# Patient Record
Sex: Female | Born: 1945 | Race: Black or African American | Hispanic: No | Smoking: Never smoker
Health system: Southern US, Community
[De-identification: ages and names within clinical notes are randomized; demographics above are authoritative.]

## PROBLEM LIST (undated history)

## (undated) DIAGNOSIS — M1990 Unspecified osteoarthritis, unspecified site: Secondary | ICD-10-CM

## (undated) DIAGNOSIS — I639 Cerebral infarction, unspecified: Secondary | ICD-10-CM

## (undated) DIAGNOSIS — J069 Acute upper respiratory infection, unspecified: Secondary | ICD-10-CM

## (undated) DIAGNOSIS — T783XXA Angioneurotic edema, initial encounter: Secondary | ICD-10-CM

## (undated) DIAGNOSIS — Z9109 Other allergy status, other than to drugs and biological substances: Secondary | ICD-10-CM

## (undated) DIAGNOSIS — I10 Essential (primary) hypertension: Secondary | ICD-10-CM

## (undated) DIAGNOSIS — I2510 Atherosclerotic heart disease of native coronary artery without angina pectoris: Secondary | ICD-10-CM

## (undated) DIAGNOSIS — J45909 Unspecified asthma, uncomplicated: Secondary | ICD-10-CM

## (undated) DIAGNOSIS — E785 Hyperlipidemia, unspecified: Secondary | ICD-10-CM

## (undated) DIAGNOSIS — Z8679 Personal history of other diseases of the circulatory system: Secondary | ICD-10-CM

## (undated) HISTORY — PX: TUBAL LIGATION: SHX77

## (undated) HISTORY — DX: Essential (primary) hypertension: I10

## (undated) HISTORY — DX: Personal history of other diseases of the circulatory system: Z86.79

## (undated) HISTORY — DX: Unspecified osteoarthritis, unspecified site: M19.90

## (undated) HISTORY — DX: Acute upper respiratory infection, unspecified: J06.9

## (undated) HISTORY — PX: CARDIAC CATHETERIZATION: SHX172

## (undated) HISTORY — DX: Angioneurotic edema, initial encounter: T78.3XXA

## (undated) HISTORY — DX: Atherosclerotic heart disease of native coronary artery without angina pectoris: I25.10

## (undated) HISTORY — DX: Unspecified asthma, uncomplicated: J45.909

---

## 2003-06-27 ENCOUNTER — Ambulatory Visit (HOSPITAL_COMMUNITY): Admission: RE | Admit: 2003-06-27 | Discharge: 2003-06-27 | Payer: Self-pay | Admitting: Obstetrics

## 2003-10-13 ENCOUNTER — Ambulatory Visit (HOSPITAL_COMMUNITY): Admission: RE | Admit: 2003-10-13 | Discharge: 2003-10-13 | Payer: Self-pay | Admitting: Obstetrics

## 2003-10-13 ENCOUNTER — Encounter (INDEPENDENT_AMBULATORY_CARE_PROVIDER_SITE_OTHER): Payer: Self-pay | Admitting: *Deleted

## 2003-10-22 ENCOUNTER — Inpatient Hospital Stay (HOSPITAL_COMMUNITY): Admission: EM | Admit: 2003-10-22 | Discharge: 2003-10-25 | Payer: Self-pay | Admitting: Emergency Medicine

## 2003-10-23 ENCOUNTER — Encounter (INDEPENDENT_AMBULATORY_CARE_PROVIDER_SITE_OTHER): Payer: Self-pay | Admitting: Interventional Cardiology

## 2003-10-30 ENCOUNTER — Inpatient Hospital Stay (HOSPITAL_COMMUNITY): Admission: AD | Admit: 2003-10-30 | Discharge: 2003-10-30 | Payer: Self-pay

## 2003-12-12 ENCOUNTER — Ambulatory Visit: Admission: RE | Admit: 2003-12-12 | Discharge: 2003-12-12 | Payer: Self-pay | Admitting: Internal Medicine

## 2003-12-15 ENCOUNTER — Inpatient Hospital Stay (HOSPITAL_COMMUNITY): Admission: RE | Admit: 2003-12-15 | Discharge: 2003-12-21 | Payer: Self-pay | Admitting: Obstetrics

## 2003-12-15 ENCOUNTER — Encounter (INDEPENDENT_AMBULATORY_CARE_PROVIDER_SITE_OTHER): Payer: Self-pay | Admitting: Specialist

## 2003-12-18 ENCOUNTER — Encounter: Payer: Self-pay | Admitting: Cardiology

## 2003-12-20 ENCOUNTER — Encounter: Payer: Self-pay | Admitting: Cardiovascular Disease

## 2003-12-20 ENCOUNTER — Encounter: Payer: Self-pay | Admitting: Neurology

## 2003-12-24 ENCOUNTER — Inpatient Hospital Stay (HOSPITAL_COMMUNITY): Admission: AD | Admit: 2003-12-24 | Discharge: 2003-12-24 | Payer: Self-pay | Admitting: Obstetrics & Gynecology

## 2004-02-05 ENCOUNTER — Encounter: Admission: RE | Admit: 2004-02-05 | Discharge: 2004-05-05 | Payer: Self-pay | Admitting: Neurology

## 2004-02-29 ENCOUNTER — Encounter: Admission: RE | Admit: 2004-02-29 | Discharge: 2004-02-29 | Payer: Self-pay | Admitting: Obstetrics

## 2004-12-25 ENCOUNTER — Emergency Department (HOSPITAL_COMMUNITY): Admission: EM | Admit: 2004-12-25 | Discharge: 2004-12-25 | Payer: Self-pay | Admitting: Emergency Medicine

## 2005-03-12 ENCOUNTER — Emergency Department (HOSPITAL_COMMUNITY): Admission: EM | Admit: 2005-03-12 | Discharge: 2005-03-12 | Payer: Self-pay | Admitting: Emergency Medicine

## 2005-07-29 ENCOUNTER — Ambulatory Visit: Payer: Self-pay | Admitting: Internal Medicine

## 2005-09-09 ENCOUNTER — Ambulatory Visit (HOSPITAL_COMMUNITY): Admission: RE | Admit: 2005-09-09 | Discharge: 2005-09-09 | Payer: Self-pay | Admitting: Obstetrics

## 2006-09-07 ENCOUNTER — Ambulatory Visit: Payer: Self-pay | Admitting: Internal Medicine

## 2006-09-07 LAB — CONVERTED CEMR LAB
ALT: 25 units/L (ref 0–40)
AST: 24 units/L (ref 0–37)
Albumin: 3.6 g/dL (ref 3.5–5.2)
Alkaline Phosphatase: 93 units/L (ref 39–117)
BUN: 8 mg/dL (ref 6–23)
CO2: 35 meq/L — ABNORMAL HIGH (ref 19–32)
Calcium: 9.2 mg/dL (ref 8.4–10.5)
Chloride: 106 meq/L (ref 96–112)
Creatinine, Ser: 0.9 mg/dL (ref 0.4–1.2)
GFR calc non Af Amer: 68 mL/min
Glomerular Filtration Rate, Af Am: 83 mL/min/{1.73_m2}
Glucose, Bld: 97 mg/dL (ref 70–99)
HCT: 37.5 % (ref 36.0–46.0)
Hemoglobin: 12.4 g/dL (ref 12.0–15.0)
MCHC: 33.1 g/dL (ref 30.0–36.0)
MCV: 82.1 fL (ref 78.0–100.0)
Platelets: 171 10*3/uL (ref 150–400)
Potassium: 4.1 meq/L (ref 3.5–5.1)
RBC: 4.57 M/uL (ref 3.87–5.11)
RDW: 14.1 % (ref 11.5–14.6)
Sodium: 145 meq/L (ref 135–145)
Total Bilirubin: 0.7 mg/dL (ref 0.3–1.2)
Total Protein: 6.1 g/dL (ref 6.0–8.3)
WBC: 5.2 10*3/uL (ref 4.5–10.5)

## 2007-04-15 ENCOUNTER — Encounter (INDEPENDENT_AMBULATORY_CARE_PROVIDER_SITE_OTHER): Payer: Self-pay

## 2007-04-16 ENCOUNTER — Ambulatory Visit: Payer: Self-pay | Admitting: Internal Medicine

## 2007-04-16 DIAGNOSIS — I10 Essential (primary) hypertension: Secondary | ICD-10-CM

## 2007-04-16 DIAGNOSIS — Z8679 Personal history of other diseases of the circulatory system: Secondary | ICD-10-CM | POA: Insufficient documentation

## 2007-04-16 DIAGNOSIS — J45909 Unspecified asthma, uncomplicated: Secondary | ICD-10-CM

## 2007-04-16 DIAGNOSIS — T783XXA Angioneurotic edema, initial encounter: Secondary | ICD-10-CM | POA: Insufficient documentation

## 2007-04-16 HISTORY — DX: Angioneurotic edema, initial encounter: T78.3XXA

## 2007-04-16 HISTORY — DX: Unspecified asthma, uncomplicated: J45.909

## 2007-04-16 HISTORY — DX: Essential (primary) hypertension: I10

## 2007-04-16 HISTORY — DX: Personal history of other diseases of the circulatory system: Z86.79

## 2007-04-20 ENCOUNTER — Telehealth (INDEPENDENT_AMBULATORY_CARE_PROVIDER_SITE_OTHER): Payer: Self-pay | Admitting: *Deleted

## 2007-05-04 ENCOUNTER — Ambulatory Visit: Payer: Self-pay | Admitting: Internal Medicine

## 2007-05-10 ENCOUNTER — Ambulatory Visit: Payer: Self-pay | Admitting: Internal Medicine

## 2007-05-10 DIAGNOSIS — IMO0002 Reserved for concepts with insufficient information to code with codable children: Secondary | ICD-10-CM

## 2007-05-11 ENCOUNTER — Telehealth (INDEPENDENT_AMBULATORY_CARE_PROVIDER_SITE_OTHER): Payer: Self-pay | Admitting: *Deleted

## 2007-05-14 ENCOUNTER — Ambulatory Visit: Payer: Self-pay | Admitting: Internal Medicine

## 2007-05-14 DIAGNOSIS — L508 Other urticaria: Secondary | ICD-10-CM | POA: Insufficient documentation

## 2007-06-14 ENCOUNTER — Ambulatory Visit: Payer: Self-pay | Admitting: Internal Medicine

## 2007-07-02 ENCOUNTER — Encounter: Payer: Self-pay | Admitting: Internal Medicine

## 2007-12-16 ENCOUNTER — Ambulatory Visit: Payer: Self-pay | Admitting: Internal Medicine

## 2007-12-16 DIAGNOSIS — R51 Headache: Secondary | ICD-10-CM

## 2007-12-16 DIAGNOSIS — R519 Headache, unspecified: Secondary | ICD-10-CM | POA: Insufficient documentation

## 2007-12-24 ENCOUNTER — Encounter: Payer: Self-pay | Admitting: Internal Medicine

## 2008-08-02 ENCOUNTER — Telehealth: Payer: Self-pay | Admitting: Internal Medicine

## 2008-08-09 ENCOUNTER — Ambulatory Visit: Payer: Self-pay | Admitting: Cardiology

## 2008-08-09 ENCOUNTER — Telehealth: Payer: Self-pay | Admitting: Internal Medicine

## 2008-08-09 ENCOUNTER — Ambulatory Visit: Payer: Self-pay | Admitting: Internal Medicine

## 2008-08-09 ENCOUNTER — Inpatient Hospital Stay (HOSPITAL_COMMUNITY): Admission: EM | Admit: 2008-08-09 | Discharge: 2008-08-15 | Payer: Self-pay | Admitting: Emergency Medicine

## 2008-08-10 ENCOUNTER — Encounter: Payer: Self-pay | Admitting: Internal Medicine

## 2008-08-12 ENCOUNTER — Ambulatory Visit: Payer: Self-pay | Admitting: Vascular Surgery

## 2008-08-12 ENCOUNTER — Encounter: Payer: Self-pay | Admitting: Internal Medicine

## 2008-08-29 ENCOUNTER — Ambulatory Visit: Payer: Self-pay | Admitting: Internal Medicine

## 2008-08-29 DIAGNOSIS — I2510 Atherosclerotic heart disease of native coronary artery without angina pectoris: Secondary | ICD-10-CM

## 2008-08-29 HISTORY — DX: Atherosclerotic heart disease of native coronary artery without angina pectoris: I25.10

## 2008-10-09 ENCOUNTER — Ambulatory Visit: Payer: Self-pay | Admitting: Internal Medicine

## 2009-01-10 ENCOUNTER — Ambulatory Visit: Payer: Self-pay | Admitting: Internal Medicine

## 2009-04-11 ENCOUNTER — Ambulatory Visit: Payer: Self-pay | Admitting: Internal Medicine

## 2009-09-12 ENCOUNTER — Other Ambulatory Visit: Admission: RE | Admit: 2009-09-12 | Discharge: 2009-09-12 | Payer: Self-pay | Admitting: Internal Medicine

## 2009-09-12 ENCOUNTER — Ambulatory Visit: Payer: Self-pay | Admitting: Internal Medicine

## 2009-09-12 LAB — CONVERTED CEMR LAB
ALT: 22 units/L (ref 0–35)
AST: 24 units/L (ref 0–37)
Albumin: 3.8 g/dL (ref 3.5–5.2)
Alkaline Phosphatase: 90 units/L (ref 39–117)
BUN: 16 mg/dL (ref 6–23)
Basophils Absolute: 0 10*3/uL (ref 0.0–0.1)
Basophils Relative: 0.5 % (ref 0.0–3.0)
Bilirubin, Direct: 0 mg/dL (ref 0.0–0.3)
CO2: 31 meq/L (ref 19–32)
Calcium: 9.2 mg/dL (ref 8.4–10.5)
Chloride: 106 meq/L (ref 96–112)
Cholesterol: 195 mg/dL (ref 0–200)
Creatinine, Ser: 0.8 mg/dL (ref 0.4–1.2)
Eosinophils Absolute: 0.3 10*3/uL (ref 0.0–0.7)
Eosinophils Relative: 5.7 % — ABNORMAL HIGH (ref 0.0–5.0)
GFR calc non Af Amer: 93.64 mL/min (ref >60)
Glucose, Bld: 92 mg/dL (ref 70–99)
HCT: 38.8 % (ref 36.0–46.0)
HDL: 45.3 mg/dL (ref >39.00)
Hemoglobin: 12.4 g/dL (ref 12.0–15.0)
LDL Cholesterol: 133 mg/dL — ABNORMAL HIGH (ref 0–99)
Lymphocytes Relative: 38.9 % (ref 12.0–46.0)
Lymphs Abs: 1.8 10*3/uL (ref 0.7–4.0)
MCHC: 32.1 g/dL (ref 30.0–36.0)
MCV: 86.3 fL (ref 78.0–100.0)
Monocytes Absolute: 0.3 10*3/uL (ref 0.1–1.0)
Monocytes Relative: 7.5 % (ref 3.0–12.0)
Neutro Abs: 2.2 10*3/uL (ref 1.4–7.7)
Neutrophils Relative %: 47.4 % (ref 43.0–77.0)
Platelets: 140 10*3/uL — ABNORMAL LOW (ref 150.0–400.0)
Potassium: 3.9 meq/L (ref 3.5–5.1)
RBC: 4.5 M/uL (ref 3.87–5.11)
RDW: 14.7 % — ABNORMAL HIGH (ref 11.5–14.6)
Sodium: 142 meq/L (ref 135–145)
TSH: 0.7 microintl units/mL (ref 0.35–5.50)
Total Bilirubin: 0.9 mg/dL (ref 0.3–1.2)
Total CHOL/HDL Ratio: 4
Total Protein: 6.7 g/dL (ref 6.0–8.3)
Triglycerides: 83 mg/dL (ref 0.0–149.0)
VLDL: 16.6 mg/dL (ref 0.0–40.0)
WBC: 4.6 10*3/uL (ref 4.5–10.5)

## 2009-09-19 ENCOUNTER — Encounter (INDEPENDENT_AMBULATORY_CARE_PROVIDER_SITE_OTHER): Payer: Self-pay | Admitting: *Deleted

## 2009-10-02 ENCOUNTER — Encounter (INDEPENDENT_AMBULATORY_CARE_PROVIDER_SITE_OTHER): Payer: Self-pay | Admitting: *Deleted

## 2009-10-03 ENCOUNTER — Ambulatory Visit: Payer: Self-pay | Admitting: Gastroenterology

## 2009-10-12 ENCOUNTER — Telehealth: Payer: Self-pay | Admitting: Gastroenterology

## 2009-10-15 ENCOUNTER — Ambulatory Visit: Payer: Self-pay | Admitting: Gastroenterology

## 2009-10-17 ENCOUNTER — Encounter: Payer: Self-pay | Admitting: Gastroenterology

## 2009-10-25 ENCOUNTER — Ambulatory Visit: Payer: Self-pay | Admitting: Internal Medicine

## 2009-10-25 DIAGNOSIS — J069 Acute upper respiratory infection, unspecified: Secondary | ICD-10-CM | POA: Insufficient documentation

## 2009-10-25 HISTORY — DX: Acute upper respiratory infection, unspecified: J06.9

## 2010-09-14 ENCOUNTER — Encounter: Payer: Self-pay | Admitting: Obstetrics

## 2010-09-15 ENCOUNTER — Encounter: Payer: Self-pay | Admitting: Internal Medicine

## 2010-09-24 NOTE — Assessment & Plan Note (Signed)
Summary: pt will come in fasting/njr/pt rsc/cjr   Vital Signs:  Patient profile:   65 year old female Weight:      240 pounds BP sitting:   130 / 80  (left arm) Cuff size:   large  Vitals Entered By: Raechel Ache, RN (September 12, 2009 9:24 AM) CC: OV, fasting.   CC:  OV and fasting.Marland Kitchen  History of Present Illness: 65 year old patient who is seen today for a wellness exam.  She has a history of coronary artery disease and status post non-ST segment elevation MI approximately 1 year ago.  Heart catheterization revealed minimal coronary artery disease.  She apparently is not on simvastatin.  She has treated hypertension and also a history of asthma.  She has a long history of chronic urticaria and occasional episodes of angioedema.  Her asthma has been stable.  She has a history of diminished visual acuity at workset.  [employer] in [place].  Preventive Screening-Counseling & Management  Caffeine-Diet-Exercise     Does Patient Exercise: no  Allergies: 1)  ! * Antibiotics  Past History:  Past Medical History: Asthma Hypertension Cerebrovascular accident, hx of Headache non-ST segment elevation MI 12 2009 minimal coronary artery disease statin therapy diminished visual acuity  Past Surgical History: G3 P3, A0,  heart catheterization 12 2009 revealing minimal coronary artery disease  colonoscopy-no prior  Family History: Reviewed history from 05/04/2007 and no changes required. father died age 64 drowning death.  Mother died age 48 outpatient to diabetes and breast cancer two brothers 5 sisters and positive forl hypertension and breast cancer, CAD  Social History: Reviewed history and no changes required. Married Regular exercise-no 3 children, 7 g-children works at [employer] in [place] Patient Exercise:  no  Review of Systems  The patient denies anorexia, fever, weight loss, weight gain, vision loss, decreased  hearing, hoarseness, chest pain, syncope, dyspnea on exertion, peripheral edema, prolonged cough, headaches, hemoptysis, abdominal pain, melena, hematochezia, severe indigestion/heartburn, hematuria, incontinence, genital sores, muscle weakness, suspicious skin lesions, transient blindness, difficulty walking, depression, unusual weight change, abnormal bleeding, enlarged lymph nodes, angioedema, and breast masses.         no recent mammogram  Physical Exam  General:  overweight-appearing. blood pressure is 140/80 Head:  diffuse thinning of her hair, most marked on the vertex Eyes:  diminished visual acuity  strabismus noted; there is external deviation of the right eye Ears:  External ear exam shows no significant lesions or deformities.  Otoscopic examination reveals clear canals, tympanic membranes are intact bilaterally without bulging, retraction, inflammation or discharge. Hearing is grossly normal bilaterally. Nose:  External nasal examination shows no deformity or inflammation. Nasal mucosa are pink and moist without lesions or exudates. Mouth:  unremarkable except for multiple missing teeth Neck:  No deformities, masses, or tenderness noted. Chest Wall:  No deformities, masses, or tenderness noted. Breasts:  No mass, nodules, thickening, tenderness, bulging, retraction, inflamation, nipple discharge or skin changes noted.   Lungs:  Normal respiratory effort, chest expands symmetrically. Lungs are clear to auscultation, no crackles or wheezes. Heart:  Normal rate and regular rhythm. S1 and S2 normal without gallop, murmur, click, rub or other extra sounds. Abdomen:  obese soft and nontender.  Lower midline scar.  No organomegaly or masses.  Bowel sounds normal Rectal:  No external abnormalities noted. Normal sphincter tone. No rectal masses or tenderness. Genitalia:  Normal introitus for age, no external lesions, no vaginal discharge, mucosa  pink and moist, no vaginal or cervical lesions,  no vaginal atrophy, no friaility or hemorrhage, normal uterus size and position, no adnexal masses or tenderness Msk:  No deformity or scoliosis noted of thoracic or lumbar spine.   Pulses:  faint Extremities:  No clubbing, cyanosis, edema, or deformity noted with normal full range of motion of all joints.   Neurologic:  alert & oriented X3, cranial nerves II-XII intact, strength normal in all extremities, sensation intact to pinprick, and gait normal.  alert & oriented X3, cranial nerves II-XII intact, strength normal in all extremities, sensation intact to pinprick, and gait normal.   Skin:  Intact without suspicious lesions or rashes Cervical Nodes:  No lymphadenopathy noted Axillary Nodes:  No palpable lymphadenopathy Inguinal Nodes:  No significant adenopathy Psych:  Cognition and judgment appear intact. Alert and cooperative with normal attention span and concentration. No apparent delusions, illusions, hallucinations   Impression & Recommendations:  Problem # 1:  Preventive Health Care (ICD-V70.0)  Orders: Venipuncture (52841) Prescription Created Electronically [id number]) Gastroenterology Referral (GI) TLB-Lipid Panel (80061-LIPID) TLB-BMP (Basic Metabolic Panel-BMET) (80048-METABOL) TLB-CBC Platelet - w/Differential (85025-CBCD) TLB-Hepatic/Liver Function Pnl (80076-HEPATIC) TLB-TSH (Thyroid Stimulating Hormone) (84443-TSH)  Complete Medication List: 1)  Advair Diskus 500-50 Mcg/dose Misc (Fluticasone-salmeterol) .... Use two times a day 2)  Clonidine Hcl 0.2 Mg Tabs (clonidine Hcl)  .... One  tablet at bedtime 3)  Lasix 20 Mg Tabs (Furosemide) .Marland Kitchen.. 1 once daily 4)  Baby Aspirin 81 Mg Chew (Aspirin) .Marland Kitchen.. 1 once daily 5)  Azor 5-20 Mg Tabs (Amlodipine-olmesartan) .... One daily in the morning 6)  Proair Hfa 108 (90 Base) Mcg/act Aers (Albuterol sulfate) .... Two puffs every 6 hours if needed for shortness of breath 7)  Simvastatin 20 Mg Tabs (Simvastatin) .... One daily  Other  Orders: EKG w/ Interpretation (93000)  Patient Instructions: 1)  Please schedule a follow-up appointment in 6 months. 2)  Limit your Sodium (Salt). 3)  It is important that you exercise regularly at least 20 minutes 5 times a week. If you develop chest pain, have severe difficulty breathing, or feel very tired , stop exercising immediately and seek medical attention. 4)  You need to lose weight. Consider a lower calorie diet and regular exercise.  5)  Schedule your mammogram. 6)  Schedule a colonoscopy/sigmoidoscopy to help detect colon cancer. 7)  Take calcium +Vitamin D daily. Prescriptions: SIMVASTATIN 20 MG TABS (SIMVASTATIN) one daily  #90 x 6   Entered and Authorized by:   Gordy Savers  MD   Signed by:   Gordy Savers  MD on 09/12/2009   Method used:   Print then Give to Patient   RxID:   [id number] PROAIR HFA 108 (90 BASE) MCG/ACT AERS (ALBUTEROL SULFATE) two puffs every 6 hours if needed for shortness of breath  #3 x 6   Entered and Authorized by:   Gordy Savers  MD   Signed by:   Gordy Savers  MD on 09/12/2009   Method used:   Print then Give to Patient   RxID:   [id number] AZOR 5-20 MG TABS (AMLODIPINE-OLMESARTAN) one daily in the morning  #90 x 6   Entered and Authorized by:   Gordy Savers  MD   Signed by:   Gordy Savers  MD on 09/12/2009   Method used:   Print then Give to Patient   RxID:   [id number] LASIX 20 MG  TABS (FUROSEMIDE) 1 once daily  #180 x 6  Entered and Authorized by:   Gordy Savers  MD   Signed by:   Gordy Savers  MD on 09/12/2009   Method used:   Print then Give to Patient   RxID:   [id number] ADVAIR DISKUS 500-50 MCG/DOSE MISC (FLUTICASONE-SALMETEROL) use two times a day  #3 x 3   Entered and Authorized by:   Gordy Savers  MD   Signed by:   Gordy Savers  MD on 09/12/2009   Method used:   Print then Give to Patient   RxID:   [id number] SIMVASTATIN 20 MG  TABS (SIMVASTATIN) one daily  #90 x 6   Entered and Authorized by:   Gordy Savers  MD   Signed by:   Gordy Savers  MD on 09/12/2009   Method used:   [facility]        [facility] [phone]* (retail)       [address], [place]  [address]       Ph: [phone] or [phone]       Fax: [phone]   RxID:   [id number] PROAIR HFA 108 (90 BASE) MCG/ACT AERS (ALBUTEROL SULFATE) two puffs every 6 hours if needed for shortness of breath  #3 x 6   Entered and Authorized by:   Gordy Savers  MD   Signed by:   Gordy Savers  MD on 09/12/2009   Method used:   [facility]ronically to        [facility] [phone]* (retail)       [address]       [place], [place]  [address]       Ph: [phone] or [phone]       Fax: [phone]   RxID:   [id number] AZOR 5-20 MG TABS (AMLODIPINE-OLMESARTAN) one daily in the morning  #90 x 6   Entered and Authorized by:   Gordy Savers  MD   Signed by:   Gordy Savers  MD on 09/12/2009   Method used:   [facility]        [facility] [phone]* (retail)       [address]       [place], [place]  [address]       Ph: [phone] or [phone]       Fax: [phone]   RxID:   [id number] LASIX 20 MG  TABS (FUROSEMIDE) 1 once daily  #180 x 6   Entered and Authorized by:   Gordy Savers  MD   Signed by:   Gordy Savers  MD on 09/12/2009   Method used:   [facility]ronically to        [facility] [phone]* (retail)       [address], [place]  [address]       Ph: [phone] or [phone]       Fax: [phone]   RxID:   [id number] CLONIDINE HCL 0.2 MG  TABS (CLONIDINE HCL) one  tablet at bedtime  #90 x 6   Entered and Authorized by:   Gordy Savers  MD   Signed by:   Gordy Savers  MD on 09/12/2009   Method used:   Print then Give to Patient   RxID:   [id number] ADVAIR DISKUS 500-50 MCG/DOSE MISC (FLUTICASONE-SALMETEROL) use  two times  a day  #3 x 3   Entered and Authorized by:   Gordy Savers  MD   Signed by:   Gordy Savers  MD on 09/12/2009   Method used:   [facility] to        [facility] [phone]* (retail)       [address]       [place], [place]  [address]       Ph: [phone] or [phone]       Fax: [phone]   RxID:   [id number]

## 2010-09-24 NOTE — Procedures (Signed)
Summary: Colonoscopy  Patient: [name] Note: All result statuses are Final unless otherwise noted.  Tests: (1) Colonoscopy (COL)   COL Colonoscopy           DONE     [facility]     [address]     [place], [place]  [address]           COLONOSCOPY PROCEDURE REPORT           PATIENT:  [name], [name]  MR#:  [id number]     BIRTHDATE:  02/23/1946, 63 yrs. old  GENDER:  female           ENDOSCOPIST:  Vania Rea. Jarold Motto, MD, [facility]     Referred by:  Eleonore Chiquito, M.D.           PROCEDURE DATE:  10/15/2009     PROCEDURE:  Colonoscopy with snare polypectomy     ASA CLASS:  Class II     INDICATIONS:  Routine Risk Screening           MEDICATIONS:   Fentanyl 75 mcg IV, Versed 8 mg IV           DESCRIPTION OF PROCEDURE:   After the risks benefits and     alternatives of the procedure were thoroughly explained, informed     consent was obtained.  Digital rectal exam was performed and     revealed no abnormalities.   The LB [id number] endoscope     was introduced through the anus and advanced to the cecum, which     was identified by both the appendix and ileocecal valve, without     limitations.  The quality of the prep was excellent, using     MoviPrep.  The instrument was then slowly withdrawn as the colon     was fully examined.     <<PROCEDUREIMAGES>>           FINDINGS:  A sessile polyp was found. FLAT POLYP POLYP COLD     SNARE EXCISED.  This was otherwise a normal examination of the     colon.   Retroflexed views in the rectum revealed no     abnormalities.    The scope was then withdrawn from the patient     and the procedure completed.           COMPLICATIONS:  None           ENDOSCOPIC IMPRESSION:     1) Sessile polyp     2) Otherwise normal examination     R/O ADENOMA     RECOMMENDATIONS:     1) If the polyp(s) removed today are proven to be adenomatous     (pre-cancerous) polyps, you will need a repeat colonoscopy in 5  years. Otherwise you should continue to follow colorectal cancer     screening guidelines for "routine risk" patients with colonoscopy     in 10 years.           REPEAT EXAM:  No           ______________________________     Vania Rea. Jarold Motto, MD, [facility]           CC:           n.     eSIGNED:   Vania Rea. Patterson at 10/15/2009 11:52 AM           Effie Berkshire, [id number]  Note: An exclamation mark (!) indicates a result that  was not dispersed into the flowsheet. Document Creation Date: 10/15/2009 11:52 AM _______________________________________________________________________  (1) Order result status: Final Collection or observation date-time: 10/15/2009 11:46 Requested date-time:  Receipt date-time:  Reported date-time:  Referring Physician:   Ordering Physician: Sheryn Bison [phone]) Specimen Source:  Source: Launa Grill Order Number: [id number] Lab site:   Appended Document: Colonoscopy     Procedures Next Due Date:    Colonoscopy: 10/2014

## 2010-09-24 NOTE — Letter (Signed)
Summary: [facility] Instructions  [facility]  [address] [place], [place] [address]   Phone: [phone]  Fax: [phone]       [name]    20-Jun-1946    MRN: [id number]        Procedure Day /Date: 10/15/09  Monday     Arrival Time: 10:00am     Procedure Time: 11:00am     Location of Procedure:                    _x _  [facility] (4th Floor)                        PREPARATION FOR COLONOSCOPY WITH MOVIPREP   Starting 5 days prior to your procedure _ 2/16/11_ do not eat nuts, seeds, popcorn, corn, beans, peas,  salads, or any raw vegetables.  Do not take any fiber supplements (e.g. Metamucil, Citrucel, and Benefiber).  THE DAY BEFORE YOUR PROCEDURE         DATE:  10/14/09   DAY: Sunday  1.  Drink clear liquids the entire day-NO SOLID FOOD  2.  Do not drink anything colored red or purple.  Avoid juices with pulp.  No orange juice.  3.  Drink at least 64 oz. (8 glasses) of fluid/clear liquids during the day to prevent dehydration and help the prep work efficiently.  CLEAR LIQUIDS INCLUDE: Water Jello Ice Popsicles Tea (sugar ok, no milk/cream) Powdered fruit flavored drinks Coffee (sugar ok, no milk/cream) Gatorade Juice: apple, white grape, white cranberry  Lemonade Clear bullion, consomm, broth Carbonated beverages (any kind) Strained chicken noodle soup Hard Candy                             4.  In the morning, mix first dose of MoviPrep solution:    Empty 1 Pouch A and 1 Pouch B into the disposable container    Add lukewarm drinking water to the top line of the container. Mix to dissolve    Refrigerate (mixed solution should be used within 24 hrs)  5.  Begin drinking the prep at 5:00 p.m. The MoviPrep container is divided by 4 marks.   Every 15 minutes drink the solution down to the next mark (approximately 8 oz) until the full liter is complete.   6.  Follow completed prep with 16 oz of clear liquid of your choice  (Nothing red or purple).  Continue to drink clear liquids until bedtime.  7.  Before going to bed, mix second dose of MoviPrep solution:    Empty 1 Pouch A and 1 Pouch B into the disposable container    Add lukewarm drinking water to the top line of the container. Mix to dissolve    Refrigerate  THE DAY OF YOUR PROCEDURE      DATE:  10/15/09  DAY:  Monday  Beginning at  6:00a.m. (5 hours before procedure):         1. Every 15 minutes, drink the solution down to the next mark (approx 8 oz) until the full liter is complete.  2. Follow completed prep with 16 oz. of clear liquid of your choice.    3. You may drink clear liquids until  9:00am (2 HOURS BEFORE PROCEDURE).   MEDICATION INSTRUCTIONS  Unless otherwise instructed, you should take regular prescription medications with a small sip of water   as early  as possible the morning of your procedure.         OTHER INSTRUCTIONS  You will need a responsible adult at least 63 years of age to accompany you and drive you home.   This person must remain in the waiting room during your procedure.  Wear loose fitting clothing that is easily removed.  Leave jewelry and other valuables at home.  However, you may wish to bring a book to read or  an iPod/MP3 player to listen to music as you wait for your procedure to start.  Remove all body piercing jewelry and leave at home.  Total time from sign-in until discharge is approximately 2-3 hours.  You should go home directly after your procedure and rest.  You can resume normal activities the  day after your procedure.  The day of your procedure you should not:   Drive   Make legal decisions   Operate machinery   Drink alcohol   Return to work  You will receive specific instructions about eating, activities and medications before you leave.    The above instructions have been reviewed and explained to me by   Wyona Almas RN  October 03, 2009 11:41 AM     I fully  understand and can verbalize these instructions _____________________________ Date _________

## 2010-09-24 NOTE — Assessment & Plan Note (Signed)
Summary: BILATERAL LEG EDEMA // RS   Vital Signs:  Patient profile:   65 year old female Weight:      240 pounds O2 Sat:      94 % on Room air Temp:     99.0 degrees F oral BP sitting:   130 / 70  (right arm) Cuff size:   large  Vitals Entered By: Duard Brady LPN (October 25, 6765 9:13 AM)  O2 Flow:  Room air CC: c/o congestion, sob, sore throat - pt requesting nebulizer machine Is Patient Diabetic? No   CC:  c/o congestion, sob, and sore throat - pt requesting nebulizer machine.  History of Present Illness: 65 year old patient who has a history of asthma, hypertension, coronary artery disease, and cerebrovascular disease.  She is on simvastatin for dyslipidemia.  For the past few days.  She has had URI symptoms with sore throat and sinus congestion.  She denies any wheezing or productive cough.  Denies any fever or chills.  She has treated hypertension, which has been stable.  Denies any wheezing  Preventive Screening-Counseling & Management  Alcohol-Tobacco     Smoking Status: never  Allergies: 1)  ! * Antibiotics  Past History:  Past Medical History: Reviewed history from 09/12/2009 and no changes required. Asthma Hypertension Cerebrovascular accident, hx of Headache non-ST segment elevation MI 12 2009 minimal coronary artery disease statin therapy diminished visual acuity  Past Surgical History: Reviewed history from 09/12/2009 and no changes required. G3 P3, A0,  heart catheterization 12 2009 revealing minimal coronary artery disease  colonoscopy-no prior  Social History: Smoking Status:  never  Review of Systems       The patient complains of anorexia and hoarseness.  The patient denies fever, weight loss, weight gain, vision loss, decreased hearing, chest pain, syncope, dyspnea on exertion, peripheral edema, prolonged cough, headaches, hemoptysis, abdominal pain, melena, hematochezia, severe indigestion/heartburn, hematuria, incontinence, genital  sores, muscle weakness, suspicious skin lesions, transient blindness, difficulty walking, depression, unusual weight change, abnormal bleeding, enlarged lymph nodes, angioedema, and breast masses.    Physical Exam  General:  overweight-appearing.  130/80 Head:  Normocephalic and atraumatic without obvious abnormalities. No apparent alopecia or balding. Eyes:  No corneal or conjunctival inflammation noted. EOMI. Perrla. Funduscopic exam benign, without hemorrhages, exudates or papilledema. Vision grossly normal. Ears:  External ear exam shows no significant lesions or deformities.  Otoscopic examination reveals clear canals, tympanic membranes are intact bilaterally without bulging, retraction, inflammation or discharge. Hearing is grossly normal bilaterally. Mouth:  pharyngeal erythema.   Neck:  No deformities, masses, or tenderness noted. Lungs:  Normal respiratory effort, chest expands symmetrically. Lungs are clear to auscultation, no crackles or wheezes. O2 saturation 96% Heart:  Normal rate and regular rhythm. S1 and S2 normal without gallop, murmur, click, rub or other extra sounds.rate in the mid 60s Abdomen:  Bowel sounds positive,abdomen soft and non-tender without masses, organomegaly or hernias noted. Msk:  No deformity or scoliosis noted of thoracic or lumbar spine.   Pulses:  R and L carotid,radial,femoral,dorsalis pedis and posterior tibial pulses are full and equal bilaterally Extremities:  No clubbing, cyanosis, edema, or deformity noted with normal full range of motion of all joints.     Impression & Recommendations:  Problem # 1:  HYPERTENSION (ICD-401.9)  Her updated medication list for this problem includes:    Lasix 20 Mg Tabs (Furosemide) .Marland Kitchen... 1 once daily    Azor 5-20 Mg Tabs (Amlodipine-olmesartan) ..... One daily in the  morning  Problem # 2:  ASTHMA (ICD-493.90)  Her updated medication list for this problem includes:    Advair Diskus 500-50 Mcg/dose Misc  (Fluticasone-salmeterol) ..... Use two times a day    Proair Hfa 108 (90 Base) Mcg/act Aers (Albuterol sulfate) .Marland Kitchen..Marland Kitchen Two puffs every 6 hours if needed for shortness of breath  Problem # 3:  CORONARY ARTERY DISEASE (ICD-414.00)  Her updated medication list for this problem includes:    Lasix 20 Mg Tabs (Furosemide) .Marland Kitchen... 1 once daily    Baby Aspirin 81 Mg Chew (Aspirin) .Marland Kitchen... 1 once daily    Azor 5-20 Mg Tabs (Amlodipine-olmesartan) ..... One daily in the morning  Problem # 4:  URI (ICD-465.9)  Her updated medication list for this problem includes:    Baby Aspirin 81 Mg Chew (Aspirin) .Marland Kitchen... 1 once daily  Complete Medication List: 1)  Advair Diskus 500-50 Mcg/dose Misc (Fluticasone-salmeterol) .... Use two times a day 2)  Clonidine Hcl 0.2 Mg Tabs (clonidine Hcl)  .... One  tablet at bedtime 3)  Lasix 20 Mg Tabs (Furosemide) .Marland Kitchen.. 1 once daily 4)  Baby Aspirin 81 Mg Chew (Aspirin) .Marland Kitchen.. 1 once daily 5)  Azor 5-20 Mg Tabs (Amlodipine-olmesartan) .... One daily in the morning 6)  Proair Hfa 108 (90 Base) Mcg/act Aers (Albuterol sulfate) .... Two puffs every 6 hours if needed for shortness of breath 7)  Simvastatin 20 Mg Tabs (Simvastatin) .... One daily  Patient Instructions: 1)  Get plenty of rest, drink lots of clear liquids, and use Tylenol or Ibuprofen for fever and comfort. Return in 7-10 days if you're not better:sooner if you're feeling worse. 2)  Please schedule a follow-up appointment in 3 months. 3)  Limit your Sodium (Salt). 4)  It is important that you exercise regularly at least 20 minutes 5 times a week. If you develop chest pain, have severe difficulty breathing, or feel very tired , stop exercising immediately and seek medical attention. 5)  You need to lose weight. Consider a lower calorie diet and regular exercise.

## 2010-09-24 NOTE — Miscellaneous (Signed)
Summary: [facility] Previsit/prep  Clinical Lists Changes  Medications: Added new medication of MOVIPREP 100 GM  SOLR (PEG-KCL-NACL-NASULF-NA ASC-C) As per prep instructions. - Signed Rx of MOVIPREP 100 GM  SOLR (PEG-KCL-NACL-NASULF-NA ASC-C) As per prep instructions.;  #1 x 0;  Signed;  Entered by: Wyona Almas RN;  Authorized by: Mardella Layman MD [facility];  Method used: [facility] [phone]*, [address] [place], [place]  [address], Ph: [phone] or [phone], Fax: [phone] Observations: Added new observation of ALLERGY REV: Done (10/03/2009 10:29)    Prescriptions: MOVIPREP 100 GM  SOLR (PEG-KCL-NACL-NASULF-NA ASC-C) As per prep instructions.  #1 x 0   Entered by:   Wyona Almas RN   Authorized by:   Mardella Layman MD [facility]   Signed by:   Wyona Almas RN on 10/03/2009   Method used:   [facility] to        [facility] [phone]* (retail)       [address]       [place], [place]  [address]       Ph: [phone] or [phone]       Fax: [phone]   RxID:   [id number]

## 2010-09-24 NOTE — Progress Notes (Signed)
Summary: prep ?'s  Phone Note Call from Patient Call back at Home Phone [phone]   Caller: Patient Call For: Dr. Jarold Motto Reason for Call: Talk to Nurse Summary of Call: pt has prep questions Initial call taken by: Vallarie Mare,  October 12, 2009 9:32 AM  Follow-up for Phone Call        Attempted to call patient  Got voivemail, LM attempted call and for pt to call us back. Follow-up by: Joylene John RN,  October 12, 2009 10:28 AM  Additional Follow-up for Phone Call Additional follow up Details #1::        Pt called and she questioned what did she mix her prep with for her procedure on Monday am. Instructed to mix with water and to follow all directions as per her previsit. Pt asked what can she eat on sunday and told her only clear liquids, no solid foods on sunday. Pt states she will have someone help her with her instructions and prep. Instructed to have that person go over her instructions with her on saturday. pt verbalized understanding of all above. Additional Follow-up by: Joylene John RN,  October 12, 2009 2:35 PM

## 2010-09-24 NOTE — Letter (Signed)
Summary: Patient Notice- Polyp Results  [facility]  [address] [place], [place] [address]   Phone: [phone]  Fax: [phone]        October 17, 2009 MRN: [id number]    [name] [address] [place], [place]  [address]    Dear [name],  I am pleased to inform you that the colon polyp(s) removed during your recent colonoscopy was (were) found to be benign (no cancer detected) upon pathologic examination.  I recommend you have a repeat colonoscopy examination in 5_ years to look for recurrent polyps, as having colon polyps increases your risk for having recurrent polyps or even colon cancer in the future.  Should you develop new or worsening symptoms of abdominal pain, bowel habit changes or bleeding from the rectum or bowels, please schedule an evaluation with either your primary care physician or with me.  Additional information/recommendations:  _x_ No further action with gastroenterology is needed at this time. Please      follow-up with your primary care physician for your other healthcare      needs.  __ Please call [phone] to schedule a return visit to review your      situation.  __ Please keep your follow-up visit as already scheduled.  __ Continue treatment plan as outlined the day of your exam.  Please call us if you are having persistent problems or have questions about your condition that have not been fully answered at this time.  Sincerely,  Mardella Layman MD [facility]  This letter has been electronically signed by your physician.  Appended Document: Patient Notice- Polyp Results  Letter mailed 2.24.11

## 2010-09-24 NOTE — Letter (Signed)
Summary: New Patient letter  [facility]  [address] [place], [place] [address]   Phone: [phone]  Fax: [phone]       09/19/2009 MRN: [id number]  [name] [address] [place], [place]  [address]  Dear [name],  Welcome to the Gastroenterology Division at [facility].    You are scheduled to see Dr. Jarold Motto on 10-03-09 at 11:00a.m. on the 3rd floor at [facility], [address]  We ask that you try to arrive at our office 15 minutes prior to your appointment time to allow for check-in.  We would like you to complete the enclosed self-administered evaluation form prior to your visit and bring it with you on the day of your appointment.  We will review it with you.  Also, please bring a complete list of all your medications or, if you prefer, bring the medication bottles and we will list them.  Please bring your insurance card so that we may make a copy of it.  If your insurance requires a referral to see a specialist, please bring your referral form from your primary care physician.  Co-payments are due at the time of your visit and may be paid by cash, check or credit card.     Your office visit will consist of a consult with your physician (includes a physical exam), any laboratory testing he/she may order, scheduling of any necessary diagnostic testing (e.g. x-ray, ultrasound, CT-scan), and scheduling of a procedure (e.g. Endoscopy, Colonoscopy) if required.  Please allow enough time on your schedule to allow for any/all of these possibilities.    If you cannot keep your appointment, please call [phone] to cancel or reschedule prior to your appointment date.  This allows [place] the opportunity to schedule an appointment for another patient in need of care.  If you do not cancel or reschedule by 5 p.m. the business day prior to your appointment date, you will be charged a $50.00 late cancellation/no-show fee.    Thank you  for choosing [facility] for your medical needs.  We appreciate the opportunity to care for you.  Please visit [place] at our website  to learn more about our practice.                     Sincerely,                                                             The Gastroenterology Division

## 2011-01-02 ENCOUNTER — Other Ambulatory Visit: Payer: Self-pay

## 2011-01-02 NOTE — Telephone Encounter (Signed)
Last seen - 10/2009  Must be seen for refills

## 2011-01-07 NOTE — Consult Note (Signed)
[name], TERRERO NO.:  [id number]   MEDICAL RECORD NO.:  [id number]          PATIENT TYPE:  INP   LOCATION:  2917                         FACILITY:  [facility]   PHYSICIAN:  Jesse Sans. Daleen Squibb, MD, FACCDATE OF BIRTH:  21-Nov-1945   DATE OF CONSULTATION:  DATE OF DISCHARGE:                                 CONSULTATION   PRIMARY CARE DOCTOR:  Gordy Savers, MD   PRIMARY CARDIOLOGIST:  The patient does not have a primary cardiologist  at this time.   We were consulted on this patient for elevated troponins.   HISTORY OF PRESENT ILLNESS:  The patient had increasing shortness of  breath and dyspnea on exertion to the point where she remained very  inactive over the last previous days prior to August 09, 2008.  She  had ran out of her medications 1-2 weeks prior to this and her shortness  of breath became significantly worse once she ran out of Advair, which  was 2 days actually before her admission.  She has currently no chest  pain nor has she ever had any chest pain or other associated symptoms.  Her shortness of breath now is much improved, but she still has some  shortness of breath and dyspnea on exertion.   PAST MEDICAL HISTORY:  1. The patient has a history of asthma.  2. Hypertension.  3. History of CVA.  4. Hypercholesterolemia.  5. History of uterine bleeding.  6. History of albinism.   ALLERGIES:  The patient has no known drug allergies, but is LACTOSE  intolerant.  Allergic to EGGS and LATEX.   MEDICATIONS:  1. She normal takes Advair  every day __________ mg.  2. Clonidine, unknown dose.  3. Hyzaar, unknown dose and unknown frequency.  4. Hydroxyzine, unknown dose and unknown frequency.  5. Lasix 20 mg p.o. daily.  6. She is unsure if she has been taking lisinopril, but has taken in      the past.  7. Takes Zyrtec daily.   SOCIAL HISTORY:  The patient lives in [place] with her daughter.  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  However, does not  do regular exercise.  She is a nonsmoker, social drinker, but  infrequent.  No illegal drugs.  No herbal medications, and follows just  a regular diet.   FAMILY HISTORY:  Mother is deceased at age 51 from complications from  diabetes mellitus.  Her father died of probable MI at the age of 35.  She has living siblings with no known history of coronary artery  disease.   REVIEW OF SYSTEMS:  The patient has had dyspnea on exertion and  currently still does, and orthopnea, minimally productive cough, and  significant wheezing.  She has had some weakness worse over the last few  weeks.  Diarrhea with incontinence for the last several months with no  change from that time.  In the last few weeks, she has had less appetite  and early satiety.  All other systems reviewed were negative.   PHYSICAL EXAMINATION:  VITAL SIGNS:  On  admission, her temperature was  98.4 degrees Fahrenheit, blood pressure was 207/104 and her pulse was  126.  Her most recent vital signs are temp of 97.9 degrees Fahrenheit,  pulse 93, respiration rate 20, blood pressure 151/50, and her oxygen  saturation 97% on 3 L by nasal cannula.  Her weight is 110 kg and her  height is 65 inches.  GENERAL:  She was alert and oriented x3, in no apparent distress.  She  is a pleasant African American female who is obese and has a history of  albinism.  HEENT:  She was normocephalic and atraumatic.  Pupils were equal, round,  and reactive to light.  Extraocular muscles intact.  Her nares were  patent and without discharge.  She had normal dentition with moist, pink  oral mucosa.  NECK:  Supple without lymphadenopathy.  No JVD.  No bruits.  No  thyromegaly.  Trachea was midline.  HEART:  Heart rate was regular rate and rhythm.  Audible S1 and S2  without murmurs, rubs, or gallops.  LUNGS:  Loud inspiratory and expiratory wheezing bilaterally.  Fairly  good air flow.  SKIN:  No rashes,  lesions, or petechiae.  ABDOMEN:  Soft, nontender.  Normal abdominal bowel sounds without  rebound or guarding.  No hepatosplenomegaly.  EXTREMITIES:  Pulses were 2+ and equal without bruits in upper and lower  extremities.  No clubbing, cyanosis, or edema.  MUSCULOSKELETAL:  No joint deformities.  No effusions.  No spinal  abnormalities noted.  No CVA tenderness.  NEUROLOGIC:  Cranial nerves II through XII were grossly intact and  strength was 5/5 in both, upper and lower extremities.  Normal sensation  throughout and normal cerebellar function.   The patient had a chest x-ray on August 09, 2008, that showed  cardiomegaly with pulmonary vascular congestion.  She had an echo  completed on August 10, 2008, results pending.   EKG showed sinus rhythm with a rate of 85, normal axis, no significant T-  waves, no ST-T ischemic changes, no significant changes from previous  EKG dated Dec 25, 2004, except slight increase in QRS and QTc.  The  patient's PR interval was 178, QRS 98, QTc 485, and no EKG evidence of  hypertrophy.   LABORATORY DATA:  WBC 9.8, HGB 12.9, HCT 40.8, and PLT count 180.  Sodium 141, potassium 3.5, chloride 104, CO2 of 29, BUN 11, creatinine  0.83, glucose 200, and calcium was 9.5.  Total protein was 6.5 and  albumin was 3.8.  BNP was 82.  The patient had 3 sets of cardiac  markers.  The first set was CK 649, CK-MB 6.6, and troponin I 0.03.  The  second set, CK was 756, the CK-MB was 6.8, and the troponin I was 0.04.  The third set was positive with the CK of 603, CK-MB of 21.7, and a  troponin I of 0.91.  The CK index was 3.6, which was elevated.  Total  cholesterol was 226, triglycerides was 30, HDL was 52, and LDL was 168.  PTT was 25, PT was 13.0, and INR was 1.0.  The patient had an urinalysis  that showed a pH of 7.5, specific gravity of 1.017, WBC 0-2, RBC 0-2,  leukocytes were negative, nitrites were negative, bacteria were few,  ketones negative, glucose was  100, and protein was 100.   ASSESSMENT:  This is a 65 year old female with history of asthma,  hypertension, hyperlipidemia, currently presenting with dyspnea on  admission, elevated glucose,  and a positive set of third cardiac enzymes  specifically elevated troponin I and an elevated CK-MB and index.  1. Elevated cardiac enzymes are likely secondary to hypertensive      crisis/sinus tachycardia, and asthmatic flare, which was secondary      to noncompliance with her medications.  2. The patient has multiple cardiac risk factors.   PLAN:  To continue her current medications with some changes in managing  her hypertension, we are going to followup with the 2-D echo that was  completed today, and plan for an outpatient Myoview pending the results  of the echo and any changes in her condition.  We are going to ask for a  case manager consult to help with the patient's compliance.  In regards  to the changes to her hypertension management, we are going to  discontinue lisinopril due to its possible contribution to shortness of  breath, and start verapamil 80 mg now with frequency of t.i.d.  thereafter.  We will continue clonidine 0.1 mg t.i.d. and Lasix 40 mg  for now.  We will also followup with labs drawn already to rule out for  a type 2 diabetes.      Jarrett Ables, PAC      Thomas C. Daleen Squibb, MD, [facility]  Electronically Signed    MS/MEDQ  D:  08/10/2008  T:  08/11/2008  Job:  [id number]

## 2011-01-07 NOTE — Discharge Summary (Signed)
NAMECARMELLIA, [name]           ACCOUNT NO.:  [id number]   MEDICAL RECORD NO.:  [id number]          PATIENT TYPE:  INP   LOCATION:  3715                         FACILITY:  [facility]   PHYSICIAN:  Valerie A. Felicity Coyer, MDDATE OF BIRTH:  [birth year]   DATE OF ADMISSION:  08/09/2008  DATE OF DISCHARGE:  08/15/2008                               DISCHARGE SUMMARY   PRIMARY CARE PHYSICIAN:  Gordy Savers, MD   DISCHARGE DIAGNOSES:  1. Acute asthma, chronic obstructive pulmonary disease exacerbation.  2. Non-ST-segment elevation myocardial infarction with demand ischemia      secondary to acute asthma, chronic obstructive pulmonary disease      exacerbation, mild coronary artery disease per cardiac      catheterization.  3. Hyperglycemia.  4. Medications noncompliance secondary to financial issues.   HISTORY OF PRESENT ILLNESS:  Ms. [name] is a 65 year old African  American female with past medical history of asthma, hypertension, and  CVA who presented to [facility] Emergency Room on the day of admission  with complaints of shortness of breath.  Of note, the patient reports  being out of her medications for approximately 1 week as she has not  been able to afford to refill them.  She states her insurance has  lapsed; however, she is due to be picked back up on August 25, 2008.  Again, the patient complained of cough with shortness of breath.  Denied  any fever or chills.  No nausea, vomiting, or abdominal pain.  Upon  evaluation in the emergency room, chest x-ray revealed cardiomegaly with  pulmonary congestion, otherwise workup was unremarkable.  Blood pressure  upon admission evaluation in emergency room was 207/104 due to the  patient's multiple medical problems.  The patient was admitted for  further evaluation and treatment.   PAST MEDICAL HISTORY:  1. Left thalamic CVA.  2. Hypertension.  3. Dysfunctional uterine bleeding secondary to fibroids, status post      D&C.  4.  Microcytic iron-deficiency anemia.  5. Dyslipidemia.  6. Asthma.  7. Legal blindness.  8. Albinism.  9. Diet-controlled diabetes.   CONSULTATION DURING THIS ADMISSION:  [facility].   PROCEDURE DURING THIS HOSPITALIZATION:  Cardiac catheterization on  August 11, 2008.   COURSE OF HOSPITALIZATION:  1. Acute asthma exacerbation.  Again, the patient was admitted from      the emergency room and placed on IV Solu-Medrol.  Due to the      patient's multiple medical problems, cardiac enzymes were obtained      revealing elevated CK and MBs.  Cardiology was asked to see the      patient in consultation at that time.  The patient's abnormal      cardiac enzymes felt likely secondary to hypertensive crisis and      asthmatic flare due to medication noncompliance.  The patient did      undergo cardiac catheterization on August 11, 2008, revealing      mild coronary artery disease.  Med management to be continued at      the time of discharge.  In regards to the patient's acute asthma  exacerbation, the patient's symptoms resolved with steroid taper.      She will continue p.o. prednisone at the time of discharge.  2. Hypertensive urgency.  Again, the patient noncompliant with      medications at the time of admission secondary to inability to      afford.  The patient's ACE inhibitor was discontinued during this      hospitalization secondary to acute airway disease.  The patient was      started on verapamil to be continued at the time of discharge.  The      patient to continue on clonidine and Lasix at the time of      discharge.  The patient's blood pressure stable at time of      dictation.  3. Hyperglycemia with questionable history of type 2 diabetes, diet      controlled.  A1c obtained during this admission 6.5.  Blood sugars      mildly elevated secondary to steroid use.  We will hold any new      medications at the time of discharge and defer the primary care       physician on followup.  4. Med noncompliance secondary to financial issues.  The patient was      seen in consultation by case manager during this admission.  The      patient will receive 3 days of free medications at the time of      discharge.  In addition, the patient's prescriptions exist on the      [employer] 4 dollar list.  She states she is able to afford these      medications until insurance is reinstated on August 25, 2008.   MEDICATIONS AT THE TIME OF DISCHARGE:  1. Advair 500/50 one inhalation twice daily.  2. Lasix 20 mg p.o. daily.  3. Verapamil 80 mg p.o. t.i.d.  4. Clonidine 0.2 mg p.o. b.i.d.  5. Prednisone dose pack 48 tablets, take as directed.  6. The patient is instructed to discontinue lisinopril.   PERTINENT LABORATORIES AT THE TIME OF DISCHARGE:  White cell count of  11.6, platelet count 183, hemoglobin 13.0, and hematocrit 41.3.  Sodium  137, potassium 4.1, BUN 26, and creatinine 0.91.  Hemoglobin A1c is  6.5%.  Total cholesterol 226, triglycerides 30, HDL 52, and LDL 168.   DISPOSITION:  Again, the patient felt medically stable for discharge  home at this time.  Importance of med compliance reiterated to the  patient who verified understanding.  Again, the patient will be able to  afford medications on [employer] 4 dollar list in addition to Pharmacy's  help while inpatient.  The patient is instructed to follow up with her  primary care physician Dr. Eleonore Chiquito on August 29, 2008, at 1  p.m.      Cordelia Pen, NP      Raenette Rover. Felicity Coyer, MD  Electronically Signed    LE/MEDQ  D:  08/15/2008  T:  08/15/2008  Job:  [id number]   cc:   Gordy Savers, MD

## 2011-01-07 NOTE — H&P (Signed)
NAMEJANISA, [name] NO.:  [id number]   MEDICAL RECORD NO.:  [id number]          PATIENT TYPE:  EMS   LOCATION:  MAJO                         FACILITY:  [facility]   PHYSICIAN:  [name], MDDATE OF BIRTH:  16-Sep-1945   DATE OF ADMISSION:  08/09/2008  DATE OF DISCHARGE:                              HISTORY & PHYSICAL   PRIMARY CARE:  Dr. Amador [name].   CHIEF COMPLAINT:  Shortness of breath.   Patient is a 65 year old female with history of asthma, severe  hypertension, and CVA in the past who presented with severe shortness of  breath.  She has not been taking any of her medications for the past  week because she ran out and could not refill them.  In the past, she  had episodes of severe asthma, supposedly seen by pulmonology although  there are no notes in the chart.  She said that she could not take all  the medicines that they wrote for her because they were making her  sleepy.  She has been having some cough for the past few days.  No  fevers.  No chills.  No nausea.  No vomiting.  No abdominal pain.  No  chest pain.  Otherwise, review of systems unremarkable.   PAST MEDICAL HISTORY:  Significant for:  1. Asthma.  No known history of COPD.  2. Hypertension.  3. Severe diabetes, possibly diet-controlled.  4. History of CVA.  5. Hypercholesteremia.  6. History of uterine bleeding in the past.  7. Patient has history of albinism.   SOCIAL HISTORY:  Patient smokes, occasional drinks alcohol, but  otherwise no drug abuse.   FAMILY HISTORY:  Noncontributory.   ALLERGIES:  PATIENT DOES NOT REMEMBER THE NAME OF MEDICINES TO WHICH SHE  IS ALLERGIC.   MEDICATIONS:  Patient does not quite remember the name of her medicines  but per review of her records, as well as per ED notes she is supposed  to be on:  1. Advair 400/50 mg daily.  2. Clonidine dose unknown.  3. Hyzaar dose unknown.  4. Hydroxyzine as needed.  5. Lasix 20 once a day.  6. She at  some point was also on lisinopril as well.   VITAL SIGNS:  Temperature 98.4.  Blood pressure 207/104.  Pulse 126, now  down to 70s.  Respirations initially 32 now down to 24.  Patient feeling  better.  O2 sat 86% room air, now 96% on 3 L.  Patient appears to be in  no acute distress.  HEAD:  Nontraumatic.  Moist mucous membranes.  LUNGS:  Severe wheezes bilaterally.  No crackles noted.  HEART:  Regular rate and rhythm.  No murmurs appreciated.  ABDOMEN:  Obese but nontender, nondistended.  LOWER EXTREMITIES:  Without clubbing, cyanosis, or edema.  Strength 5/5  in all 4 extremities otherwise.  NEUROLOGICAL:  Intact.   LABS:  White blood cell count 8.4, hemoglobin 13.3, sodium 143,  potassium 4.0, creatinine 0.7.  VBG 7.376, pCO2 of 54, pO2 of 37.  CK  756, MB 6.8, troponin 0.04.  Chest x-ray showing cardiomegaly and  pulmonary congestion, otherwise  unremarkable.   ASSESSMENT AND PLAN:  This is a 65 year old female patient of Dr.  Amador [name] with a history of asthma presents with severe wheezing and  slightly wet chest x-ray.  Has not been compliant of any of her  medicines and also severe hypertensive.  1. Asthma exacerbation.  We will continue Solu-Medrol, she already was      given.  We will continue Advair, albuterol nebs.  Would recommend      pulmonary consult.  Her VBG is showing slightly elevated pCO2 which      is somewhat worrisome.  Would need to obtain a real ABG to try to      determine if there is any progression.  She is clinically doing      somewhat better though we will continue with nebulizer treatment      and if there is any worsening or increasing respiratory or      increasing work of breathing we will obtain ABG.  Currently, she is      seems to be fairly comfortable, able to speak in full sentences.  2. Hypertension, severe, partly because patient stopped all her      medicines.  We will restart clonidine and lisinopril and continue      to cycle cardiac  enzymes.  Give IV Lasix, especially since she has      mild congestion on her chest x-ray.  Order 2D echo in a.m. to      determine if there is any diastolic dysfunction or hypertrophy      secondary to longstanding hypertension given that she has      cardiomegaly on her chest x-ray.  3. Prophylaxis, Protonix and Lovenox.  4. Mild diabetes.  We will obtain hemoglobin A1c and do sliding scale.  5. History of hyperlipidemia.  Check fasting lipid panel.  Check TSH.      We will watch in the step down for now.      Anita Cowboy, MD  Electronically Signed     AVD/MEDQ  D:  08/09/2008  T:  08/10/2008  Job:  [id number]   cc:   Anita Savers, MD

## 2011-01-07 NOTE — Assessment & Plan Note (Signed)
[facility]                                 ON-CALL NOTE   [name], [name]                  MRN:          [id number]  DATE:05/09/2007                            DOB:          05/26/1946    PHONE NUMBER:  [phone]   She states that she has a problem with swelling and pain in her arm that  started off as a quarter size yesterday.  She has no associated trauma  or bite and now it has spread to swelling her whole upper arm with  redness and tenderness.  She says it itches a little bit.  Cannot  describe a boil or an abscess, but unsure of this.  She has no fever.  I  have recommended that she be evaluated at Urgent Care or the emergency  room for potential diagnosis of rapidly progressing cellulitis.  The  patient understands this.  Otherwise follow up with Dr. Amador Cunas when  the office is open.     Neta Mends. Panosh, MD  Electronically Signed    WKP/MedQ  DD: 05/09/2007  DT: 05/09/2007  Job #: [id number]

## 2011-01-07 NOTE — Cardiovascular Report (Signed)
NAMECAMALA, TALWAR NO.:  [id number]   MEDICAL RECORD NO.:  [id number]          PATIENT TYPE:  INP   LOCATION:  2917                         FACILITY:  [facility]   PHYSICIAN:  Bevelyn Buckles. Bensimhon, MDDATE OF BIRTH:  [birth year]   DATE OF PROCEDURE:  08/11/2008  DATE OF DISCHARGE:                            CARDIAC CATHETERIZATION   IDENTIFICATION:  [name] is a 65 year old woman with COPD,  hypertension, and diabetes.  She was admitted with a COPD flare and  subsequently socially developed positive enzymes with a troponin of 4.7.  She was evaluated and referred for diagnostic catheterization.   PROCEDURES PERFORMED:  1. Right heart catheterization.  2. Left heart catheterization.  3. Left ventriculogram.  4. Selective coronary angiography.  5. StarClose common femoral artery closure.   DESCRIPTION OF PROCEDURE:  The risk and indication were explained,  consent was signed, and placed on the chart.  A 6-French arterial sheath  was placed in the right femoral artery using modified Seldinger  technique.  Standard catheters including JL4, JR4, and angled pigtail  used for procedure.  All catheter exchanges made over wire.  A 7-French  venous sheath was placed in the right femoral vein.  A standard right  heart cath was performed with a Swan-Ganz catheter.   HEMODYNAMICS:  Right atrial pressure mean of 10, RV pressure 56/2, an  EDP of 12.  PA pressure 52/14 with a mean of 34.  There was significant  respiratory variation.  Pulmonary capillary wedge pressure mean of 18.  Central aortic pressure 172/81 with a mean of 114.  LV pressure 198/14  with an EDP of 23.  Fick cardiac output 7.8 liters per minute.  Cardiac  index 3.7 liters per minute per meter squared.  There was no aortic  stenosis.  Pulmonary vascular resistance was 2.0 Woods units.   Left main was normal.   LAD was a long vessel wrapping the apex and feeding much of the PDA  territory.  It gave off a  moderate-sized diagonal in the midsection.  Throughout the proximal mid section, there was mild minor luminal  plaquing about 30%.  In very distal apical LAD, there was an 80% focal  stenosis in ostial portion of the small first septal.  There was a 70%  lesion in the ostium of its diagonal.  There appeared to be a 50-60%  lesion on some of the shots.   Left circumflex gave off a large branching ramus and a moderate-sized  OM1.  In initial shot, there were some streaming, which are created the  sense of some stenosis in ostial portion of the circumflex and ramus.  However, on followup shots, these appeared to be clear.  There was 30%  plaquing through the mid AV groove circumflex.   Right coronary was a moderate-sized codominant vessel gave off an RV  branch.  PDA and posterolateral were angiographically normal.   Left ventriculogram done in the RAO position showed an EF of 65-70%.   ASSESSMENT:  1. Mild coronary artery disease with high-grade lesion in the distal      left anterior descending.  2. Mild  pulmonary arterial hypertension with normal pulmonary vascular      resistance.  3. Normal left ventricular function.   PLAN/DISCUSSION:  I suspect her enzymes were due to demand ischemia  potentially from the distal LAD lesion.  This is not a good target for  intervention.  We will plan with medical therapy and aggressive risk  factor modification.      Bevelyn Buckles. Bensimhon, MD  Electronically Signed     DRB/MEDQ  D:  08/11/2008  T:  08/11/2008  Job:  [id number]

## 2011-01-10 NOTE — Consult Note (Signed)
NAME:  [name], [name]                     ACCOUNT NO.:  [id number]   MEDICAL RECORD NO.:  [id number]                   PATIENT TYPE:  INP   LOCATION:  9372                                 FACILITY:  [facility]   PHYSICIAN:  Jonelle Sidle, M.D. [facility]        DATE OF BIRTH:  1946/03/26   DATE OF CONSULTATION:  12/17/2003  DATE OF DISCHARGE:                                   CONSULTATION   REQUESTING PHYSICIAN:  Charles A. Clearance Coots, M.D.   PRIMARY CARE PHYSICIAN:  Robyn N. Allyne Gee, M.D.   REASON FOR CONSULTATION:  Electrocardiographic changes perioperatively.   HISTORY OF PRESENT ILLNESS:  Ms. 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  She has had difficulty with dysfunctional uterine bleeding secondary  to fibroids, and underwent a hysterectomy under spinal anesthesia this past  Friday.  It was noted that the patient's systolic blood pressure dropped  from the 190 range down to the 110 range during surgery, and apparently at  that time the patient was noted to have some ST segment changes in lead 2 on  telemetry which reportedly improved as systolic blood pressure increased  into the 130 to 160 range.  The patient apparently had a decrease in mental  status at that time as well.  Since then, her blood pressures have been  largely increased with systolic's ranging in the 160 to 180 range.  Her  electrocardiogram from December 12, 2003, showed sinus rhythm with voltage  criteria for left ventricular hypertrophy.  Repeat tracing on December 15, 2003, showed some mild ST segment depression in leads 2, 3, and AVF, with T-  wave flattening and lateral T-wave flattening with subsequent tracing on  December 16, 2003, showing improvement in these changes.  [name] has had  no chest discomfort, shortness of breath, or palpitations during her  hospital stay.  In speaking with her further, she has no typical exertional  symptoms, has had no prior risk stratifications for coronary artery disease.  She did undergo an echocardiogram during her evaluation for stroke back in  February, which though being technically difficult showed moderate to severe  left ventricular hypertrophy with an ejection fraction of 65 to 75% range.  She is currently being managed with Lisinopril, hydrochlorothiazide, and  p.r.n. clonidine, but has not yet had optimal blood pressure control.  Recent blood work also shows a LDL cholesterol of 108.  She is currently now  on statin therapy, and I am not certain as to whether her borderline  diabetes has been treated in the past.  We have been asked to evaluate the  patient further in terms of additional risk stratification and  recommendations for long-term followup.   ALLERGIES:  No known drug allergies.   CURRENT MEDICATIONS:  1. Hydrochlorothiazide 25 mg p.o. daily.  2. Aspirin 325 mg p.o.  daily.  3. Lisinopril 10 mg p.o. daily.  4. Protonix.  5. Advair.  6. Dilaudid.  7. Cefoxitin.   PAST MEDICAL HISTORY:  1. Long-standing hypertension.  There apparently have been some issues with     compliance long-term, and it sounds as if this has not been optimally     controlled.  2. Legal blindness.  3. Albinism.  4. History of asthma.  The patient wheezes occasionally, and has to use a     rescue meter dosed inhaler.  She is also on Advair.  It does not sound as     if she has had any hospitalizations for asthma exacerbations.  5. Dyslipidemia with LDL cholesterol of 108.  Triglycerides were within     normal range.  6. Apparent iron-deficiency anemia, presumably due to dysfunctional uterine     bleeding.  7. No known history of coronary artery disease or myocardial infarction.  8. Recently diagnosed left thalamic stroke in February 2005.  The patient     apparently had some type of event last week, and  had a repeat CT scan     of the head performed on December 12, 2003, showing no acute events.  I am     not certain whether she was seen by neurology at that time, or what her     long-term plan was.  She reports having some residual bilateral upper     extremity weakness and some tingling in her hands.   SOCIAL HISTORY:  There is no tobacco use history.  She drinks alcohol  occasionally.   FAMILY HISTORY:  Apparently noncontributory for premature cardiovascular  disease.  The patient is not entirely certain about all of her family's  medical past, but cannot recall anyone dying with a myocardial infarction at  an early age.   REVIEW OF SYSTEMS:  As per history of present illness.  She has had no  problems with orthopnea or PND.  She has been fatigued since her stroke, but  does ambulate without any sort of assistance.  She reports a long-standing  history of intermittent palpitations, but no presyncope or syncope.  Otherwise, systems are negative.   PHYSICAL EXAMINATION:  VITAL SIGNS:  Blood pressure systolic ranging from  125 to 086 over a diastolic of 50 to 80.  Temperature 99.7 degrees, heart  rate 50 to 80 in sinus rhythm, oxygen saturation is 98% on room air.  GENERAL:  This is a morbidly obese woman with albinism, laying nearly supine  in no acute respiratory distress.  HEENT:  Conjunctivae and lids normal.  Oropharynx is clear.  NECK:  Supple without elevated jugular venous pressure or carotid bruits, no  thyromegaly is noted.  LUNGS:  Exhibit faint end expiratory wheezes, particularly on the right at  the base.  There is no rhonchi.  Breath sounds are somewhat diminished  throughout.  Respirations are non labored at rest.  CARDIOVASCULAR:  Regular rate and rhythm without an S3 gallop.  There is a  soft systolic murmur heard throughout the precordium.  There is no  pericardial rub.  ABDOMEN:  Exhibits normoactive bowel sounds and is mildly tender postoperatively.   EXTREMITIES:  No gross pitting edema.  There are sequential compression  devices in place on the lower extremities.  NEUROLOGIC:  The patient reports bilateral upper extremity weakness and  perhaps has decreased 4/5 strength.   LABORATORY DATA:  WBC's 5.2, hemoglobin 8.5, hematocrit 27.5, platelets 268.  Sodium 138, potassium 3.7, glucose 102,  BUN 13, creatinine 0.8.  INR 1.  No  cardiac markers obtained.   IMPRESSION:  1. Electrocardiographic changes perioperatively as described above.     Technically, the patient did not become hypotensive during surgery,     although typically has a fairly high blood pressure that has not been     optimally controlled and did receive spinal anesthesia with a fairly     rapid drop in her blood pressure.  This may have been the precipitant,     but certainly underlying ischemic heart disease would be a concern with     her risk factors.  Ms. Coil has had no anginal complaints, and has a     normal ejection fraction by recent echocardiogram.  2. Uncontrolled hypertension.  3. Reported borderline diabetes.  4. Mild dyslipidemia with a LDL cholesterol of 108.  5. Legal blindness.  6. Albinism.  7. History of asthma.  There is some mild end expiratory wheezing on     examination now, but no labored breathing.  8. Recent left thalamic stroke as described.  9. Status post total abdominal hysterectomy with bilateral salpingo-     oophorectomy under spinal anesthesia, postoperative day #2.   RECOMMENDATIONS:  1. At this point, I agree with aspirin, Lisinopril, and hydrochlorothiazide.     It might be best to avoid using clonidine p.r.n. and actually titrate the     other standing medications to attain better blood pressure control     chronically.  Actually, long-term clonidine would not be a good choice in     this woman if there are compliance issues, given concerns about rebound     hypertension.  2. In terms of additional risk stratification,  would suggest either an     Adenosine or Dobutamine Cardiolite depending on the patient's respiratory     status.  If this were a low risk study, would recommend risk factor     modification.  If the study were a high risk abnormal, we would need to     consider coronary angiography, although ideally I would like to avoid     this at this time given the patient's recent stroke and abdominal     surgery.  3. In terms of additional followup, the patient clearly needs long-term     management of her blood pressure, and additional assessment with a     hemoglobin A1C to evaluate her borderline diabetes.  Ultimately, statin     therapy would probably be indicated as well.  This can be arranged     through the patient's primary care physician.  4. In terms of additional cardiac followup, we can make further     recommendations based on Cardiolite results.  It might at some point be    worth considering an outpatient event recorder if the patient's history     of palpitations becomes more problematic.  She has had no specific     arrhythmias on telemetry during hospitalization.                                               Jonelle Sidle, M.D. [facility]    SGM/MEDQ  D:  12/17/2003  T:  12/17/2003  Job:  [id number]

## 2011-01-10 NOTE — Discharge Summary (Signed)
NAMEANNETT, BOXWELL                     ACCOUNT NO.:  [id number]   MEDICAL RECORD NO.:  [id number]                   PATIENT TYPE:  INP   LOCATION:  3019                                 FACILITY:  [facility]   PHYSICIAN:  Elliot Cousin, M.D.                 DATE OF BIRTH:  1946/01/28   DATE OF ADMISSION:  10/22/2003  DATE OF DISCHARGE:  10/25/2003                                 DISCHARGE SUMMARY   DISCHARGE DIAGNOSES:  1. Acute left thalamus stroke with right-sided facial numbness and resolved     right leg weakness.  2. Hypertension.  3. Dysfunctional uterine bleeding secondary to fibroids, status post recent     dilatation and curettage.  4. Microcytic iron deficiency anemia.  5. Mild dyslipidemia.  6. Asthma.  7. Albinism.  8. Legal blindness.   DISCHARGE MEDICATIONS:  1. Hydrochlorothiazide 25 mg, 1/2 tablet daily.  2. Lisinopril 5 mg daily.  3. Ferrous sulfate (or previously described iron supplement) 1 tablet 3     times daily.  4. Multivitamin with iron once daily.  5. Advair inhaler 1 inhalation b.i.d.  6. Albuterol MDI 2 puffs as needed.  7. Hold aspirin for now, follow up with surgeon.   DISCHARGE DISPOSITION:  Ms. Tolleson was discharged to home in improved and  stable condition on October 25, 2003.  She has a hospital follow-up appointment  with her new physician, Dr. Dorothyann Peng, on November 07, 2003, at 10:30 a.m.   CONSULTATIONS:  Michael L. Thad Ranger, M.D.   PROCEDURES PERFORMED:  1. CT scan of the head without contrast on October 22, 2003.  The results     revealed no evidence of intracranial hemorrhage or hydrocephalus.  2. MRI of the brain with and without contrast on October 23, 2003.  The     results were significant for a small acute infarct in the left thalamus.     MRA of the brain was negative.  3. Bilateral carotid Doppler study on October 23, 2003.  No ICA stenosis     bilaterally.  Vertebral artery flow antegrade.  4. A 2-D echocardiogram on  October 23, 2003.  The results revealed a     technically difficult study.  Overall left ventricular systolic function     was vigorous, ejection fraction estimated at 65% to 75%.  No left     ventricular regional wall motion abnormalities.  Left ventricular wall     thickness was moderately to markedly increased.   HISTORY OF PRESENT ILLNESS:  The patient is a 65 year old African-American  woman with a past medical history significant for hypertension, albinism,  legal blindness, and asthma, who presented to the emergency department on  the afternoon of October 22, 2003, with a 24-hour history of right leg  weakness, right facial numbness, and right leg numbness.  The patient  reported that the paresthesias were mild approximately 24 hours prior to  hospitalization.  However, the  symptoms progressed to the point that she  felt as if she was dragging her right leg.  The patient had not seen a  primary care physician in over 1 year.  She has been noncompliant with her  antihypertensive medications because of the cost.  The patient does have  medical insurance; however, it pays very little for medications.  The  patient had also been taking sudoephedrine decongestant over the past 1-2  days.  She has not taken any of her antihypertensive medications in over 1  year.   HOSPITAL COURSE:  #1 - ACUTE LEFT THALAMUS STROKE WITH RESIDUAL RIGHT FACIAL  NUMBNESS AND RESOLVED RIGHT LEG PARESIS:  When the patient was evaluated in  the emergency department her initial systolic blood pressure was well over  200.  At the time of the initial evaluation by Dr. Lanier Ensign, her  blood pressure was 181/73.  Per exam the patient's right lower extremity was  asymmetrically weaker than her left lower extremity.  With regards to the  sensory exam, the patient did have less sensation on the right side of her  face compared with the left.  Finger-to-nose and heel-to-shin, cerebellar  exams were within  normal limits.  The patient's visual fields were full.  The patient had no facial or tongue asymmetry.  The initial evaluation  started in the emergency department when the patient was assessed with a CT  scan of the head.  The CT scan was essentially negative.  For follow-up  evaluation, an MRI of the brain, MRA of the brain, carotid Dopplers, and a 2-  D echocardiogram were ordered.  The MRI of the brain revealed a small acute  infarct in the left thalamus.  The MRA was essentially negative.  The  carotid Doppler study revealed no ICA stenosis.  The 2-D echocardiogram  revealed LVH with a vigorous ejection fraction at 65-75%.  There were no  significant valvular abnormalities.  Following the results, the patient was  started on an aspirin at 325 mg daily.  Neurologist, Dr. Kelli Hope,  was consulted.  Dr. Thad Ranger agreed with the assessment and the current  medical management.  However, he suggested discontinuing the aspirin given  that the patient was having quite a bit of dysfunctional uterine bleeding.  She is recently status post D&C and is planning to undergo a hysterectomy  soon.  The aspirin was reduced to 81 mg daily and then eventually  discontinued.  She will probably need to have an antiplatelet medication  following the hysterectomy.  The patient received physical therapy  evaluation during the hospital course.  Per the physical therapist at the  time of her evaluation the patient's right-sided weakness had resolved.  She  had resolution of the right leg weakness prior to hospital discharge.   #2 - HYPERTENSION:  The patient has chronic hypertension, but she has not  had treatment in over 1 year.  She did not recall the name of the previous  antihypertensive medication she was on.  Her blood pressures on admission  ranged between 180 and 210 systolically.  The patient was initially treated  with hydrochlorothiazide 25 mg daily and clonidine 0.1 mg every 2 hours  as needed for a systolic blood pressure greater than 180-200 per Dr. Lanier Ensign.  The regimen was eventually changed to clonidine 0.1 mg q.4h. for  systolic blood pressure greater than 200.  The hydrochlorothiazide was  decreased to 12.5 mg daily, and the patient was placed  on lisinopril 10 mg  daily.  Over the 3-day hospital course the patient's systolic blood  pressures fell into the 140s.  The hydrochlorothiazide was maintained at  12.5 mg daily, and the lisinopril was reduced to 5 mg daily.  It was felt  that the systolic blood pressure drop was too rapid.  The patient will need  to have follow-up with her new physician, Dr. Dorothyann Peng, for further  adjustments in her antihypertensive medications.  The patient was advised to  follow a low-salt diet and to be more compliant with taking her medications.  The hydrochlorothiazide and the lisinopril are relatively inexpensive  compared with the other antihypertensive medications.   #3 - MILD DYSLIPIDEMIA:  In the evaluation of the patient's stroke, a  fasting lipid profile was ordered.  The results revealed a total cholesterol  of 162, triglycerides of 73, HDL cholesterol mildly low at 39, and an LDL  cholesterol mildly elevated at 108.  The patient was advised to follow a low-  fat diet.  She probably needs a statin medication; however, because of the  cost of this medication it was not prescribed.  Again, the patient was  advised to follow a low-fat diet.  Further management per her primary care  physician.   #4 - MICROCYTIC IRON DEFICIENCY ANEMIA:  The patient's hemoglobin during the  hospital course ranged between 8.9 and 10.3.  She recently underwent a D&C  and was actively having uterine bleeding during the hospital course.  The  patient has fibroids and is planning to have a hysterectomy soon.  Her  stools were not checked for occult blood because of the possible  contamination from the uterine bleeding.  Her MCV ranged  between 72 and 73.  Her hematocrit ranged between 28 and 32.5.  She was previously treated with  iron supplementation by her gynecologist.  She was maintained on iron  supplementation during the hospital course.  Her PT was 12.7, INR 0.9, and  PTT 24.  Iron studies revealed a ferritin of 11, vitamin B12 of 464, and a  folate of 19.9.  The patient was advised to maintain supplemental iron 3  times daily.  She was also advised to take a multivitamin with iron daily.  She was strongly advised to follow up with her gynecologist following  hospital discharge secondary to the ongoing dysfunctional uterine bleeding.                                                Elliot Cousin, M.D.    DF/MEDQ  D:  10/27/2003  T:  10/28/2003  Job:  [id number]   cc:   Candyce Churn. Allyne Gee, M.D.  [address]  [address]  [place]  [place] [address]  Fax: [phone]

## 2011-01-10 NOTE — Consult Note (Signed)
NAME:  [name], [name]                     ACCOUNT NO.:  [id number]   MEDICAL RECORD NO.:  [id number]                   PATIENT TYPE:  OUT   LOCATION:  [facility]                                  FACILITY:  [facility]   PHYSICIAN:  Marlan Palau, M.D.               DATE OF BIRTH:  05/10/1946   DATE OF CONSULTATION:  12/18/2003  DATE OF DISCHARGE:                                   CONSULTATION   CONSULTING PHYSICIAN:  Marlan Palau, M.D.   REFERRING PHYSICIAN:  Jonelle Sidle, M.D. 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  The patient has a history  of hypertension and has a history of medical non-compliance.  The patient  was recently seen by Dr. Roselee Nova from our group and underwent a stroke  workup in February of 2005 that included an MRI, MRI angiogram and a 2D  echocardiogram.  The patient was found to have a small vessel ischemic event  involving the left thalamus basal ganglia area.  She had trouble with  uterine bleeding and was not adequately treated at that point.  The patient,  however, at this time has been placed on aspirin.  The patient was felt to  have small vessel ischemic changes secondary to her hypertension.  The  patient comes in at this point and had a hysterectomy on December 15, 2003.  Six days prior to this consultation, the patient began noting some trouble  with right facial weakness and some slurred speech.  This seemed to improve,  but may have worsened again the day after surgery.  The patient feels weak  all over.  No focal weakness is noted.  The patient claims to have some  tingling sensations of both hands.  The patient denies any changes in  vision, but is legally blind.  The patient has been ambulatory during this  admission. Neurology is asked to see this patient for further  evaluation. CT  scan of the head done on April 19 showed no acute change.   PAST MEDICAL HISTORY:  1. Left thalamic infarct in February of 2005.  2. Hypertension.  3. Iron deficiency anemia.  4. Uterine bleeding with fibroids.  5. Hysterectomy in the past six days.  6. History of albinism.  7. Asthma.  8. Obesity.  9. Legal blindness.  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  11.      Questionable hyperlipidemia.   ALLERGIES:  The patient has no known allergies.   HABITS:  She does not smoke.  She drinks alcohol on occasion.   CURRENT MEDICATIONS:  1. Aspirin 325 mg a day.  2. Hydrochlorothiazide 25 mg a day.  3. Advair one puff b.i.d.  4. Lisinopril 10 mg b.i.d.  5. Protonix  40 mg daily.  6. Simethicone a.c. and h.s.  7. Albuterol inhaler every four hours as needed.  8. Catapres 0.1 mg q.6h p.r.n.  9. Benadryl p.r.n.  10.      Reglan p.r.n.  11.      Tylox for pain.  12.      Ambien for sleep.   SOCIAL HISTORY:  The patient is widowed and lives alone in the [place]  area.  She has three children who are alive and well.   FAMILY HISTORY:  Mother is alive and has heart disease, hypertension and  diabetes.  Father died with an MI and drowned.  The patient has two brothers  and five sisters.  Brothers and sisters have hypertension.  There is no  diabetes in brothers or sisters.  One sister has cancer of the breast.  Mother also has cancer of the breast.   REVIEW OF SYMPTOMS:  GENERAL:  Notable for no fevers or chills. HEENT:  The  patient denies headaches.  CARDIOPULMONARY:  She has asthma and shortness of  breath with this.  She denies chest pain.  GI AND GU:  She denies abdominal  pain.  She has urgency of the bowels and the bladder.  NEUROLOGIC:  She  feels weak all over.  She has tingling in both hands.  No visual field  changes or headache.   PHYSICAL EXAMINATION:  VITAL SIGNS:  Blood pressure is 157/64, heart rate  73, temperature 99.9.  GENERAL:  This patient is a  moderately obese black female who is alert and  cooperative at the time of the examination.  HEENT:  The head is atraumatic.  Eyes:  Pupils equal, round and reactive to  light.  Disks are flat bilaterally.  The neck is supple.  No carotid bruits  are noted.  RESPIRATORY:  Clear.  CARDIOVASCULAR:  Regular rate and rhythm with no obvious murmurs or rubs  noted.  EXTREMITIES:  Trace edema of the ankles bilaterally.  NEUROLOGIC EXAM:  Cranial nerves are as above.  Facial symmetry is not  present, but there is a depression of the right nasal labial fold.  The  patient has good sensation of face to pinprick and soft pressure  bilaterally.  She has a congenital nystagmus and diverging gaze with right  medial rectus weakness.  The patient has normal speech and no aphagia.  Motor testing reveals 5/5 strength in all fours with good symmetric motor  tone.  Sensory testing is intact to pinprick, soft pressure and vibratory  sensation throughout.  The patient has fair finger/nose/finger and toe to  finger bilaterally.  Drift is not present.  Deep tendon reflexes remain  symmetric and normal.  Toes are neutral bilaterally.  Again, pinprick, soft  pressure sensation and vibratory sensation on the arms and legs are normal.  The patient was not ambulated.   LABORATORY DATA:  Sodium 139, potassium 4.0, chloride 106, C02 27, glucose  113, BUN 4, creatinine 0.8.  Total bilirubin 0.4, alkaline phosphatase 64,  SGOT 16, SGPT 12, total protein 6.4, albumin 3.2, calcium 9.2.  White count  5.7, hemoglobin 8.6, hematocrit 27.9, MCV 71.7, platelets 283,000.   IMPRESSION:  1. History of cerebrovascular disease with recent left thalamic and basal     ganglia infarct.  2. Hypertension.  3. Obesity.   This patient claims to have had some worsening weakness of the right face.  Certainly, some right facial droop is present but this was noted on previous evaluation in February  2005.  The patient has had a prior  stroke workup.  The patient is on aspirin therapy for now. We will proceed with further  workup to determine whether the patient has extended this stroke slightly,  but I am not sure this would change any of the medical therapy at this  point.  The patient really has no other new focal findings.   PLAN:  1. Continue aspirin therapy.  2. MRI scan of the brain whenever staples are removed from the surgical     site.  3. We will follow the patient's clinical course while in house.                                               Marlan Palau, M.D.    CKW/MEDQ  D:  12/18/2003  T:  12/18/2003  Job:  [id number]   cc:   Jonelle Sidle, M.D. [facility]   Charles A. Clearance Coots, M.D.

## 2011-01-10 NOTE — Op Note (Signed)
NAME:  [name], [name]                     ACCOUNT NO.:  [id number]   MEDICAL RECORD NO.:  [id number]                   PATIENT TYPE:  AMB   LOCATION:  [facility]C                                  FACILITY:  [facility]   PHYSICIAN:  Charles A. Clearance Coots, M.D.             DATE OF BIRTH:  06/28/1946   DATE OF PROCEDURE:  10/13/2003  DATE OF DISCHARGE:                                 OPERATIVE REPORT   PREOPERATIVE DIAGNOSES:  1. Menorrhagia.  2. Uterine fibroids.   POSTOPERATIVE DIAGNOSIS:  1. Menorrhagia.  2. Uterine fibroids.   PROCEDURE:  Hysteroscopy and dilation and curettage.   SURGEON:  Charles A. Clearance Coots, M.D.   ANESTHESIA:  General.   ESTIMATED BLOOD LOSS:  100 mL.   COMPLICATIONS:  None.   SPECIMENS:  Endometrial curettings.   FINDINGS:  Large submucous fibroid anteriorly and multiple submucous polyps  posteriorly along with large posterior submucous fibroid.   OPERATION:  The patient was brought to the operating room and after  satisfactory general endotracheal anesthesia the vagina was prepped and  draped in the usual sterile fashion.  The urinary bladder was emptied of  approximately 100 mL of clear urine.  Bimanual examination revealed the  uterus to be midposition.  A sterile speculum was inserted in the vaginal  vault and the cervix was isolated.  The anterior lip of the cervix was  grasped with a single-tooth tenaculum.  A paracervical block of 1% Xylocaine  was injected into each lateral fornix, approximately 8 mL in each lateral  fornix.  The uterus was then sounded to 9 cm.  The hysteroscope was easily  inserted through the cervical os into the uterine cavity.  Hysteroscopic  survey revealed multiple submucous uterine fibroids and endometrial polyps.  The hysteroscope was removed and the endometrial cavity was curetted with a  small sharp curette and the specimens were submitted to pathology for  evaluation.  There was no active bleeding at the conclusion of the  procedure.  The patient tolerated the procedure well, was transported to the  recovery room in satisfactory condition.                                               Charles A. Clearance Coots, M.D.   CAH/MEDQ  D:  10/13/2003  T:  10/13/2003  Job:  [id number]

## 2011-01-10 NOTE — H&P (Signed)
NAME:  Anita, [name]                     ACCOUNT NO.:  [id number]   MEDICAL RECORD NO.:  [id number]                   PATIENT TYPE:  INP   LOCATION:  3019                                 FACILITY:  [facility]   PHYSICIAN:  Lanier Ensign, M.D.            DATE OF BIRTH:  [birth year]   DATE OF ADMISSION:  10/22/2003  DATE OF DISCHARGE:                                HISTORY & PHYSICAL   This patient is admitted to the service of [facility].   PRIMARY CARE PHYSICIAN:  Unassigned.  Was previously seen by Dr. Valentina Lucks of  [name], has not seen this doctor in 1.5 years.   REFERRING PHYSICIAN:  Emergency room of [facility], Dr. Read Drivers.   CHIEF COMPLAINT:  Right leg weakness and right-sided facial and right leg  paresthesias.   HISTORY OF PRESENT ILLNESS:  The patient is a 65 year old African-American  female with a history of hypertension, albinism, legal blindness, asthma,  and noncompliance who presents to the emergency room in the afternoon with  greater than 24 hours of symptoms as per above beginning with paresthesias  and then progressing to right leg weakness such that she felt she had to  drag her right leg.  The patient has not been seen by a primary care  physician in over one year.  She has been noncompliant with antihypertensive  medications and has taken no medications for hypertension in this time.  In  addition the patient has been taking pseudoephedrine decongestant tablets  the last 1 to 2 days.   PAST MEDICAL HISTORY:  1. Asthma.  2. Hypertension.  3. Albinism.  4. Legal blindness.  5. Noncompliance.   PAST SURGICAL HISTORY:  Recent D&C on October 13, 2003 for fibroids in  which majority of fibroids were removed.  There are further plans to do a  hysterectomy.  Also history of one cesarean section.   ALLERGIES:  No known drug allergies.   MEDICATIONS:  She has been taking Bronkaid over-the-counter medication and  Allergy Sinus with pseudoephedrine 60 mg daily, and Ibuprofen.   SOCIAL HISTORY:  No tobacco.  Occasional alcohol.  No drug use.   FAMILY HISTORY:  Significant for mother with hypertension, diabetes, history  of transient ischemic attacks.  A history of TIAs in a brother, history of  TIAs in a sister (all may be related to hypertension).  Breast cancer in one  sister, one niece and two aunts.   REVIEW OF SYSTEMS:  CONSTITUTIONAL:  The patient also had an episode of  transient dizziness at the beginning of symptoms which the patient states  does not feel like presyncope and is not vertigo; this has resolved.  PULMONARY:  The patient denies chest pain, no shortness of breath except at  night when she does not take her Bronkaid, she develops wheezing at night  with some mild shortness of breath.  HEENT:  The patient has had recent  frontal and sinus headaches the past 1 to 2 days without nasal congestion.  Positive for rhinitis.  The patient denies ear ache, no blurred vision.  GU:  Following her surgery the patient has been having a moderate amount of  vaginal bleeding requiring 3 to 4 maxi-pads during the day hours, this is  decreasing since the surgery.  NEUROLOGIC:  While in the emergency room  according to the patient, the right leg weakness resolved but she continues  to have numbness on the right side of her mouth and paresthesias along the  right leg.   PHYSICAL EXAMINATION:  VITAL SIGNS:  Pulse is 63, blood pressure 181/73,  respirations 23, saturation 100% on room air.  GENERAL:  The patient is an obese, African-American female with albinism, in  no acute distress.  HEENT:  Normocephalic, atraumatic.  Gaze is disconjugate with mild vertical  and horizontal nystagmus.  Funduscopic exam is very difficult after multiple  attempts.  Sclerae white, conjunctivae clear.  Tympanic membranes not  visualized secondary to cerumen impaction bilaterally.  Oropharynx clear  without exudate,  mucous membranes moist.  NECK:  Supple, no thyromegaly, no lymphadenopathy, no bruits.  HEART:  Regular rate and rhythm without murmurs, rubs, or gallops.  LUNGS:  Good respiratory effort, scattered end-expiratory wheezing with  mildly prolonged end-expiratory phase.  No dullness.  No egophony.  ABDOMEN:  Obese, positive bowel sounds, soft, nontender, no  hepatosplenomegaly.  LOWER EXTREMITIES:  Trace edema, no cyanosis, no clubbing.  NEUROLOGIC EXAM:  The tongue is midline.  Facial symmetry.  Cranial nerves  II-XII grossly intact except for number II.  Deep tendon reflexes 2+ upper  and lower extremities.  At the time of my exam strength is symmetric, 5 out  of 5 upper and lower extremities.  No myoclonus.  Negative Babinski. No  pronator drift although with ambulation the patient is found to have quite  decreased coordination with picking up and ambulating with the right leg.   LABORATORY DATA:  Creatinine 1.0, potassium 3.3.  Hemoglobin 10.5,  hematocrit 31.  A CT scan showed no acute bleeding.   ASSESSMENT AND PLAN:  1. Cerebrovascular accident symptoms lasting greater than 24 hours, now     showing improvement in majority of symptoms.  The patient was given     aspirin prior to arrival in the emergency room.  Plan to monitor over     night on telemetry.  Control blood pressure.  In the morning obtain an     echocardiogram, Doppler ultrasound of carotids and MRI.   1. Hypertensive urgency versus emergency.  As patient's symptoms seem to be     resolving at this time and the patient likely has long-standing very high     blood pressure, will treat as urgency and decrease blood pressure slowly     within a reasonable systolic blood pressure range of 150 to 170 to     maximize cerebral blood flow.  The patient will need to be on a     medication she can afford, hydrochlorothiazide is likely a good option     and over night will use only small doses of p.o. Clonidine.  1. Asthma.   Mild symptoms currently.  The patient was unable to afford an     Advair inhaler.  Will place the patient on fluticasone and albuterol for     now.   1. Social.  Recommend a social work consult.   1. Neurology, for above was called regarding  management and plan. It was     decided that heparin would not be in the patient's best interest at this     time with greater than 24 hours and status post surgery, but should     tolerate aspirin.   1. Physical therapy/occupational therapy consult.                                                Lanier Ensign, M.D.    MHP/MEDQ  D:  10/23/2003  T:  10/23/2003  Job:  [id number]

## 2011-01-10 NOTE — Consult Note (Signed)
NAME:  [name], [name]                     ACCOUNT NO.:  [id number]   MEDICAL RECORD NO.:  [id number]                   PATIENT TYPE:  INP   LOCATION:  3019                                 FACILITY:  [facility]   PHYSICIAN:  Casimiro Needle L. Thad Ranger, M.D.           DATE OF BIRTH:  [birth year]   DATE OF CONSULTATION:  10/24/2003  DATE OF DISCHARGE:                                   CONSULTATION   REASON FOR ADMISSION:  Stroke.   HISTORY OF PRESENT ILLNESS:  This is the initial inpatient consultation  evaluation of this 65 year old woman  with a past medical history including  hypertension and medical noncompliance who had been off of her medications  for a little over a year. She presented to the emergency room on February 27  with a one to two day history of paresthesias on the right side and  subsequent right leg weakness interfering with her gait, the latter of which  had resolved by the time she came into the emergency room. She was admitted  for further workup. Her symptoms have improved while in the hospital and her  only complaint now is right facial numbness. She denies ever having anything  like this before and had no similar symptoms leading up to these sensations.  She denies any associated headache, chest pain, shortness of breath, or  palpitations. She did have a recent D&C and is reportedly scheduled for a  hysterectomy, but has had a lot of trouble with uterine bleeding and said  that this has actually been worsened since she was placed on aspirin in the  hospital.   PAST MEDICAL HISTORY:  1. Remarkable for hypertension for about 30 years.  2. Asthma.  3. Albinism.  4. Poor vision, the etiology of which I am not clear on.   FAMILY HISTORY/SOCIAL/REVIEW OF SYSTEMS:  As outlined in the admission  history and physical on October 22, 2003.   MEDICATIONS:  Prior to admission she was taking over-the-counter  medications, which included pseudoephedrine.   Here in the  hospital she is on:  1. Claritin.  2. Flovent.  3. Albuterol inhaler.  4. HCTZ 12.5 mg daily.  5. Baby aspirin.   PHYSICAL EXAMINATION:  VITAL SIGNS:  Afebrile. Blood pressure 209/114, pulse  58, respirations 18.  GENERAL:  This is an obese, but otherwise healthy appearing woman lying  supine in the hospital bed in no evident distress.  HEENT:  Cranium is normocephalic and atraumatic. Oropharynx is benign.  NECK:  Supple without carotid bruit.  HEART:  Irregular rate and rhythm without murmur.  NEUROLOGIC:  Mental status. She is awake, alert, and fully oriented. Speech  is fluent and not dysarthric. She has no defects to confrontational naming  and can repeat a phrase. Mood is euthymic and affect appropriate. Cranial  nerves:  Pupils are equal and reactive. Extraocular movements are full. She  has congenital gaze evoked nystagmus in all directions along with a left  exotropia accentuated by up gaze. Visual fields are full to confrontation.  Face, tongue, and palette move normally and symmetrically. There is  diminished pinprick sensation on the right side of the face, clear to the  left. Motor:  Normal bulk and tone. Normal strength in all tested extremity  muscles. Sensation intact to pinprick and light touch to all extremities.  Coordination and rapid movements are performed well. Finger-to-nose and heel-  to-shin are performed well. Gait. She arises from the bed easily and her  stance is normal. She is able to heel and toe walk, but has some trouble  with tandem walking. Reflexes are 2+ and symmetric. Toes are downgoing.   LABORATORY DATA:  Admission CBC remarkable for a low hemoglobin of 8.9, low  MCV of 72. Glucose on admission was 118, today is 106. BUN and creatinine  today are 7 and 0.8, respectively. A 2-D echocardiogram is pending. EKG from  February 28 demonstrated questionable lateral ischemia. Normal sinus rhythm.  No acute injury. Telemetry monitoring has been  unremarkable. Carotid  Dopplers performed yesterday were unremarkable. MRI of the brain performed  with and without contrast as well as MRA of the brain performed yesterday is  personally reviewed and it demonstrates a small acute infarct in the middle  of the left thalamus with fairly extensive small vessel white matter  changes, but no old cortical infarcts, significant atrophy, or other  abnormalities. The MRA actually looks quite good.   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 Primary risk factor at this point is  hypertension. She does have mildly elevated blood sugar as well as mildly  elevated LDL at 106. Her main problem has been medical noncompliance and the  fact that she is not regularly followed by a physician.   RECOMMENDATIONS:  She needs to be treated with antiplatelet therapy and  aspirin would be sufficient, however, given that she is having bigger  problems with bleeding we will hold this until she has her hysterectomy.  This needs to be done very promptly. Would follow-up echo, but doubt that  this would show anything that would require any treatment. She needs to be  on a low-fat, low-salt, low-cholesterol diet. Consider nutrition or a  diabetes coordinator to discuss this with her. Consider statin to treat her  LDL down to 100 or less, although this might be achieved with diet. Again,  what she probably really needs is a primary physician for long term risk  factor control. I would strongly suggest that she be set up with someone for  that, perhaps internal medicine residents clinic. We will see back as  needed.   Thank you for the consultation.                                               Michael L. Thad Ranger, M.D.    MLR/MEDQ  D:  10/24/2003  T:  10/24/2003  Job:  [id number]

## 2011-01-10 NOTE — H&P (Signed)
NAME:  [name], [name]                     ACCOUNT NO.:  [id number]   MEDICAL RECORD NO.:  [id number]                   PATIENT TYPE:  INP   LOCATION:  3032                                 FACILITY:  [facility]   PHYSICIAN:  Melvyn Novas, M.D.               DATE OF BIRTH:  23-Jan-1946   DATE OF ADMISSION:  12/20/2003  DATE OF DISCHARGE:                                HISTORY & PHYSICAL   This patient is a 65 year old African American female, born on April 05, 1946, with a history of cerebrovascular disease who suffered just in  February 2005, a left thalamic infarction.  The patient has a history of  hypertension and medical noncompliance.  Dr. Marlan Palau consultation  notes that the patient is left-handed.  She has a history of hypertension,  asthma, albinism, legal blindness, borderline diabetes, and left thalamic  stroke, as mentioned above. She was admitted for a hysterectomy under spinal  anesthesia on this Friday and was noticed that her systolic blood pressure  dropped into the 110 systolic range from previously 190.  Apparently, at  that time, the patient also had ST segment changes on EKG which led to  consultation of Dr. Diona Browner, [facility], to evaluate for possible  infarction.  He had diagnosed further moderate to severe left ventricular  hypertrophy with an ejection fraction of 65-75%, and the patient is  currently managed with Lisinopril.  She also had shown a borderline  elevation of LDL cholesterol of 108.  The patient was felt to have small  cerebral vessel disease in the past, and these changes might have been due  to her hypertension - borderline diabetes.  The patient comes in, at this  point, six days prior to surgery she began noticing some trouble with right  facial weakness and some slurred speech which seemed to have improved but  worsened again the day after surgery.  The patient felt weak all over and  had no focal weakness according to Dr.  Anne Hahn.  The patient had some  tingling sensation in both upper extremities.  A CT scan on December 12, 2003  showed no acute change, but an MRI showed two new fresh strokes on the right  pons, left deep white matter of the parietal lobe, and these were clearly  separate events from an already organized stroke, ischemic infarction in the  left thalamus that she suffered in February.  She was, therefore,  transferred to the stroke M.D. service at [facility] today.   PAST MEDICAL HISTORY:  1. Left thalamic infarction.  2. Hypertension.  3. Iron deficiency anemia.  4. Anemia from fibroid-related menorrhagia.  5. History of albinism.  6. Asthma.  7. Obesity.  8. Legal blindness.  9. Congenital nystagmus.  10.      Questionable hyperlipidemia.   ALLERGIES:  No known drug allergies.   SOCIAL HISTORY:  The patient does not drink alcohol and does  not smoke.   MEDICATIONS:  1. Aspirin 325 mg a day.  2. Hydrochlorothiazide 25 mg a day.  3. Advair one puff b.i.d.  4. Protonix 40 mg daily.  5. Lisinopril 10 mg b.i.d.  6. Simethicone a.c. and h.s.  7. Albuterol inhaler every four hours or as needed.  8. Catapres 0.1 q.6h. p.r.n.  9. Benadryl p.r.n.  10.      Reglan p.r.n.  11.      Tylox for pain.  12.      Ambien p.r.n. for sleep.   FAMILY HISTORY:  Mother is alive with heart disease, hypertension.  Father  died of an MI.   SOCIAL HISTORY:  The patient is widowed, lives alone in the [place] area,  and has three children that are described as alive and well.  One sister has  breast disease.   REVIEW OF SYSTEMS:  Notable for no fevers or chills.  The patient has asthma  but feels currently that her lungs are clear.  She denies chest pain or  shortness of breath.  She has tingling in both hands but no focal weakness.  No visual changes could be described, but the patient is legally blind and  impaired at baseline.   PHYSICAL EXAMINATION:  VITAL SIGNS:  Blood pressure,  today, is 147/60, heart  rate is 68 appears regular, temperature is 98.9.  GENERAL:  The patient is a moderately obese, black female, alert and  cooperative at the time of examination.  NEUROLOGIC:  Cranial nerves as above.  Facial symmetry is not present, but  there is a depression of the right nasolabial fold that has been described  in earlier consults.  The patient has good sensation of the face to pin  prick.  Congenital nystagmus.  Visual field testing deferred.  Motor testing  shows 5/5 strength and an antigravity in all four extremities.  She has good  grip and pinch strength.  Sensory testing is intact to primary modalities.  Drift is not present.  Deep tendon reflexes are symmetric and at a 1+ level  with downgoing toes bilateral to plantar stimulation.   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  Hypertension and obesity are her main contributing factors.   PLAN:  I would like to place the patient on aspirin therapy as IV heparin is  probably not safe so soon after her hysterectomy.  MRI of the brain is  deferred unless we can remove the surgical staples.  The patient will be  undergoing a 2D echocardiogram today and carotid Doppler studies.  She will  be followed by Dr. Marlan Palau or Dr. Sunny Schlein. Sethi on the stroke  team.                                                Melvyn Novas, M.D.    CD/MEDQ  D:  12/20/2003  T:  12/20/2003  Job:  [id number]   cc:   Jonelle Sidle, M.D. [facility]   Charles A. Clearance Coots, M.D.   Candyce Churn. Allyne Gee, M.D.  [address]  [address]  [place]  [place] [address]  Fax: [phone]

## 2011-01-10 NOTE — Discharge Summary (Signed)
NAME:  [name], [name]                     ACCOUNT NO.:  [id number]   MEDICAL RECORD NO.:  [id number]                   PATIENT TYPE:  INP   LOCATION:  3032                                 FACILITY:  [facility]   PHYSICIAN:  Pramod P. Pearlean Brownie, MD                 DATE OF BIRTH:  [birth year]   DATE OF ADMISSION:  12/15/2003  DATE OF DISCHARGE:  12/21/2003                                 DISCHARGE SUMMARY   ADMISSION DIAGNOSES:  1. Symptomatic uterine fibroid.  2. Menorrhagia.   DISCHARGE DIAGNOSES:  1. Transient dysarthria and right facial weakness possibly from small     pontine infarction.  2. Recent left subcortical infarction.  3. Hypertension.  4. Obesity.  5. Hyperlipidemia.   HOSPITAL COURSE:  The patient is a 65 year old African-American lady with  multiple problems who was admitted for elective surgery for uterine fibroid  bleeding on December 15, 2003, to [facility].  She underwent surgery and  postoperatively complained of some slurred speech and right facial weakness.  Marlan Palau, M.D. was consulted on December 18, 2003, and his consultation  was not clear whether the patient had a new focal symptoms or now since the  patient had noticed some transient right facial weakness even a few days  before the surgery.  The patient apparently had similar symptoms in February  of 2005 and she had suffered left thalamic/basal ganglia infarction.  The  patient was worked up as an outpatient at that time by Casimiro Needle L. Reynolds,  M.D. and had undergone MRI, MRA, and echocardiogram studies and was found to  have small vessel disease.  She was placed on aspirin for stroke prevention.  Upon request from the OB/GYN physician, the patient was transferred to the  neurology service at [facility] for further stroke workup.  An MRA  of the brain and the neck was obtained, both of which did not reveal  significant vascular stenosis.  Carotid ultrasound showed no significant  stenosis.  Transcranial Doppler studies revealed normal mean flow  velocities.  Telemetry monitoring did not reveal any cardiac source of  arrhythmia.  The patient was seen in consultation by [facility] and  underwent dobutamine Cardiolite stress study on December 19, 2003, which was  negative for any reversible cardiac arrhythmia.  She was advised to follow  up electively as an outpatient with Olga Millers, M.D. [facility], cardiologist,  for further cardiac evaluation if necessary.  A two-dimensional  echocardiogram was performed which showed normal ejection fraction without  any cardiac source of embolism.  The patient symptoms improved and on the  day of admission she did not have facial asymmetry or any dysarthria.  She  was discharged home in stable condition.   DISCHARGE MEDICATIONS:  1. Aggrenox one capsule once a day for seven days, increase as tolerated to     twice a day.  2. Advair one puff twice a day.  3.  Hydrochlorothiazide 25 mg a day.  4. Lisinopril 10 mg a day.  5. Protonix 40 mg a day.   DISCHARGE INSTRUCTIONS:  She was asked not to do any strenuous activity.  She was placed on a low salt diet and advised wound care for her recent  surgery.  She was asked to see Fleet Contras, M.D. in one month and Dr.  Clearance Coots, family physician, as needed.  She had an operation appointment with  [facility] on May 10.  She was advised to see Dr. Pearlean Brownie in his  office for stroke in two months.                                                Pramod P. Pearlean Brownie, MD    PPS/MEDQ  D:  03/04/2004  T:  03/04/2004  Job:  [id number]

## 2011-01-10 NOTE — Op Note (Signed)
NAME:  [name], [name]                     ACCOUNT NO.:  [id number]   MEDICAL RECORD NO.:  [id number]                   PATIENT TYPE:  INP   LOCATION:  9372                                 FACILITY:  [facility]   PHYSICIAN:  Charles A. Clearance Coots, M.D.             DATE OF BIRTH:  Sep 22, 1945   DATE OF PROCEDURE:  12/15/2003  DATE OF DISCHARGE:                                 OPERATIVE REPORT   PREOPERATIVE DIAGNOSES:  Symptomatic uterine fibroids, menorrhagia, history  of anemia.   POSTOPERATIVE DIAGNOSES:  Symptomatic uterine fibroids, menorrhagia, history  of anemia.   PROCEDURE:  Total abdominal hysterectomy, bilateral salpingo-oophorectomy.   SURGEON:  Charles A. Clearance Coots, M.D.   ASSISTANT:  Roseanna Rainbow, M.D.   ANESTHESIA:  Epidural-general.   ESTIMATED BLOOD LOSS:  200 mL.    IV fluids 2400 mL,  urine out 50 mL.   COMPLICATIONS:  None.   DRAINS:  Foley to gravity.   FINDINGS:  Uterus with fibroids, normal ovaries and fallopian tubes.   DESCRIPTION OF PROCEDURE:  The patient was brought to the operating room and  after satisfactory epidural anesthesia, the patient was placed in the  recumbent position and the legs were placed in pneumatic compression  stockings per protocol.  The abdomen was prepped and draped in the usual  sterile fashion.  An indwelling Foley catheter was placed.  A vertical  incision was then made on the abdomen through the previous scar with the  scalpel down to the fascia. The fascia was nicked in the midline and the  fascia was extended superiorly and inferiorly with curved Mayo scissors. The  peritoneum was entered digitally. The peritoneum was extended superiorly and  inferiorly with curved Mayo scissors and Metzenbaum scissors down near the  urinary bladder being careful to avoid the urinary bladder. The Lenox Ahr retractor was then placed in the incision. The bowel was packed  off, anterior and posterior blades were placed.   The uterus were elevated  and the uteroovarian and round ligaments were clamped with long Kelly  forceps.  The round ligaments were transected with cautery bilaterally and  the anterior peritoneum was resected with cautery anteriorly separating the  vesicouterine fold of peritoneum away from the urinary bladder and pushing  the urinary bladder down away from the operative field.  Posterior leaf of  peritoneum from the broad ligament was then transected posteriorly and down  to the infundibulopelvic vessels.  A window was made in the peritoneum  isolating the infundibulopelvic vessels.  The ureter was identified on both  sides prior to doubly clamping the infundibulopelvic vessels and the vessels  were then cut in between the clamps and a free tie of #0 Vicryl was placed  beneath the clamp and this was followed by a suture ligation of transfixion  suture of #0 Vicryl above the knot. The same procedure was performed on the  opposite side without complications. The uterine vessels were  then  skeletonized and the uterine vessels were doubly clamped bilaterally and cut  in between the clamps and suture ligated with transfixion sutures of #0  Vicryl.  The uterine fundus because of its large size was then transected  away from the cervical stump and submitted to pathology for evaluation along  with the ovaries and fallopian tubes.  The cervical stump was grasped with  Kocher forceps and the cardinal ligaments were clamped bilaterally with  straight parametrial clamps and suture ligated with transfixion suture of #0  Vicryl.  Next, curved parametrial clamps were placed across the uterosacral  ligaments bilaterally and cut and suture ligated with transfixion sutures of  #0 Vicryl. Curved parametrial clamps were then placed across the vaginal  cuff meeting in the center and the cervical stump was transected away from  the vagina and submitted to pathology for evaluation. The vagina was then  closed  with transfixion suture in each corner beneath the clamp and the  center of the vaginal cuff was closed with figure-of-eight sutures of #0  Vicryl.  Hemostasis was excellent. The pelvic cavity was thoroughly  irrigated with warm saline solution. The vascular stumps were examined for  hemostasis and there was no active bleeding noted.  The surgical technician  indicated that all needle, sponge and instrument counts were correct.  The  O'Connor-O'Sullivan retractor was then removed and all packing was removed.  The abdomen was then closed as follows:  The peritoneum and fascia was  closed as one with a continuous suture of #0 PDS from each corner to the  center. The subcutaneous tissue was thoroughly irrigated with warm saline  solution and all areas of subcutaneous bleeding were coagulated with the  Bovie.  The skin was then approximated with surgical stainless steel  staples. Sterile bandages applied to the incision closure. The surgical  technician indicated again that the needle, sponge and instrument counts  were correct x2.  The patient tolerated the procedure well and was  transported to the recovery room in satisfactory condition.                                               Charles A. Clearance Coots, M.D.    CAH/MEDQ  D:  12/15/2003  T:  12/16/2003  Job:  [id number]   cc:   Dr. Mardelle Matte

## 2011-01-28 ENCOUNTER — Encounter: Payer: Self-pay | Admitting: Internal Medicine

## 2011-02-15 ENCOUNTER — Other Ambulatory Visit: Payer: Self-pay | Admitting: Internal Medicine

## 2011-02-21 ENCOUNTER — Encounter: Payer: Self-pay | Admitting: Internal Medicine

## 2011-02-25 ENCOUNTER — Encounter: Payer: Self-pay | Admitting: Internal Medicine

## 2011-02-25 ENCOUNTER — Ambulatory Visit (INDEPENDENT_AMBULATORY_CARE_PROVIDER_SITE_OTHER): Payer: Medicare PPO | Admitting: Internal Medicine

## 2011-02-25 DIAGNOSIS — J45909 Unspecified asthma, uncomplicated: Secondary | ICD-10-CM

## 2011-02-25 DIAGNOSIS — I10 Essential (primary) hypertension: Secondary | ICD-10-CM

## 2011-02-25 DIAGNOSIS — Z8679 Personal history of other diseases of the circulatory system: Secondary | ICD-10-CM

## 2011-02-25 DIAGNOSIS — I2510 Atherosclerotic heart disease of native coronary artery without angina pectoris: Secondary | ICD-10-CM

## 2011-02-25 LAB — LIPID PANEL
HDL: 57.2 mg/dL (ref >39.00)
VLDL: 12.6 mg/dL (ref 0.0–40.0)

## 2011-02-25 LAB — HEPATIC FUNCTION PANEL
AST: 23 U/L (ref 0–37)
Albumin: 3.9 g/dL (ref 3.5–5.2)
Alkaline Phosphatase: 88 U/L (ref 39–117)
Bilirubin, Direct: 0.1 mg/dL (ref 0.0–0.3)

## 2011-02-25 LAB — CBC WITH DIFFERENTIAL/PLATELET
Basophils Relative: 0.7 % (ref 0.0–3.0)
Eosinophils Relative: 6.6 % — ABNORMAL HIGH (ref 0.0–5.0)
Hemoglobin: 12.5 g/dL (ref 12.0–15.0)
Lymphocytes Relative: 38.8 % (ref 12.0–46.0)
Monocytes Relative: 7 % (ref 3.0–12.0)
Neutro Abs: 2.3 10*3/uL (ref 1.4–7.7)
Neutrophils Relative %: 46.9 % (ref 43.0–77.0)
RBC: 4.49 Mil/uL (ref 3.87–5.11)
WBC: 5 10*3/uL (ref 4.5–10.5)

## 2011-02-25 LAB — BASIC METABOLIC PANEL
BUN: 14 mg/dL (ref 6–23)
CO2: 32 mEq/L (ref 19–32)
Chloride: 109 mEq/L (ref 96–112)
GFR: 108.73 mL/min (ref >60.00)
Glucose, Bld: 101 mg/dL — ABNORMAL HIGH (ref 70–99)
Potassium: 4.4 mEq/L (ref 3.5–5.1)
Sodium: 146 mEq/L — ABNORMAL HIGH (ref 135–145)

## 2011-02-25 MED ORDER — SIMVASTATIN 20 MG PO TABS: 20.0000 mg | ORAL_TABLET | Freq: Every day | ORAL | Status: DC

## 2011-02-25 MED ORDER — CHLORTHALIDONE 25 MG PO TABS: 25.0000 mg | ORAL_TABLET | Freq: Every day | ORAL | Status: DC

## 2011-02-25 MED ORDER — ALBUTEROL SULFATE HFA 108 (90 BASE) MCG/ACT IN AERS: 1.0000 | INHALATION_SPRAY | Freq: Four times a day (QID) | RESPIRATORY_TRACT | Status: DC | PRN

## 2011-02-25 MED ORDER — FLUTICASONE-SALMETEROL 500-50 MCG/DOSE IN AEPB: 1.0000 | INHALATION_SPRAY | Freq: Two times a day (BID) | RESPIRATORY_TRACT | Status: DC

## 2011-02-25 MED ORDER — AMLODIPINE-OLMESARTAN 5-20 MG PO TABS: 1.0000 | ORAL_TABLET | Freq: Every day | ORAL | Status: DC

## 2011-02-25 MED ORDER — CLONIDINE HCL 0.2 MG PO TABS: 0.2000 mg | ORAL_TABLET | Freq: Two times a day (BID) | ORAL | Status: DC

## 2011-02-25 NOTE — Patient Instructions (Signed)
Please check your blood pressure on a regular basis.  If it is consistently greater than 150/90, please make an office appointment.  Limit your sodium (Salt) intake  You need to lose weight.  Consider a lower calorie diet and regular exercise.    It is important that you exercise regularly, at least 20 minutes 3 to 4 times per week.  If you develop chest pain or shortness of breath seek  medical attention.  Return in one month for follow-up

## 2011-02-25 NOTE — Progress Notes (Signed)
  Subjective:    Patient ID: [name], female    DOB: 10-29-1945, 65 y.o.   MRN: [id number]  HPI  65 year old patient who has a history of hypertension asthma cerebrovascular and coronary artery disease. She has not been seen here in over one year. She is taking samples of Azor which she states she is taking faithfully.  She denies any cardiopulmonary complaints she remains on her inhalational medications for her asthma which apparently has been stable. She states that she was seen at the dentist for a total extraction and her blood pressure was quite high. She remains on simvastatin for dyslipidemia. She states that she has been eating out and no longer cooks for herself and there's been some weight gain.  Wt Readings from Last 3 Encounters:  02/25/11 250 lb (113.399 kg)  10/25/09 240 lb (108.863 kg)  09/12/09 240 lb (108.863 kg)    Review of Systems  Constitutional: Negative.   HENT: Negative for hearing loss, congestion, sore throat, rhinorrhea, dental problem, sinus pressure and tinnitus.   Eyes: Negative for pain, discharge and visual disturbance.  Respiratory: Negative for cough and shortness of breath.   Cardiovascular: Negative for chest pain, palpitations and leg swelling.  Gastrointestinal: Negative for nausea, vomiting, abdominal pain, diarrhea, constipation, blood in stool and abdominal distention.  Genitourinary: Negative for dysuria, urgency, frequency, hematuria, flank pain, vaginal bleeding, vaginal discharge, difficulty urinating, vaginal pain and pelvic pain.  Musculoskeletal: Negative for joint swelling, arthralgias and gait problem.  Skin: Negative for rash.  Neurological: Negative for dizziness, syncope, speech difficulty, weakness, numbness and headaches.  Hematological: Negative for adenopathy.  Psychiatric/Behavioral: Negative for behavioral problems, dysphoric mood and agitation. The patient is not nervous/anxious.        Objective:   Physical Exam    Constitutional: She is oriented to person, place, and time. She appears well-developed and well-nourished. No distress.       164/100  HENT:  Head: Normocephalic.  Right Ear: External ear normal.  Left Ear: External ear normal.  Mouth/Throat: Oropharynx is clear and moist.  Eyes: Conjunctivae and EOM are normal. Pupils are equal, round, and reactive to light.  Neck: Normal range of motion. Neck supple. No thyromegaly present.  Cardiovascular: Normal rate, regular rhythm and intact distal pulses.   Murmur heard.      Grade 2/6 systolic murmur at the base  Pulmonary/Chest: Effort normal and breath sounds normal.  Abdominal: Soft. Bowel sounds are normal. She exhibits no mass. There is no tenderness.  Musculoskeletal: Normal range of motion. She exhibits edema.       +1 pedal edema  Lymphadenopathy:    She has no cervical adenopathy.  Neurological: She is alert and oriented to person, place, and time.  Skin: Skin is warm and dry. No rash noted.  Psychiatric: She has a normal mood and affect. Her behavior is normal.          Assessment & Plan:    Hypertension. Poor control. Will add diuretic therapy Asthma stable medications refilled Coronary artery disease Cerebrovascular disease  Laboratory update will be reviewed Chlorthalidone 25 mg daily will be added to her regimen We'll recheck in 4 weeks

## 2011-03-12 ENCOUNTER — Ambulatory Visit (INDEPENDENT_AMBULATORY_CARE_PROVIDER_SITE_OTHER): Payer: Medicare PPO | Admitting: Internal Medicine

## 2011-03-12 ENCOUNTER — Encounter: Payer: Self-pay | Admitting: Internal Medicine

## 2011-03-12 DIAGNOSIS — Z8679 Personal history of other diseases of the circulatory system: Secondary | ICD-10-CM

## 2011-03-12 DIAGNOSIS — I2510 Atherosclerotic heart disease of native coronary artery without angina pectoris: Secondary | ICD-10-CM

## 2011-03-12 DIAGNOSIS — I10 Essential (primary) hypertension: Secondary | ICD-10-CM

## 2011-03-12 NOTE — Progress Notes (Signed)
  Subjective:    Patient ID: [name], female    DOB: [birth year], 65 y.o.   MRN: [id number]  HPI  65 year old patient who is seen today complaining of redness involving the left axilla and left upper inner arm. This was more problematic  yesterday but has greatly improved today. There is some associated tenderness with the redness. She has multi-drug-resistant hypertension and a history of cerebral vascular disease she has coronary artery disease. She has been compliant with her medication. Blood pressure 140/80 on repeat    Review of Systems  Skin: Positive for rash.       Objective:   Physical Exam  Constitutional: She is oriented to person, place, and time. She appears well-developed and well-nourished.       Obese. No distress. Repeat blood pressure 140/80  HENT:  Head: Normocephalic.  Right Ear: External ear normal.  Left Ear: External ear normal.  Mouth/Throat: Oropharynx is clear and moist.  Eyes: Conjunctivae and EOM are normal. Pupils are equal, round, and reactive to light.  Neck: Normal range of motion. Neck supple. No thyromegaly present.  Cardiovascular: Normal rate, regular rhythm, normal heart sounds and intact distal pulses.   Pulmonary/Chest: Effort normal and breath sounds normal.  Abdominal: Soft. Bowel sounds are normal. She exhibits no mass. There is no tenderness.  Musculoskeletal: Normal range of motion.  Lymphadenopathy:    She has no cervical adenopathy.  Neurological: She is alert and oriented to person, place, and time.  Skin: Skin is warm and dry. Rash noted.       Very minimal erythema was noted involving primarily the left upper inner arm and axilla was clear. No abscess or obvious cellulitis noted  Psychiatric: She has a normal mood and affect. Her behavior is normal.          Assessment & Plan:   Hypertension fairly stable Mild heat rash. No cellulitis. We'll clinically observe at this time  Samples were dispensed Recheck 3  months

## 2011-03-12 NOTE — Patient Instructions (Signed)
Limit your sodium (Salt) intake  Please check your blood pressure on a regular basis.  If it is consistently greater than 150/90, please make an office appointment.  Return in 3 months for follow-up  

## 2011-03-26 ENCOUNTER — Ambulatory Visit: Payer: Medicare PPO | Admitting: Internal Medicine

## 2011-05-30 LAB — DIFFERENTIAL
Basophils Absolute: 0 K/uL (ref 0.0–0.1)
Basophils Relative: 0 % (ref 0–1)
Eosinophils Absolute: 0.1 10*3/uL (ref 0.0–0.7)
Eosinophils Relative: 1 % (ref 0–5)
Lymphocytes Relative: 11 % — ABNORMAL LOW (ref 12–46)
Lymphs Abs: 0.9 K/uL (ref 0.7–4.0)
Monocytes Absolute: 0.1 K/uL (ref 0.1–1.0)
Monocytes Relative: 1 % — ABNORMAL LOW (ref 3–12)
Neutro Abs: 7.4 K/uL (ref 1.7–7.7)
Neutrophils Relative %: 88 % — ABNORMAL HIGH (ref 43–77)

## 2011-05-30 LAB — CBC
HCT: 40.2 % (ref 36.0–46.0)
HCT: 41.3 % (ref 36.0–46.0)
HCT: 41.5 % (ref 36.0–46.0)
HCT: 41.8 % (ref 36.0–46.0)
HCT: 42 % (ref 36.0–46.0)
HCT: 42.7 % (ref 36.0–46.0)
Hemoglobin: 12.7 g/dL (ref 12.0–15.0)
Hemoglobin: 13 g/dL (ref 12.0–15.0)
Hemoglobin: 13.1 g/dL (ref 12.0–15.0)
Hemoglobin: 13.3 g/dL (ref 12.0–15.0)
Hemoglobin: 13.4 g/dL (ref 12.0–15.0)
Hemoglobin: 13.4 g/dL (ref 12.0–15.0)
MCHC: 31.3 g/dL (ref 30.0–36.0)
MCHC: 31.6 g/dL (ref 30.0–36.0)
MCHC: 31.7 g/dL (ref 30.0–36.0)
MCHC: 31.9 g/dL (ref 30.0–36.0)
MCHC: 32 g/dL (ref 30.0–36.0)
MCV: 82.6 fL (ref 78.0–100.0)
MCV: 82.6 fL (ref 78.0–100.0)
MCV: 83.8 fL (ref 78.0–100.0)
MCV: 84 fL (ref 78.0–100.0)
Platelets: 177 10*3/uL (ref 150–400)
Platelets: 180 10*3/uL (ref 150–400)
Platelets: 182 10*3/uL (ref 150–400)
Platelets: 201 10*3/uL (ref 150–400)
Platelets: 207 10*3/uL (ref 150–400)
RBC: 4.86 MIL/uL (ref 3.87–5.11)
RBC: 4.88 MIL/uL (ref 3.87–5.11)
RBC: 4.95 MIL/uL (ref 3.87–5.11)
RBC: 5.02 MIL/uL (ref 3.87–5.11)
RBC: 5.08 MIL/uL (ref 3.87–5.11)
RDW: 16 % — ABNORMAL HIGH (ref 11.5–15.5)
RDW: 16 % — ABNORMAL HIGH (ref 11.5–15.5)
RDW: 16.1 % — ABNORMAL HIGH (ref 11.5–15.5)
RDW: 16.3 % — ABNORMAL HIGH (ref 11.5–15.5)
RDW: 16.5 % — ABNORMAL HIGH (ref 11.5–15.5)
RDW: 16.8 % — ABNORMAL HIGH (ref 11.5–15.5)
WBC: 8.4 K/uL (ref 4.0–10.5)
WBC: 9.3 10*3/uL (ref 4.0–10.5)
WBC: 9.8 10*3/uL (ref 4.0–10.5)

## 2011-05-30 LAB — CK TOTAL AND CKMB (NOT AT ARMC)
CK, MB: 48.1 ng/mL — ABNORMAL HIGH (ref 0.3–4.0)
CK, MB: 6.6 ng/mL — ABNORMAL HIGH (ref 0.3–4.0)
CK, MB: 6.8 ng/mL — ABNORMAL HIGH (ref 0.3–4.0)
Relative Index: 0.9 (ref 0.0–2.5)
Relative Index: 1 (ref 0.0–2.5)
Relative Index: 3.6 — ABNORMAL HIGH (ref 0.0–2.5)
Total CK: 756 U/L — ABNORMAL HIGH (ref 7–177)
Total CK: 924 U/L — ABNORMAL HIGH (ref 7–177)

## 2011-05-30 LAB — URINE CULTURE: Colony Count: 30000

## 2011-05-30 LAB — LIPID PANEL
HDL: 52 mg/dL (ref >39)
Total CHOL/HDL Ratio: 4.3 RATIO
Triglycerides: 30 mg/dL (ref <150)
VLDL: 6 mg/dL (ref 0–40)

## 2011-05-30 LAB — BASIC METABOLIC PANEL
BUN: 26 mg/dL — ABNORMAL HIGH (ref 6–23)
BUN: 8 mg/dL (ref 6–23)
CO2: 29 mEq/L (ref 19–32)
CO2: 29 mEq/L (ref 19–32)
CO2: 30 mEq/L (ref 19–32)
Calcium: 9.1 mg/dL (ref 8.4–10.5)
Chloride: 102 mEq/L (ref 96–112)
Chloride: 106 mEq/L (ref 96–112)
Chloride: 99 mEq/L (ref 96–112)
Creatinine, Ser: 0.67 mg/dL (ref 0.4–1.2)
Creatinine, Ser: 0.9 mg/dL (ref 0.4–1.2)
GFR calc Af Amer: >60 mL/min (ref >60)
GFR calc non Af Amer: >60 mL/min (ref >60)
Glucose, Bld: 157 mg/dL — ABNORMAL HIGH (ref 70–99)
Glucose, Bld: 159 mg/dL — ABNORMAL HIGH (ref 70–99)
Glucose, Bld: 163 mg/dL — ABNORMAL HIGH (ref 70–99)
Potassium: 4.1 mEq/L (ref 3.5–5.1)
Potassium: 4.9 mEq/L (ref 3.5–5.1)
Sodium: 137 mEq/L (ref 135–145)
Sodium: 138 mEq/L (ref 135–145)

## 2011-05-30 LAB — POCT I-STAT 3, ART BLOOD GAS (G3+)
TCO2: 34 mmol/L (ref 0–100)
pCO2 arterial: 57.4 mmHg (ref 35.0–45.0)
pH, Arterial: 7.355 (ref 7.350–7.400)
pO2, Arterial: 91 mmHg (ref 80.0–100.0)

## 2011-05-30 LAB — POCT I-STAT 3, VENOUS BLOOD GAS (G3P V)
Acid-Base Excess: 4 mmol/L — ABNORMAL HIGH (ref 0.0–2.0)
Bicarbonate: 30.4 mEq/L — ABNORMAL HIGH (ref 20.0–24.0)
O2 Saturation: 68 %
O2 Saturation: 76 %
TCO2: 33 mmol/L (ref 0–100)
pCO2, Ven: 54 mmHg — ABNORMAL HIGH (ref 45.0–50.0)
pCO2, Ven: 58.8 mmHg — ABNORMAL HIGH (ref 45.0–50.0)
pCO2, Ven: 59.1 mmHg — ABNORMAL HIGH (ref 45.0–50.0)
pO2, Ven: 43 mmHg (ref 30.0–45.0)
pO2, Ven: 45 mmHg (ref 30.0–45.0)

## 2011-05-30 LAB — BASIC METABOLIC PANEL WITH GFR
Calcium: 9.6 mg/dL (ref 8.4–10.5)
GFR calc Af Amer: >60 mL/min (ref >60)
GFR calc non Af Amer: >60 mL/min (ref >60)
Glucose, Bld: 163 mg/dL — ABNORMAL HIGH (ref 70–99)
Potassium: 4 meq/L (ref 3.5–5.1)
Sodium: 143 meq/L (ref 135–145)

## 2011-05-30 LAB — GLUCOSE, CAPILLARY
Glucose-Capillary: 113 mg/dL — ABNORMAL HIGH (ref 70–99)
Glucose-Capillary: 133 mg/dL — ABNORMAL HIGH (ref 70–99)
Glucose-Capillary: 144 mg/dL — ABNORMAL HIGH (ref 70–99)
Glucose-Capillary: 149 mg/dL — ABNORMAL HIGH (ref 70–99)
Glucose-Capillary: 178 mg/dL — ABNORMAL HIGH (ref 70–99)
Glucose-Capillary: 211 mg/dL — ABNORMAL HIGH (ref 70–99)
Glucose-Capillary: 221 mg/dL — ABNORMAL HIGH (ref 70–99)
Glucose-Capillary: 239 mg/dL — ABNORMAL HIGH (ref 70–99)
Glucose-Capillary: 244 mg/dL — ABNORMAL HIGH (ref 70–99)

## 2011-05-30 LAB — COMPREHENSIVE METABOLIC PANEL
ALT: 24 U/L (ref 0–35)
AST: 31 U/L (ref 0–37)
Albumin: 3.8 g/dL (ref 3.5–5.2)
CO2: 29 mEq/L (ref 19–32)
Chloride: 104 mEq/L (ref 96–112)
GFR calc Af Amer: >60 mL/min (ref >60)
GFR calc non Af Amer: >60 mL/min (ref >60)
Sodium: 141 mEq/L (ref 135–145)
Total Bilirubin: 0.4 mg/dL (ref 0.3–1.2)

## 2011-05-30 LAB — PROTIME-INR
INR: 1 (ref 0.00–1.49)
Prothrombin Time: 13 seconds (ref 11.6–15.2)
Prothrombin Time: 14 seconds (ref 11.6–15.2)

## 2011-05-30 LAB — CREATININE, SERUM
Creatinine, Ser: 0.92 mg/dL (ref 0.4–1.2)
GFR calc Af Amer: >60 mL/min (ref >60)
GFR calc non Af Amer: >60 mL/min (ref >60)

## 2011-05-30 LAB — B-NATRIURETIC PEPTIDE (CONVERTED LAB): Pro B Natriuretic peptide (BNP): 54 pg/mL (ref 0.0–100.0)

## 2011-05-30 LAB — URINALYSIS, ROUTINE W REFLEX MICROSCOPIC
Bilirubin Urine: NEGATIVE
Glucose, UA: 100 mg/dL — AB
Hgb urine dipstick: NEGATIVE
Ketones, ur: NEGATIVE mg/dL
Nitrite: NEGATIVE
pH: 7.5 (ref 5.0–8.0)

## 2011-05-30 LAB — TSH: TSH: 0.359 u[IU]/mL (ref 0.350–4.500)

## 2011-05-30 LAB — TROPONIN I
Troponin I: 0.04 ng/mL (ref 0.00–0.06)
Troponin I: 4.95 ng/mL (ref 0.00–0.06)

## 2011-05-30 LAB — URINE MICROSCOPIC-ADD ON

## 2011-05-30 LAB — HEPARIN LEVEL (UNFRACTIONATED): Heparin Unfractionated: 0.62 IU/mL (ref 0.30–0.70)

## 2011-06-11 ENCOUNTER — Telehealth: Payer: Self-pay

## 2011-06-11 ENCOUNTER — Ambulatory Visit (INDEPENDENT_AMBULATORY_CARE_PROVIDER_SITE_OTHER): Payer: Medicare PPO | Admitting: Internal Medicine

## 2011-06-11 ENCOUNTER — Encounter: Payer: Self-pay | Admitting: Internal Medicine

## 2011-06-11 DIAGNOSIS — I10 Essential (primary) hypertension: Secondary | ICD-10-CM

## 2011-06-11 DIAGNOSIS — J45909 Unspecified asthma, uncomplicated: Secondary | ICD-10-CM

## 2011-06-11 DIAGNOSIS — Z8679 Personal history of other diseases of the circulatory system: Secondary | ICD-10-CM

## 2011-06-11 DIAGNOSIS — I2510 Atherosclerotic heart disease of native coronary artery without angina pectoris: Secondary | ICD-10-CM

## 2011-06-11 MED ORDER — AMLODIPINE-OLMESARTAN 10-40 MG PO TABS: 1.0000 | ORAL_TABLET | Freq: Every day | ORAL | Status: DC

## 2011-06-11 NOTE — Progress Notes (Signed)
  Subjective:    Patient ID: [name], female    DOB: May 30, 1946, 65 y.o.   MRN: [id number]  HPI  65 year old patient who is seen today for followup. She has a history of multi-drug-resistant hypertension as well as a history of coronary artery disease and cerebrovascular disease. She claims compliance with her medication. 3 months ago her blood pressure was well-controlled. She has asthma which has been stable. She declines a flu vaccine today. She denies any exertional chest pain or any focal neurological deficits. She does not seem to have a clear understanding of how she takes her medications. All medications were refilled and discussed today.  Past Medical History  Diagnosis Date  . 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 ASTHMA 04/16/2007  . 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 CORONARY ARTERY DISEASE 08/29/2008  . HYPERTENSION 04/16/2007  . URI 10/25/2009   Past Surgical History  Procedure Date  . Cardiac catheterization     reports that she has never smoked. She has never used smokeless tobacco. She reports that she drinks alcohol. She reports that she does not use illicit drugs. family history is not on file. No Known Allergies     Review of Systems  Constitutional: Negative.   HENT: Negative for hearing loss, congestion, sore throat, rhinorrhea, dental problem, sinus pressure and tinnitus.   Eyes: Negative for pain, discharge and visual disturbance.  Respiratory: Negative for cough and shortness of breath.   Cardiovascular: Negative for chest pain, palpitations and leg swelling.  Gastrointestinal: Negative for nausea, vomiting, abdominal pain, diarrhea, constipation, blood in stool and abdominal distention.  Genitourinary: Negative for dysuria, urgency, frequency, hematuria, flank pain, vaginal bleeding, vaginal discharge, difficulty urinating, vaginal pain and pelvic pain.  Musculoskeletal: Negative for joint swelling, arthralgias and gait problem.  Skin: Negative for rash.    Neurological: Negative for dizziness, syncope, speech difficulty, weakness, numbness and headaches.  Hematological: Negative for adenopathy.  Psychiatric/Behavioral: Negative for behavioral problems, dysphoric mood and agitation. The patient is not nervous/anxious.        Objective:   Physical Exam  Constitutional: She is oriented to person, place, and time. She appears well-developed and well-nourished.       Blood pressure 170/100  HENT:  Head: Normocephalic.  Right Ear: External ear normal.  Left Ear: External ear normal.  Mouth/Throat: Oropharynx is clear and moist.  Eyes: Conjunctivae and EOM are normal. Pupils are equal, round, and reactive to light.  Neck: Normal range of motion. Neck supple. No thyromegaly present.  Cardiovascular: Normal rate, regular rhythm, normal heart sounds and intact distal pulses.   Pulmonary/Chest: Effort normal and breath sounds normal.  Abdominal: Soft. Bowel sounds are normal. She exhibits no mass. There is no tenderness.  Musculoskeletal: Normal range of motion.  Lymphadenopathy:    She has no cervical adenopathy.  Neurological: She is alert and oriented to person, place, and time.  Skin: Skin is warm and dry. No rash noted.  Psychiatric: She has a normal mood and affect. Her behavior is normal.          Assessment & Plan:   Hypertension. Suboptimal control today. All medications were refilled home blood pressure monitoring discussed. The patient Azor will be increased to 10- 40 daily. Return in 3 weeks for followup Dyslipidemia. Will continue present dose of simvastatin Asthma stable samples provided

## 2011-06-11 NOTE — Telephone Encounter (Signed)
Call from [facility] - azor not covered by ins - need alternative Please advise

## 2011-06-11 NOTE — Patient Instructions (Signed)
Limit your sodium (Salt) intake  Please check your blood pressure on a regular basis.  If it is consistently greater than 150/90, please make an office appointment.  AZOR  10-40  Daily  You need to lose weight.  Consider a lower calorie diet and regular exercise.

## 2011-06-12 MED ORDER — AMLODIPINE BESYLATE 10 MG PO TABS: 10.0000 mg | ORAL_TABLET | Freq: Every day | ORAL | Status: DC

## 2011-06-12 NOTE — Telephone Encounter (Signed)
Amlodipine 10 mg #90 one daily Generic Cozaar 100 #90 one daily

## 2011-06-13 ENCOUNTER — Telehealth: Payer: Self-pay | Admitting: *Deleted

## 2011-06-13 NOTE — Telephone Encounter (Signed)
Pt's son called stating his mother had a CVA and is hospitalized [place].  Will be discharged tomorrow, and she wants to be sure she gets back to Dr. Kirtland Bouchard.  Advised to call us as soon as she gets home for a post hosp appt.

## 2011-06-15 ENCOUNTER — Emergency Department (HOSPITAL_COMMUNITY): Payer: Medicare PPO

## 2011-06-15 ENCOUNTER — Observation Stay (HOSPITAL_COMMUNITY)
Admission: EM | Admit: 2011-06-15 | Discharge: 2011-06-16 | Disposition: A | Discharge status: Home / Self Care | Payer: Medicare PPO | Attending: Internal Medicine | Admitting: Internal Medicine

## 2011-06-15 DIAGNOSIS — X58XXXA Exposure to other specified factors, initial encounter: Secondary | ICD-10-CM | POA: Insufficient documentation

## 2011-06-15 DIAGNOSIS — T783XXA Angioneurotic edema, initial encounter: Principal | ICD-10-CM | POA: Insufficient documentation

## 2011-06-15 DIAGNOSIS — Y92009 Unspecified place in unspecified non-institutional (private) residence as the place of occurrence of the external cause: Secondary | ICD-10-CM | POA: Insufficient documentation

## 2011-06-15 DIAGNOSIS — E119 Type 2 diabetes mellitus without complications: Secondary | ICD-10-CM | POA: Insufficient documentation

## 2011-06-15 DIAGNOSIS — J4489 Other specified chronic obstructive pulmonary disease: Secondary | ICD-10-CM | POA: Insufficient documentation

## 2011-06-15 DIAGNOSIS — I10 Essential (primary) hypertension: Secondary | ICD-10-CM | POA: Insufficient documentation

## 2011-06-15 DIAGNOSIS — D509 Iron deficiency anemia, unspecified: Secondary | ICD-10-CM | POA: Insufficient documentation

## 2011-06-15 DIAGNOSIS — J449 Chronic obstructive pulmonary disease, unspecified: Secondary | ICD-10-CM | POA: Insufficient documentation

## 2011-06-15 DIAGNOSIS — I6990 Unspecified sequelae of unspecified cerebrovascular disease: Secondary | ICD-10-CM | POA: Insufficient documentation

## 2011-06-15 DIAGNOSIS — H548 Legal blindness, as defined in USA: Secondary | ICD-10-CM | POA: Insufficient documentation

## 2011-06-15 LAB — POCT I-STAT TROPONIN I

## 2011-06-15 LAB — COMPREHENSIVE METABOLIC PANEL
AST: 27 U/L (ref 0–37)
CO2: 27 mEq/L (ref 19–32)
Calcium: 10.2 mg/dL (ref 8.4–10.5)
Creatinine, Ser: 0.73 mg/dL (ref 0.50–1.10)
GFR calc Af Amer: >90 mL/min (ref >90)
GFR calc non Af Amer: 89 mL/min — ABNORMAL LOW (ref >90)
Glucose, Bld: 146 mg/dL — ABNORMAL HIGH (ref 70–99)
Total Protein: 7.8 g/dL (ref 6.0–8.3)

## 2011-06-15 LAB — POCT I-STAT, CHEM 8
BUN: 17 mg/dL (ref 6–23)
Chloride: 102 mEq/L (ref 96–112)
Creatinine, Ser: 0.9 mg/dL (ref 0.50–1.10)
Glucose, Bld: 116 mg/dL — ABNORMAL HIGH (ref 70–99)
Potassium: 3.4 mEq/L — ABNORMAL LOW (ref 3.5–5.1)
Sodium: 140 mEq/L (ref 135–145)

## 2011-06-15 LAB — CBC
HCT: 42.7 % (ref 36.0–46.0)
Hemoglobin: 14.1 g/dL (ref 12.0–15.0)
MCHC: 33 g/dL (ref 30.0–36.0)
MCV: 83.1 fL (ref 78.0–100.0)

## 2011-06-15 LAB — DIFFERENTIAL
Basophils Absolute: 0 10*3/uL (ref 0.0–0.1)
Eosinophils Relative: 5 % (ref 0–5)
Lymphocytes Relative: 29 % (ref 12–46)
Lymphs Abs: 1.6 10*3/uL (ref 0.7–4.0)
Monocytes Absolute: 0.4 10*3/uL (ref 0.1–1.0)
Monocytes Relative: 7 % (ref 3–12)
Neutro Abs: 3.3 10*3/uL (ref 1.7–7.7)

## 2011-06-15 LAB — LIPID PANEL
HDL: 64 mg/dL (ref >39)
LDL Cholesterol: 135 mg/dL — ABNORMAL HIGH (ref 0–99)
Total CHOL/HDL Ratio: 3.3 RATIO
Triglycerides: 77 mg/dL (ref <150)

## 2011-06-15 LAB — PHOSPHORUS: Phosphorus: 3.4 mg/dL (ref 2.3–4.6)

## 2011-06-15 LAB — TSH: TSH: 0.422 u[IU]/mL (ref 0.350–4.500)

## 2011-06-15 LAB — GLUCOSE, CAPILLARY: Glucose-Capillary: 191 mg/dL — ABNORMAL HIGH (ref 70–99)

## 2011-06-15 LAB — HEMOGLOBIN A1C: Mean Plasma Glucose: 140 mg/dL — ABNORMAL HIGH (ref <117)

## 2011-06-15 NOTE — H&P (Signed)
NAMEBRIONY, [name] NO.:  [id number]  MEDICAL RECORD NO.:  [id number]  LOCATION:  5506                         FACILITY:  [facility]  PHYSICIAN:  Debbora Presto, MD DATE OF BIRTH:  Jan 08, 1946  DATE OF ADMISSION:  06/15/2011 DATE OF DISCHARGE:                             HISTORY & PHYSICAL   PRIMARY CARE PHYSICIAN:  Mina Marble, MD, with [facility].  CHIEF CONCERN:  Tongue swelling.  HISTORY OF PRESENT ILLNESS:  The patient is a 65 year old female with past medical history of hypertension, hyperlipidemia, and diabetes noninsulin dependent, recently discharged from the hospital for questionable TIAs/stroke (she was hospitalized at [facility] and was told that her symptoms have completely resolved).  She presents today to [facility] Department with main concern of tongue swelling for the past day.  She feels as if she is not able to swallow adequately, but has been eating and drinking without any drooling.  Her speech is intact and per family members the only difference they notice is swollen lip and tongue.  The patient denies shortness of breath or chest pain, no abdominal or urinary concerns, no systemic symptoms, fevers, chills, weight loss, no generalized weakness, no headache or dizziness.  ALLERGIES:  NKDA.  SOCIAL HISTORY:  Lives at home alone.  Denies drinking alcohol, tobacco, or illicit substance abuse.  FAMILY HISTORY:  CAD in father.  Diabetes in mother.  REVIEW OF SYSTEMS:  Per HPI.  PHYSICAL EXAM:  VITAL SIGNS:  Temperature 98.1, pulse 71, respirations 19, blood pressure 152/88, and saturation 96% on room air. GENERAL:  The patient is lying in bed, not in acute distress. HEENT:  Head atraumatic.  PERRLA.  EOMI.  No oropharyngeal erythema. Uvula is not swollen.  Tongue and lower lip appears to be slightly swollen, but not obstructing airway.  Airway is clearly visualized, tongue, uvula.  No sinus  tenderness or congestion. NECK:  Supple.  No thyromegaly.  Full range of motion. CARDIOVASCULAR:  Regular rate and rhythm.  S1 and S2 present.  No murmurs, rubs, or gallops. LUNGS:  Clear to auscultation bilaterally.  No wheezing rhonchi, or rales. ABDOMEN:  Soft, nontender, and nondistended.  Bowel sounds present.  No organomegaly appreciated. EXTREMITIES:  +1 bilateral pitting edema. NEUROLOGIC:  The patient is alert and oriented x3.  Motor strength 5/5 throughout bilaterally in upper and lower extremities, sensation intact bilaterally throughout, cerebellar function intact, normal finger-to- nose and heel-to-shin, DTR +2 throughout.  Plantar is downgoing.  BLOOD WORK:  Phosphorus 3.4, magnesium 2, sodium 138, potassium 3.8, chloride 100, bicarb 27, glucose 146, BUN 15, creatinine 0.73, bilirubin 0.5, alk phos 108, AST 27, ALT 26, total protein 7.8, albumin 4.5, calcium 10.2.  Cholesterol 214, triglycerides 77, HDL 64, LDL 135. Troponin x1 negative.  WBC 5.7, hemoglobin 14.1, hematocrit 42.7, and platelets 201.  Chest x-ray on June 15, 2011, cardiomegaly, no other acute cardiopulmonary findings.  CT of the head on June 14, 2011, no acute intracranial findings, no significant change from prior study, lacunar infarcts in the basal ganglia and small vessel ischemia of the white matter.  MEDICATIONS AT HOME: 1. Advair Diskus twice daily. 2. Lasix 20 mg p.o. daily. 3. Clonidine  0.1 mg tablet twice daily. 4. Aspirin. 5. Zocor. 6. Losartan/Norvasc  ASSESSMENT AND PLAN: 1. Tongue swelling -  the patient's concern is that this is possibly     secondary to TIA, new stroke, however, this is most likely     secondary to intolerance to medication, ACEs/ARBs which apparently     the patient has been taking at home.  We do not have updated     medication list since we have no records from [facility] and the patient did not bring the list with her to the     hospital.   We will obtain records from [facility] to     check for medication list specifically and for now we will avoid     all ACEs and ARBs and will put and allergy on maybe patient profile     that she is not able to take ACEs or ARBs and will put an allergy     on the patient's profile that she is not able to take ACEs or ARBs     due to angioedema.  We will monitor the patient for 24 hours and     insure that airway is clear and the patient is maintaining sats     above 90%.  We will give her Benadryl every 4 hours as needed and     we will also give her gentle hydration with normal saline IV     fluids. 2. Recent history of stroke.  This is noted on CT of the scan and     perhaps this is the same stroke that the patient was hospitalized     at [facility] and recently discharged.  We will have     to obtain records from [facility] prior to proceeding     with further workup.  She has had MRI at [facility] and has had 2-D     echo per her family members and we will therefore proceed with     obtaining records.  Please note that her neurologic exam is     entirely within normal limits with good strength and good sensory     and cerebellar function and therefore she has no residual deficits     from the stroke that she has just recently sustained.  She is     getting physical therapy at home and therefore we will not place     new physical therapy consult at this time. 3. Hypertension - we will resume home medications, but we will avoid     ACEs and ARBs. 4. Hyperlipidemia - we will continue home medication regimen and will     check TSH.  Over 30 minutes was spent on admitting the patient.     Debbora Presto, MD     IM/MEDQ  D:  06/15/2011  T:  06/15/2011  Job:  [id number]  Electronically Signed by Debbora Presto MD on 06/15/2011 04:24:18 PM

## 2011-06-16 LAB — BASIC METABOLIC PANEL
BUN: 17 mg/dL (ref 6–23)
Chloride: 101 mEq/L (ref 96–112)
GFR calc Af Amer: >90 mL/min (ref >90)
GFR calc non Af Amer: 88 mL/min — ABNORMAL LOW (ref >90)
Potassium: 3.8 mEq/L (ref 3.5–5.1)
Sodium: 140 mEq/L (ref 135–145)

## 2011-06-16 LAB — CBC
HCT: 41.8 % (ref 36.0–46.0)
RDW: 15.4 % (ref 11.5–15.5)
WBC: 9.1 10*3/uL (ref 4.0–10.5)

## 2011-06-16 LAB — GLUCOSE, CAPILLARY
Glucose-Capillary: 107 mg/dL — ABNORMAL HIGH (ref 70–99)
Glucose-Capillary: 136 mg/dL — ABNORMAL HIGH (ref 70–99)

## 2011-06-17 ENCOUNTER — Encounter: Payer: Self-pay | Admitting: Internal Medicine

## 2011-06-17 ENCOUNTER — Ambulatory Visit (INDEPENDENT_AMBULATORY_CARE_PROVIDER_SITE_OTHER): Payer: Medicare PPO | Admitting: Internal Medicine

## 2011-06-17 DIAGNOSIS — Z8679 Personal history of other diseases of the circulatory system: Secondary | ICD-10-CM

## 2011-06-17 DIAGNOSIS — T783XXA Angioneurotic edema, initial encounter: Secondary | ICD-10-CM

## 2011-06-17 DIAGNOSIS — I2510 Atherosclerotic heart disease of native coronary artery without angina pectoris: Secondary | ICD-10-CM

## 2011-06-17 DIAGNOSIS — I10 Essential (primary) hypertension: Secondary | ICD-10-CM

## 2011-06-17 NOTE — Patient Instructions (Signed)
Limit your sodium (Salt) intake    It is important that you exercise regularly, at least 20 minutes 3 to 4 times per week.  If you develop chest pain or shortness of breath seek  medical attention.  You need to lose weight.  Consider a lower calorie diet and regular exercise.  Monitor home blood sugar readings and call the office if your blood sugar is in excess of 200

## 2011-06-17 NOTE — Progress Notes (Signed)
  Subjective:    Patient ID: [name], female    DOB: March 17, 1946, 65 y.o.   MRN: [id number]  HPI  65 year old patient who is seen today for followup. She was discharged from: Hospital yesterday following an episode of angioedema. Prior to that she was admitted to [facility] for an apparent recurrent left brain stroke. She presented with left facial weakness speech difficulty and some right-sided weakness. She does have a prior history of cerebrovascular disease. Since her discharge yesterday her tongue swelling has largely resolved. She feels well today. Her discharge medications instructions were reviewed. Her blood pressure is well-controlled today. Also records were reviewed and did reveal a impaired glucose tolerance. She was discharged with a glucometer.    Review of Systems  Constitutional: Negative.   HENT: Positive for facial swelling. Negative for hearing loss, congestion, sore throat, rhinorrhea, dental problem, sinus pressure and tinnitus.   Eyes: Negative for pain, discharge and visual disturbance.  Respiratory: Negative for cough and shortness of breath.   Cardiovascular: Negative for chest pain, palpitations and leg swelling.  Gastrointestinal: Negative for nausea, vomiting, abdominal pain, diarrhea, constipation, blood in stool and abdominal distention.  Genitourinary: Negative for dysuria, urgency, frequency, hematuria, flank pain, vaginal bleeding, vaginal discharge, difficulty urinating, vaginal pain and pelvic pain.  Musculoskeletal: Negative for joint swelling, arthralgias and gait problem.  Skin: Negative for rash.  Neurological: Negative for dizziness, syncope, speech difficulty, weakness, numbness and headaches.  Hematological: Negative for adenopathy.  Psychiatric/Behavioral: Negative for behavioral problems, dysphoric mood and agitation. The patient is not nervous/anxious.        Objective:   Physical Exam  Constitutional: She is oriented to person,  place, and time. She appears well-developed and well-nourished.       120/80  HENT:  Head: Normocephalic.  Right Ear: External ear normal.  Left Ear: External ear normal.  Mouth/Throat: Oropharynx is clear and moist.  Eyes: Conjunctivae and EOM are normal. Pupils are equal, round, and reactive to light.  Neck: Normal range of motion. Neck supple. No thyromegaly present.  Cardiovascular: Normal rate, regular rhythm, normal heart sounds and intact distal pulses.   Pulmonary/Chest: Effort normal and breath sounds normal.  Abdominal: Soft. Bowel sounds are normal. She exhibits no mass. There is no tenderness.  Musculoskeletal: Normal range of motion.  Lymphadenopathy:    She has no cervical adenopathy.  Neurological: She is alert and oriented to person, place, and time.       Nonfocal  Skin: Skin is warm and dry. No rash noted.  Psychiatric: She has a normal mood and affect. Her behavior is normal.          Assessment & Plan:   Status post recent right brain stroke or TIA Hypertension well controlled Impaired glucose tolerance. We'll continue to monitor home blood pressure and blood sugar readings. Recheck in 3 months. A hemoglobin A1c will be checked at that time  Detail instructions for her medications dispensed as well as information concerning the purpose of each medication

## 2011-06-20 ENCOUNTER — Telehealth: Payer: Self-pay

## 2011-06-20 ENCOUNTER — Inpatient Hospital Stay (INDEPENDENT_AMBULATORY_CARE_PROVIDER_SITE_OTHER)
Admission: RE | Admit: 2011-06-20 | Discharge: 2011-06-20 | Disposition: A | Discharge status: Home / Self Care | Payer: Medicare PPO | Source: Ambulatory Visit | Attending: Emergency Medicine | Admitting: Emergency Medicine

## 2011-06-20 DIAGNOSIS — T7840XA Allergy, unspecified, initial encounter: Secondary | ICD-10-CM

## 2011-06-20 NOTE — Telephone Encounter (Signed)
Pt called and stated that she was in the hospital last week because she had two mini strokes. Pt states she woke up this am and her right eye was swollen shut and she has rash that are spreading all over her body.  Pt states these rash are itchy. Pt states she is taking zyrtec and she was started on some new medications. Pt denies any swelling of the throat and she has not tried any new foods lately. Pt encouraged to go to urgent care or ER.  Pt states that her son will be by to see her soon.

## 2011-06-23 NOTE — Telephone Encounter (Signed)
Dr. K notified. 

## 2011-06-24 NOTE — Discharge Summary (Signed)
[name] NO.:  [id number]  MEDICAL RECORD NO.:  [id number]  LOCATION:  5506                         FACILITY:  [facility]  PHYSICIAN:  Anita Presto, MD DATE OF BIRTH:  1945/10/31  DATE OF ADMISSION:  06/15/2011 DATE OF DISCHARGE:  06/16/2011                              DISCHARGE SUMMARY   PRIMARY CARE PHYSICIAN:  Anita Marble, MD with [facility].  DISCHARGE MEDICATIONS: 1. Advair Diskus twice daily. 2. Lasix 20 mg tablet daily. 3. Clonidine 0.1 mg tablet twice daily. 4. Aspirin 81 mg tablet by mouth daily. 5. Zocor. 6. Benadryl 25 mg tablet every 4 hours as needed for itching. 7. Vicodin 5-325 mg tablet 1-2 tablets every 4 hours as needed for     pain. 8. Norvasc 10 mg tablet by mouth daily. 9. Albuterol inhaler two puffs every 4 hours as needed 90 mcg. 10.Cetirizine 10 mg tablet by mouth daily. 11.Clonidine 0.2 mg tablet twice daily. 12.Zocor 20 mg tablet once daily. 13.Hygroton one tablet once daily. 14.Please stop taking losartan.  DISCHARGE DIAGNOSES: 1. Tongue swelling - determined to be secondary to ACE inhibitors/ARB     intolerance and allergic reaction, resolved upon discharge. 2. Recent history of stroke approximately a week ago - no residual     neurologic deficits. 3. Hypertension. 4. Hyperlipidemia.  DISPOSITION AND FOLLOWUP:  The patient was discharged from the hospital on stable condition.  Swelling of the tongue and lips has significantly improved and the patient was able to tolerate liquids and solids with no difficulties.  She will need to follow up with primary care physician in approximately 2-3 weeks.  In addition, please note that she has most likely allergic reaction to ACEs and ARBs and those should be avoided for blood pressure control.  CONSULTANT:  None.  BRIEF HPI:  The patient is a 65 year old female with past medical history of hypertension, hyperlipidemia, diabetes,  non-insulin dependent, who was recently discharged from the hospital to [facility] and was hospitalized for TIA/stroke.  She presents to [facility] emergency department with main concern of tongue swelling for past 1-2 days prior to admission.  She feels as she was not able to swallow adequately, but has been drinking without any difficulties.  She denies any difficulty with speaking, and per family members, the only difference they notice is swollen lip and tongue.  The patient denies chest pain, shortness of breath, abdominal or urinary concerns.  No systemic symptoms of fevers, chills, weight loss, generalized weakness, no headaches, or dizziness.  PHYSICAL EXAM:  VITAL SIGNS:  Upon discharge, temperature 98 Fahrenheit, blood pressure 149/75, heart rate 60 beats per minute, respirations 18 breaths per minute, oxygen saturation 94% on room air. GENERAL:  The patient is sitting in bed, not in acute distress. Cooperative to examination. CARDIOVASCULAR:  Regular rate rhythm.  S1, S2 present.  No murmurs, rubs, or gallops. LUNGS:  Clear to auscultation bilaterally.  No wheezing, no rhonchi, no rales. ABDOMEN:  Soft, nontender, nondistended.  Bowel sounds present. EXTREMITIES:  No edema.  Pulses are palpable bilaterally, +2, equal in intensity. NEUROLOGIC:  Grossly nonfocal.  BLOOD WORK:  Sodium 140, potassium 3.8, chloride 101, bicarb 29, BUN 17,  creatinine 0.76, glucose 131.  WBC 9.1, hemoglobin 13.6, platelets 208. A1c 6.5, TSH within normal limits.  UDS negative.  HOSPITAL COURSE BY PROBLEMS: 1. Tongue swelling - this was determined to be secondary to ACEs, ARBs     intolerance and therefore this will be placed on patient's allergy     list.  Alternative antihypertensive medicine should be used for the     patient blood pressure control.  Please note that the patient was     started on Norvasc during the hospitalization.  She will have to     follow up with primary  care physician for further dose adjustment     and if indicated addition of another antihypertensive.  Her tongue     swelling and lip swelling have resolved by day 2 of     hospitalization. 2. Hypertension.  This was well controlled during the hospitalization     and please note that Norvasc was added to patient's medication     regimen, and losartan was discontinued secondary to #1. 3. Diabetes.  This appears to be well controlled with A1c of 6.5.  The     patient will continue same regimen upon discharge.  Over 30 minutes was spent on discharging the patient.     Anita Presto, MD     IM/MEDQ  D:  06/24/2011  T:  06/24/2011  Job:  [id number]  cc:   [name], M.D.  Electronically Signed by Anita Presto MD on 06/24/2011 05:13:15 PM

## 2011-06-26 ENCOUNTER — Other Ambulatory Visit (HOSPITAL_COMMUNITY): Payer: Self-pay | Admitting: Internal Medicine

## 2011-07-04 ENCOUNTER — Other Ambulatory Visit (HOSPITAL_COMMUNITY): Payer: Medicare PPO

## 2011-07-04 ENCOUNTER — Ambulatory Visit (HOSPITAL_COMMUNITY): Admission: RE | Admit: 2011-07-04 | Payer: Medicare PPO | Source: Ambulatory Visit

## 2011-07-11 ENCOUNTER — Telehealth: Payer: Self-pay | Admitting: Internal Medicine

## 2011-07-11 NOTE — Telephone Encounter (Signed)
Doctor Amador Cunas has completed ppwk - has it been faxed?

## 2011-07-11 NOTE — Telephone Encounter (Signed)
Pt called asking about papers she had dropped off for the doctor to fill out. Pt requesting to be called back about status of papers. Pt requesting if she doesn't answer to leave a detailed message

## 2011-07-29 NOTE — Telephone Encounter (Signed)
Left message on patient voicemail that papers are ready to be pick up.  Did not have a fax #

## 2011-09-11 ENCOUNTER — Telehealth: Payer: Self-pay | Admitting: *Deleted

## 2011-09-11 ENCOUNTER — Ambulatory Visit: Payer: Medicare PPO | Admitting: Internal Medicine

## 2011-09-11 NOTE — Telephone Encounter (Signed)
Pt calling to request to switch PCP from [place] to [place] because [facility] office is too far out.  Please advise if ok to switch.

## 2011-09-11 NOTE — Telephone Encounter (Signed)
ok 

## 2011-09-17 NOTE — Telephone Encounter (Signed)
Pt's daughter notified.

## 2011-10-17 ENCOUNTER — Encounter (HOSPITAL_COMMUNITY): Payer: Self-pay | Admitting: *Deleted

## 2011-10-17 ENCOUNTER — Emergency Department (INDEPENDENT_AMBULATORY_CARE_PROVIDER_SITE_OTHER)
Admission: EM | Admit: 2011-10-17 | Discharge: 2011-10-17 | Disposition: A | Discharge status: Home Health | Payer: Worker's Compensation | Source: Home / Self Care | Attending: Emergency Medicine | Admitting: Emergency Medicine

## 2011-10-17 ENCOUNTER — Emergency Department (HOSPITAL_COMMUNITY): Payer: Worker's Compensation

## 2011-10-17 ENCOUNTER — Emergency Department (INDEPENDENT_AMBULATORY_CARE_PROVIDER_SITE_OTHER): Payer: Worker's Compensation

## 2011-10-17 DIAGNOSIS — S6000XA Contusion of unspecified finger without damage to nail, initial encounter: Secondary | ICD-10-CM

## 2011-10-17 HISTORY — DX: Other allergy status, other than to drugs and biological substances: Z91.09

## 2011-10-17 HISTORY — DX: Hyperlipidemia, unspecified: E78.5

## 2011-10-17 MED ORDER — TRAMADOL HCL 50 MG PO TABS: 100.0000 mg | ORAL_TABLET | Freq: Three times a day (TID) | ORAL | Status: AC | PRN

## 2011-10-17 NOTE — ED Notes (Signed)
Pt reports getting her right 4th finger "mashed in a machine" at work at approx 1400 today.  C/O continued throbbing pain.  Has had finger splint in place.  Distal aspect of finger ecchymotic and slightly swollen.  Has taken ASA for pain.

## 2011-10-17 NOTE — ED Provider Notes (Signed)
Chief Complaint  Patient presents with  . 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 She crushed the tip of the finger. It's painful to touch, swollen, it hurts to move it. She denies any numbness or tingling. Remainder of the hand was normal.  Review of Systems:  Other than noted above, the patient denies any of the following symptoms: Systemic:  No fevers, chills, sweats, or aches.  No fatigue or tiredness. Musculoskeletal:  No joint pain, arthritis, bursitis, swelling, back pain, or neck pain. Neurological:  No muscular weakness, paresthesias, headache, or trouble with speech or coordination.  No dizziness.   PMFSH:  Past medical history, family history, social history, meds, and allergies were reviewed.  Physical Exam:   Vital signs:  BP 204/65  Pulse 62  Temp(Src) 98.4 F (36.9 C) (Oral)  Resp 18  SpO2 98% Gen:  Alert and oriented times 3.  In no distress. Musculoskeletal: Exam of the tip of the finger reveals swelling, tenderness to touch, and slight bruising underneath the nail. She is able to move the DIP joint. The PIP joint moves normally. Otherwise, all joints had a full a ROM with no swelling, bruising or deformity.  No edema, pulses full. Extremities were warm and pink.  Capillary refill was brisk.  Skin:  Clear, warm and dry.  No rash. Neuro:  Alert and oriented times 3.  Muscle strength was normal.  Sensation was intact to light touch.   Radiology:  Dg Finger Ring Right  10/17/2011  *RADIOLOGY REPORT*  Clinical Data: Crush injury to the right ring finger.  RIGHT RING FINGER 2+V 10/17/2011:  Comparison: None.  Findings: No evidence of acute fracture or dislocation.  Joint space narrowing involving the PIP joint and the DIP joint.  MCP joint well preserved.  Bone mineral density well preserved.  IMPRESSION: No acute osseous abnormality.  Osteoarthritis involving the IP joints.   Original Report Authenticated By: Arnell Sieving, M.D.    Assessment:   Diagnoses that have been ruled out:  None  Diagnoses that are still under consideration:  None  Final diagnoses:  Contusion of finger    Plan:   1.  The following meds were prescribed:   New Prescriptions   TRAMADOL (ULTRAM) 50 MG TABLET    Take 2 tablets (100 mg total) by mouth every 8 (eight) hours as needed for pain.   2.  The patient was instructed in symptomatic care, including rest and activity, elevation, application of ice and compression.  Appropriate handouts were given. 3.  The patient was told to return if becoming worse in any way, if no better in 3 or 4 days, and given some red flag symptoms that would indicate earlier return.   4.  The patient was told to follow up with occupational medicine in 5 days. In the meantime she was placed on limited activity at work. The finger was splinted in the meantime.  Roque Lias, MD 10/17/11 [phone]

## 2011-10-17 NOTE — Discharge Instructions (Signed)
Contusion A contusion is a deep bruise. Bruises happen when an injury causes bleeding under the skin. Signs of bruising include pain, puffiness (swelling), and discolored skin. The bruise may turn blue, purple, or yellow. HOME CARE   Rest the injured area until the pain and puffiness are better.   Try to limit use of the injured area as much as possible or as told by your doctor.   Put ice on the injured area.   Put ice in a plastic bag.   Place a towel between your skin and the bag.   Leave the ice on for 15 to 20 minutes, 3 to 4 times a day.   Raise (elevate) the injured area above the level of the heart.   Use an elastic bandage to lessen puffiness and motion.   Only take medicine as told by your doctor.   Eat healthy.   See your doctor for a follow-up visit.  GET HELP RIGHT AWAY IF:   There is more redness, puffiness, or pain.   You have a headache, muscle ache, or you feel dizzy and ill.   You have a fever.   The pain is not controlled with medicine.   The bruise is not getting better.   There is yellowish white fluid (pus) coming from the wound.   You lose feeling (numbness) in the injured area.   The bruised area feels cold.   There are new problems.  MAKE SURE YOU:   Understand these instructions.   Will watch your condition.   Will get help right away if you are not doing well or get worse.  Document Released: 01/28/2008 Document Revised: 04/23/2011 Document Reviewed: 01/28/2008 [employer] Patient Information 2012 [employer]. 

## 2011-10-17 NOTE — ED Notes (Signed)
Pt states she has not been taking most of her meds due to side effects and being in between switching PCPs

## 2011-10-20 ENCOUNTER — Telehealth (HOSPITAL_COMMUNITY): Payer: Self-pay | Admitting: *Deleted

## 2011-10-20 ENCOUNTER — Ambulatory Visit (INDEPENDENT_AMBULATORY_CARE_PROVIDER_SITE_OTHER): Payer: Medicare PPO | Admitting: Family

## 2011-10-20 ENCOUNTER — Encounter: Payer: Self-pay | Admitting: Family

## 2011-10-20 VITALS — BP 208/98 | HR 75 | Wt 216.0 lb

## 2011-10-20 DIAGNOSIS — I10 Essential (primary) hypertension: Secondary | ICD-10-CM

## 2011-10-20 MED ORDER — ALISKIREN FUMARATE 150 MG PO TABS: 150.0000 mg | ORAL_TABLET | Freq: Every day | ORAL | Status: AC

## 2011-10-20 NOTE — Patient Instructions (Signed)

## 2011-10-20 NOTE — Progress Notes (Signed)
Subjective:    Patient ID: [name], female    DOB: 1946/01/07, 66 y.o.   MRN: [id number]  HPI 66 y/o female AAF, patient of Dr. Kirtland Bouchard is in today with uncontrolled hypertension, due to medical noncompliance. She is supposed to be on blood pressure meds but has not been for several months. She reports the medications make her sick. She is unsure of which medication caused her to feel bad. She has a past medical history of strokes most recently in November 2012. She denies any lightheadedness, dizziness, chest pain, palpitations, shortness of breath or edema.   Review of Systems  Constitutional: Negative.   HENT: Negative.   Respiratory: Negative.   Cardiovascular: Negative.   Genitourinary: Negative.   Musculoskeletal: Negative.   Skin: Negative.   Neurological: Negative.   Hematological: Negative.   Psychiatric/Behavioral: Negative.    Past Medical History  Diagnosis Date  . 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 ASTHMA 04/16/2007  . 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 CORONARY ARTERY DISEASE 08/29/2008  . HYPERTENSION 04/16/2007  . URI 10/25/2009  . Hyperlipidemia   . Environmental allergies     History   Social History  . Marital Status: Widowed    Spouse Name: N/A    Number of Children: N/A  . Years of Education: N/A   Occupational History  . Not on file.   Social History Main Topics  . Smoking status: Never Smoker   . Smokeless tobacco: Never Used  . Alcohol Use: No  . Drug Use: No  . Sexually Active: Not on file   Other Topics Concern  . Not on file   Social History Narrative  . No narrative on file    Past Surgical History  Procedure Date  . Cardiac catheterization     No family history on file.  No Known Allergies  Current Outpatient Prescriptions on File Prior to Visit  Medication Sig Dispense Refill  . albuterol (PROAIR HFA) 108 (90 BASE) MCG/ACT inhaler Inhale 1 puff into the lungs every 6 (six) hours as needed for wheezing.  1 Inhaler  6  .  aspirin 81 MG tablet Take 81 mg by mouth daily.        . cetirizine (ZYRTEC) 10 MG tablet Take 10 mg by mouth daily.        . diphenhydrAMINE (BENADRYL) 25 MG tablet Take 25 mg by mouth every 6 (six) hours as needed.        . Fluticasone-Salmeterol (ADVAIR DISKUS) 500-50 MCG/DOSE AEPB Inhale 1 puff into the lungs every 12 (twelve) hours.  60 each  6  . amLODipine (NORVASC) 10 MG tablet Take 1 tablet (10 mg total) by mouth daily.  90 tablet  3  . chlorthalidone (HYGROTON) 25 MG tablet Take 1 tablet (25 mg total) by mouth daily.  90 tablet  6  . cloNIDine (CATAPRES) 0.2 MG tablet Take 1 tablet (0.2 mg total) by mouth 2 (two) times daily.  180 tablet  6  . clopidogrel (PLAVIX) 75 MG tablet Take 75 mg by mouth daily.        Marland Kitchen HYDROcodone-acetaminophen (NORCO) 5-325 MG per tablet Take 1 tablet by mouth every 4 (four) hours as needed.        . simvastatin (ZOCOR) 20 MG tablet Take 1 tablet (20 mg total) by mouth at bedtime.  90 tablet  6  . traMADol (ULTRAM) 50 MG tablet Take 2 tablets (100 mg total) by mouth every 8 (eight) hours as needed for pain.  30 tablet  0    BP 208/98  Pulse 75  Wt 216 lb (97.977 kg)  SpO2 97%chart  Objective:   Physical Exam  Constitutional: She is oriented to person, place, and time. She appears well-developed and well-nourished.  HENT:  Right Ear: External ear normal.  Left Ear: External ear normal.  Nose: Nose normal.  Mouth/Throat: Oropharynx is clear and moist.  Neck: Normal range of motion. Neck supple.  Cardiovascular: Normal rate, regular rhythm and normal heart sounds.   Pulmonary/Chest: Effort normal and breath sounds normal.  Musculoskeletal: Normal range of motion.  Neurological: She is alert and oriented to person, place, and time.  Skin: Skin is warm and dry.  Psychiatric: She has a normal mood and affect.    Clonidine 0.1 mg given by mouth x1. Recheck blood pressure 180/100      Assessment & Plan:  Assessment:  Hypertension-Uncontrolled  Plan: Because of her past medical history of angioedema we will refrain from prescribing an ACE or an ARB. Patient reports having swelling with Norvasc. Therefore we will try Tekturna 150 mg once daily in hopes of minimal side effects. Bring her back for recheck in 2 weeks. Spoke patient at length, belt uncontrolled hypertension and her risk of stroke, heart attack or death. Patient understands this risk.

## 2011-10-20 NOTE — ED Notes (Signed)
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 I accessed chart and DC instructions said to f/u at [facility] in 5 days. I called [name] back and told her that Occupational Health can lift the restriction. Vassie Moselle 10/20/2011

## 2011-10-29 ENCOUNTER — Encounter: Payer: Self-pay | Admitting: Family

## 2011-10-29 ENCOUNTER — Ambulatory Visit (INDEPENDENT_AMBULATORY_CARE_PROVIDER_SITE_OTHER): Payer: Medicare PPO | Admitting: Family

## 2011-10-29 VITALS — BP 188/98 | HR 76 | Temp 98.4°F | Wt 218.0 lb

## 2011-10-29 DIAGNOSIS — I10 Essential (primary) hypertension: Secondary | ICD-10-CM

## 2011-10-29 DIAGNOSIS — R110 Nausea: Secondary | ICD-10-CM

## 2011-10-29 LAB — CBC
Platelets: 162 10*3/uL (ref 150.0–400.0)
RBC: 4.76 Mil/uL (ref 3.87–5.11)
WBC: 5.4 10*3/uL (ref 4.5–10.5)

## 2011-10-29 LAB — TSH: TSH: 0.39 u[IU]/mL (ref 0.35–5.50)

## 2011-10-29 LAB — BASIC METABOLIC PANEL
Calcium: 9.6 mg/dL (ref 8.4–10.5)
GFR: 97.19 mL/min (ref >60.00)
Glucose, Bld: 88 mg/dL (ref 70–99)
Potassium: 4.1 mEq/L (ref 3.5–5.1)
Sodium: 142 mEq/L (ref 135–145)

## 2011-10-29 MED ORDER — LOSARTAN POTASSIUM-HCTZ 100-25 MG PO TABS: 1.0000 | ORAL_TABLET | Freq: Every day | ORAL | Status: DC

## 2011-10-29 NOTE — Progress Notes (Signed)
Subjective:    Patient ID: [name], female    DOB: [birth year], 66 y.o.   MRN: [id number]  HPI Comments: C/o fatigue, intermittent lightheadedness and "spots before eyes", constipation, and nausea. Had loose stools yesterday with no noted blood, legally blind. Is not taking prescribed meds expect advar, zyrtec, asa, and tekturna for blood pressure. Denies cp or shortness of breath.Stating since last visit starting tekturna has facial swelling.   Hypertension Pertinent negatives include no headaches.      Review of Systems  Constitutional: Negative.   HENT: Negative.   Eyes: Negative.   Respiratory: Negative.   Cardiovascular: Negative.   Gastrointestinal: Positive for constipation.  Genitourinary: Negative.   Neurological: Positive for dizziness. Negative for tremors, syncope, facial asymmetry, speech difficulty, weakness, light-headedness, numbness and headaches.   Past Medical History  Diagnosis Date  . 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 ASTHMA 04/16/2007  . 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 CORONARY ARTERY DISEASE 08/29/2008  . HYPERTENSION 04/16/2007  . URI 10/25/2009  . Hyperlipidemia   . Environmental allergies     History   Social History  . Marital Status: Widowed    Spouse Name: N/A    Number of Children: N/A  . Years of Education: N/A   Occupational History  . Not on file.   Social History Main Topics  . Smoking status: Never Smoker   . Smokeless tobacco: Never Used  . Alcohol Use: No  . Drug Use: No  . Sexually Active: Not on file   Other Topics Concern  . Not on file   Social History Narrative  . No narrative on file    Past Surgical History  Procedure Date  . Cardiac catheterization     No family history on file.  No Known Allergies  Current Outpatient Prescriptions on File Prior to Visit  Medication Sig Dispense Refill  . albuterol (PROAIR HFA) 108 (90 BASE) MCG/ACT inhaler Inhale 1 puff into the lungs every 6 (six) hours as  needed for wheezing.  1 Inhaler  6  . amLODipine (NORVASC) 10 MG tablet Take 1 tablet (10 mg total) by mouth daily.  90 tablet  3  . aspirin 81 MG tablet Take 81 mg by mouth daily.        . cetirizine (ZYRTEC) 10 MG tablet Take 10 mg by mouth daily.        . chlorthalidone (HYGROTON) 25 MG tablet Take 1 tablet (25 mg total) by mouth daily.  90 tablet  6  . cloNIDine (CATAPRES) 0.2 MG tablet Take 1 tablet (0.2 mg total) by mouth 2 (two) times daily.  180 tablet  6  . clopidogrel (PLAVIX) 75 MG tablet Take 75 mg by mouth daily.        . diphenhydrAMINE (BENADRYL) 25 MG tablet Take 25 mg by mouth every 6 (six) hours as needed.        . Fluticasone-Salmeterol (ADVAIR DISKUS) 500-50 MCG/DOSE AEPB Inhale 1 puff into the lungs every 12 (twelve) hours.  60 each  6  . HYDROcodone-acetaminophen (NORCO) 5-325 MG per tablet Take 1 tablet by mouth every 4 (four) hours as needed.        . simvastatin (ZOCOR) 20 MG tablet Take 1 tablet (20 mg total) by mouth at bedtime.  90 tablet  6    BP 188/98  Pulse 76  Temp(Src) 98.4 F (36.9 C) (Oral)  Wt 218 lb (98.884 kg)  SpO2 97%chart     Objective:   Physical Exam  Constitutional: She is oriented to person, place, and time. She appears well-developed and well-nourished. No distress.  HENT:  Right Ear: External ear normal.  Left Ear: External ear normal.  Nose: Nose normal.  Mouth/Throat: Oropharynx is clear and moist. No oropharyngeal exudate.  Eyes: Right eye exhibits no discharge. Left eye exhibits no discharge. No scleral icterus.  Cardiovascular: Normal rate, regular rhythm, normal heart sounds and intact distal pulses.  Exam reveals no gallop and no friction rub.   No murmur heard. Pulmonary/Chest: Effort normal and breath sounds normal. No respiratory distress. She has no wheezes. She has no rales. She exhibits no tenderness.  Abdominal: Soft. Bowel sounds are normal. She exhibits no distension. There is no tenderness. There is no rebound and no  guarding.  Neurological: She is alert and oriented to person, place, and time.  Skin: Skin is warm and dry. She is not diaphoretic.    EKG: WNL, slight bradycardia      Assessment & Plan:  Assessment: Constipation, diarrhea, nausea, Hypertension-uncontrolled  Plan: encouraged to report to ED if experience any chest pain or dyspnea, labs:CBC, BMP, TSH and, BMP.  Discontinue tekturna and start hyzaar. RTC 10 days for re-check. (Tekturna was initiated because she has had Andioedema r/t ACE inhibitors). Bradycardic on EKG beta blockers not an option. Also had swelling with CCB.

## 2011-11-03 ENCOUNTER — Other Ambulatory Visit: Payer: Self-pay

## 2011-11-03 MED ORDER — LOSARTAN POTASSIUM-HCTZ 100-25 MG PO TABS: 1.0000 | ORAL_TABLET | Freq: Every day | ORAL | Status: DC

## 2011-11-03 NOTE — Telephone Encounter (Signed)
Pt requesting paper Rx because pharmacy said they never received it

## 2011-11-04 DIAGNOSIS — Z0279 Encounter for issue of other medical certificate: Secondary | ICD-10-CM

## 2011-11-14 ENCOUNTER — Encounter: Payer: Self-pay | Admitting: Internal Medicine

## 2011-11-14 ENCOUNTER — Ambulatory Visit (INDEPENDENT_AMBULATORY_CARE_PROVIDER_SITE_OTHER): Payer: Medicare PPO | Admitting: Internal Medicine

## 2011-11-14 VITALS — BP 214/80 | HR 69 | Temp 98.2°F | Resp 16 | Wt 210.0 lb

## 2011-11-14 DIAGNOSIS — I10 Essential (primary) hypertension: Secondary | ICD-10-CM

## 2011-11-14 DIAGNOSIS — T783XXA Angioneurotic edema, initial encounter: Secondary | ICD-10-CM

## 2011-11-14 DIAGNOSIS — I679 Cerebrovascular disease, unspecified: Secondary | ICD-10-CM

## 2011-11-14 MED ORDER — AMLODIPINE BESYLATE 10 MG PO TABS: 10.0000 mg | ORAL_TABLET | Freq: Every day | ORAL | Status: DC

## 2011-11-14 MED ORDER — CHLORTHALIDONE 25 MG PO TABS: 25.0000 mg | ORAL_TABLET | Freq: Every day | ORAL | Status: DC

## 2011-11-14 MED ORDER — CLONIDINE HCL 0.2 MG PO TABS: 0.2000 mg | ORAL_TABLET | Freq: Two times a day (BID) | ORAL | Status: DC

## 2011-11-14 NOTE — Progress Notes (Signed)
  Subjective:    Patient ID: [name], female    DOB: [birth year], 66 y.o.   MRN: [id number]  HPI  is a 66 year old patient who has a history of multi-drug-resistant hypertension. She also has a history of angioedema and is quite reluctant to take a blood pressure medications due to  fear of side effects.  She does have a history of angioedema presumably related to ACE inhibition.  More recently she was placed on a renin  blocker which she failed to take and apparently has not taking Hyzaar. It seems doubtful after a question that she is taking any blood pressure medication. She has been on amlodipine chlorthalidone and Catapres in the past    Review of Systems  Constitutional: Negative.   HENT: Negative for hearing loss, congestion, sore throat, rhinorrhea, dental problem, sinus pressure and tinnitus.   Eyes: Negative for pain, discharge and visual disturbance.  Respiratory: Negative for cough and shortness of breath.   Cardiovascular: Negative for chest pain, palpitations and leg swelling.  Gastrointestinal: Negative for nausea, vomiting, abdominal pain, diarrhea, constipation, blood in stool and abdominal distention.  Genitourinary: Negative for dysuria, urgency, frequency, hematuria, flank pain, vaginal bleeding, vaginal discharge, difficulty urinating, vaginal pain and pelvic pain.  Musculoskeletal: Negative for joint swelling, arthralgias and gait problem.  Skin: Negative for rash.  Neurological: Negative for dizziness, syncope, speech difficulty, weakness, numbness and headaches.  Hematological: Negative for adenopathy.  Psychiatric/Behavioral: Negative for behavioral problems, dysphoric mood and agitation. The patient is not nervous/anxious.        Objective:   Physical Exam  Constitutional: She is oriented to person, place, and time. She appears well-developed and well-nourished.       Overweight. No distress. Blood pressure 200/84  HENT:  Head: Normocephalic.  Right  Ear: External ear normal.  Left Ear: External ear normal.  Mouth/Throat: Oropharynx is clear and moist.  Eyes: Conjunctivae and EOM are normal. Pupils are equal, round, and reactive to light.  Neck: Normal range of motion. Neck supple. No thyromegaly present.  Cardiovascular: Normal rate, regular rhythm and intact distal pulses.   Murmur heard. Pulmonary/Chest: Effort normal and breath sounds normal.  Abdominal: Soft. Bowel sounds are normal. She exhibits no mass. There is no tenderness.  Musculoskeletal: Normal range of motion.  Lymphadenopathy:    She has no cervical adenopathy.  Neurological: She is alert and oriented to person, place, and time.  Skin: Skin is warm and dry. No rash noted.  Psychiatric: She has a normal mood and affect. Her behavior is normal.          Assessment & Plan:   Hypertension poor control secondary to noncompliance. We'll resume chlorthalidone Norvasc and Catapres at bedtime. We'll recheck in one week compliance stressed low-salt diet stressed Cerebrovascular disease History of angioedema

## 2011-11-14 NOTE — Patient Instructions (Signed)
Limit your sodium (Salt) intake  Take blood pressure  Medications as prescribed  Return in one week for followup

## 2011-11-25 ENCOUNTER — Encounter: Payer: Self-pay | Admitting: Internal Medicine

## 2011-11-25 ENCOUNTER — Ambulatory Visit (INDEPENDENT_AMBULATORY_CARE_PROVIDER_SITE_OTHER): Payer: Medicare PPO | Admitting: Internal Medicine

## 2011-11-25 VITALS — BP 130/80 | Temp 98.3°F | Wt 211.0 lb

## 2011-11-25 DIAGNOSIS — I2510 Atherosclerotic heart disease of native coronary artery without angina pectoris: Secondary | ICD-10-CM

## 2011-11-25 DIAGNOSIS — T783XXA Angioneurotic edema, initial encounter: Secondary | ICD-10-CM

## 2011-11-25 MED ORDER — FLUTICASONE-SALMETEROL 500-50 MCG/DOSE IN AEPB: 1.0000 | INHALATION_SPRAY | Freq: Two times a day (BID) | RESPIRATORY_TRACT | Status: DC

## 2011-11-25 MED ORDER — CETIRIZINE HCL 10 MG PO TABS: 10.0000 mg | ORAL_TABLET | Freq: Every day | ORAL | Status: DC

## 2011-11-25 MED ORDER — SIMVASTATIN 20 MG PO TABS: 20.0000 mg | ORAL_TABLET | Freq: Every day | ORAL | Status: DC

## 2011-11-25 MED ORDER — CLOPIDOGREL BISULFATE 75 MG PO TABS: 75.0000 mg | ORAL_TABLET | Freq: Every day | ORAL | Status: DC

## 2011-11-25 NOTE — Progress Notes (Signed)
  Subjective:    Patient ID: [name], female    DOB: 27-Apr-1946, 66 y.o.   MRN: [id number]  HPI  66 year old patient who is in today for followup. She was resumed on triple blood pressure medication one week ago and has done well. She has coronary artery disease and cerebrovascular disease. She has been taking amlodipine, chlorthalidone  and Catapres twice daily.    Review of Systems  Constitutional: Negative.   HENT: Negative for hearing loss, congestion, sore throat, rhinorrhea, dental problem, sinus pressure and tinnitus.   Eyes: Negative for pain, discharge and visual disturbance.  Respiratory: Negative for cough and shortness of breath.   Cardiovascular: Negative for chest pain, palpitations and leg swelling.  Gastrointestinal: Negative for nausea, vomiting, abdominal pain, diarrhea, constipation, blood in stool and abdominal distention.  Genitourinary: Negative for dysuria, urgency, frequency, hematuria, flank pain, vaginal bleeding, vaginal discharge, difficulty urinating, vaginal pain and pelvic pain.  Musculoskeletal: Negative for joint swelling, arthralgias and gait problem.  Skin: Negative for rash.  Neurological: Negative for dizziness, syncope, speech difficulty, weakness, numbness and headaches.  Hematological: Negative for adenopathy.  Psychiatric/Behavioral: Negative for behavioral problems, dysphoric mood and agitation. The patient is not nervous/anxious.        Objective:   Physical Exam  Constitutional: She appears well-developed and well-nourished. No distress.       Overweight Blood pressure 140/70 both arms  Cardiovascular:  Murmur heard.      Grade 2/6 systolic murmur at the base          Assessment & Plan:  Hypertension improved. We'll continue present regimen Coronary artery disease. The patient has not been taking simvastatin or Plavix compliance was stressed  Recheck 3 months

## 2011-11-25 NOTE — Patient Instructions (Signed)
Limit your sodium (Salt) intake    It is important that you exercise regularly, at least 20 minutes 3 to 4 times per week.  If you develop chest pain or shortness of breath seek  medical attention.  Please check your blood pressure on a regular basis.  If it is consistently greater than 150/90, please make an office appointment.  Return in 3 months for follow-up   

## 2011-12-18 ENCOUNTER — Ambulatory Visit: Payer: Self-pay | Admitting: Internal Medicine

## 2012-02-11 ENCOUNTER — Ambulatory Visit (INDEPENDENT_AMBULATORY_CARE_PROVIDER_SITE_OTHER): Payer: Medicare PPO | Admitting: Internal Medicine

## 2012-02-11 ENCOUNTER — Encounter: Payer: Self-pay | Admitting: Internal Medicine

## 2012-02-11 VITALS — BP 150/80 | Temp 98.0°F | Wt 203.0 lb

## 2012-02-11 DIAGNOSIS — I2510 Atherosclerotic heart disease of native coronary artery without angina pectoris: Secondary | ICD-10-CM

## 2012-02-11 DIAGNOSIS — Z8679 Personal history of other diseases of the circulatory system: Secondary | ICD-10-CM

## 2012-02-11 DIAGNOSIS — I10 Essential (primary) hypertension: Secondary | ICD-10-CM

## 2012-02-11 NOTE — Progress Notes (Signed)
  Subjective:    Patient ID: [name], female    DOB: 27-Mar-1946, 66 y.o.   MRN: [id number]  HPI  66 year old patient who is seen today for followup of her hypertension. She also has coronary artery disease and cerebral vascular disease. Last week she had a dental appointment for a tooth extraction. Blood pressure is noted to be quite elevated and the procedure was canceled. The patient states that she failed to take her blood pressure medication on the day of the dental appointment. Denies any exertional angina. Denies any focal neurological complaints. She remains on simvastatin for dyslipidemia. Gen. dentistry is requesting a letter clearing her for the anticipated procedure    Review of Systems  Respiratory: Negative for shortness of breath and wheezing.   Cardiovascular: Negative for chest pain.       Objective:   Physical Exam  Constitutional: She is oriented to person, place, and time. She appears well-developed and well-nourished.       Blood pressure 150/80 on arrival  Blood pressure 110/70 left arm 130/70 right arm  HENT:  Head: Normocephalic.  Right Ear: External ear normal.  Left Ear: External ear normal.  Mouth/Throat: Oropharynx is clear and moist.  Eyes: Conjunctivae and EOM are normal.       Disconjugate gaze  Neck: Normal range of motion. Neck supple. No thyromegaly present.  Cardiovascular: Normal rate, regular rhythm, normal heart sounds and intact distal pulses.   Pulmonary/Chest: Effort normal and breath sounds normal.  Abdominal: Soft. Bowel sounds are normal. She exhibits no mass. There is no tenderness.  Musculoskeletal: Normal range of motion.  Lymphadenopathy:    She has no cervical adenopathy.  Neurological: She is alert and oriented to person, place, and time.  Skin: Skin is warm and dry. No rash noted.  Psychiatric: She has a normal mood and affect. Her behavior is normal.          Assessment & Plan:   Hypertension. Compliance with  the medication discussed. She will take her blood pressure medications on schedule the morning of her dental procedure A letter on her behalf provided to her dentist  Coronary artery disease stable Cerebral vascular disease stable

## 2012-02-11 NOTE — Patient Instructions (Signed)
Limit your sodium (Salt) intake  Take all blood pressure medications the morning of your dental appointment  Return in 3 months for follow-up  Please check your blood pressure on a regular basis.  If it is consistently greater than 150/90, please make an office appointment.

## 2012-02-24 ENCOUNTER — Ambulatory Visit: Payer: Self-pay | Admitting: Internal Medicine

## 2012-03-08 ENCOUNTER — Encounter: Payer: Self-pay | Admitting: Internal Medicine

## 2012-03-08 ENCOUNTER — Ambulatory Visit (INDEPENDENT_AMBULATORY_CARE_PROVIDER_SITE_OTHER): Payer: Medicare PPO | Admitting: Internal Medicine

## 2012-03-08 VITALS — BP 120/82 | Temp 98.2°F | Wt 208.0 lb

## 2012-03-08 DIAGNOSIS — I2510 Atherosclerotic heart disease of native coronary artery without angina pectoris: Secondary | ICD-10-CM

## 2012-03-08 DIAGNOSIS — I10 Essential (primary) hypertension: Secondary | ICD-10-CM

## 2012-03-08 MED ORDER — METOPROLOL TARTRATE 50 MG PO TABS: 50.0000 mg | ORAL_TABLET | Freq: Two times a day (BID) | ORAL | Status: DC

## 2012-03-08 NOTE — Progress Notes (Signed)
  Subjective:    Patient ID: [name], female    DOB: 1946-06-01, 66 y.o.   MRN: [id number]  HPI  66 year old patient who is seen today for followup of hypertension. She was seen one month ago and a dental procedure was discontinued due to accelerated hypertension. She had failed to take her medication on that particular day. In the office her blood pressure was well-controlled and her medication was unchanged. Today she again has not taken her medication and blood pressure was as high as 220/120. She feels well    Review of Systems  Constitutional: Negative.   HENT: Negative for hearing loss, congestion, sore throat, rhinorrhea, dental problem, sinus pressure and tinnitus.   Eyes: Negative for pain, discharge and visual disturbance.  Respiratory: Negative for cough and shortness of breath.   Cardiovascular: Negative for chest pain, palpitations and leg swelling.  Gastrointestinal: Negative for nausea, vomiting, abdominal pain, diarrhea, constipation, blood in stool and abdominal distention.  Genitourinary: Negative for dysuria, urgency, frequency, hematuria, flank pain, vaginal bleeding, vaginal discharge, difficulty urinating, vaginal pain and pelvic pain.  Musculoskeletal: Negative for joint swelling, arthralgias and gait problem.  Skin: Negative for rash.  Neurological: Negative for dizziness, syncope, speech difficulty, weakness, numbness and headaches.  Hematological: Negative for adenopathy.  Psychiatric/Behavioral: Negative for behavioral problems, dysphoric mood and agitation. The patient is not nervous/anxious.        Objective:   Physical Exam  Constitutional: She is oriented to person, place, and time. She appears well-developed and well-nourished.       Blood pressure 220/120  HENT:  Head: Normocephalic.  Right Ear: External ear normal.  Left Ear: External ear normal.  Mouth/Throat: Oropharynx is clear and moist.  Eyes: Conjunctivae and EOM are normal. Pupils  are equal, round, and reactive to light.  Neck: Normal range of motion. Neck supple. No thyromegaly present.  Cardiovascular: Normal rate, regular rhythm, normal heart sounds and intact distal pulses.   Pulmonary/Chest: Effort normal and breath sounds normal.  Abdominal: Soft. Bowel sounds are normal. She exhibits no mass. There is no tenderness.  Musculoskeletal: Normal range of motion.  Lymphadenopathy:    She has no cervical adenopathy.  Neurological: She is alert and oriented to person, place, and time.  Skin: Skin is warm and dry. No rash noted.  Psychiatric: She has a normal mood and affect. Her behavior is normal.          Assessment & Plan:   Hypertension poor control. Compliance stressed we'll add metoprolol 50 mg twice a day. She does have a history of asthma but this has been stable off maintenance drugs. Was given samples of albuterol History of asthma stable Coronary artery disease History of angioedema. Will avoid ACE/ARB at this time   Recheck 2 weeks

## 2012-03-08 NOTE — Patient Instructions (Signed)
Limit your sodium (Salt) intake  Please check your blood pressure on a regular basis.  If it is consistently greater than 150/90, please make an office appointment.  Recheck 2 weeks 

## 2012-03-22 ENCOUNTER — Ambulatory Visit: Payer: Self-pay | Admitting: Internal Medicine

## 2012-03-26 ENCOUNTER — Encounter: Payer: Self-pay | Admitting: Internal Medicine

## 2012-03-26 ENCOUNTER — Ambulatory Visit (INDEPENDENT_AMBULATORY_CARE_PROVIDER_SITE_OTHER): Payer: Medicare PPO | Admitting: Internal Medicine

## 2012-03-26 VITALS — BP 150/88 | Wt 208.0 lb

## 2012-03-26 DIAGNOSIS — J45909 Unspecified asthma, uncomplicated: Secondary | ICD-10-CM

## 2012-03-26 DIAGNOSIS — I10 Essential (primary) hypertension: Secondary | ICD-10-CM

## 2012-03-26 DIAGNOSIS — I2510 Atherosclerotic heart disease of native coronary artery without angina pectoris: Secondary | ICD-10-CM

## 2012-03-26 NOTE — Progress Notes (Signed)
Subjective:    Patient ID: [name], female    DOB: 18-Jul-1946, 66 y.o.   MRN: [id number]  HPI  66 year old patient who was seen 2 weeks ago with accelerated hypertension after noncompliance with her medications. Metoprolol 50 mg was added to her regimen which she has tolerated well. She does have a history of asthma as well as coronary artery disease. She feels quite well today and has been compliant with her medications.  Past Medical History  Diagnosis Date  . 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 ASTHMA 04/16/2007  . 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 CORONARY ARTERY DISEASE 08/29/2008  . HYPERTENSION 04/16/2007  . URI 10/25/2009  . Hyperlipidemia   . Environmental allergies     History   Social History  . Marital Status: Widowed    Spouse Name: N/A    Number of Children: N/A  . Years of Education: N/A   Occupational History  . Not on file.   Social History Main Topics  . Smoking status: Never Smoker   . Smokeless tobacco: Never Used  . Alcohol Use: No  . Drug Use: No  . Sexually Active: Not on file   Other Topics Concern  . Not on file   Social History Narrative  . No narrative on file    Past Surgical History  Procedure Date  . Cardiac catheterization     No family history on file.  No Known Allergies  Current Outpatient Prescriptions on File Prior to Visit  Medication Sig Dispense Refill  . albuterol (PROAIR HFA) 108 (90 BASE) MCG/ACT inhaler Inhale 1 puff into the lungs every 6 (six) hours as needed for wheezing.  1 Inhaler  6  . amLODipine (NORVASC) 10 MG tablet Take 1 tablet (10 mg total) by mouth daily.  90 tablet  3  . aspirin 81 MG tablet Take 81 mg by mouth daily.        . cetirizine (ZYRTEC) 10 MG tablet Take 1 tablet (10 mg total) by mouth daily.  90 tablet  5  . chlorthalidone (HYGROTON) 25 MG tablet Take 1 tablet (25 mg total) by mouth daily.  90 tablet  6  . cloNIDine (CATAPRES) 0.2 MG tablet Take 1 tablet (0.2 mg total) by mouth 2 (two)  times daily.  180 tablet  6  . clopidogrel (PLAVIX) 75 MG tablet Take 1 tablet (75 mg total) by mouth daily.  90 tablet  6  . Fluticasone-Salmeterol (ADVAIR DISKUS) 500-50 MCG/DOSE AEPB Inhale 1 puff into the lungs every 12 (twelve) hours.  60 each  6  . metoprolol (LOPRESSOR) 50 MG tablet Take 1 tablet (50 mg total) by mouth 2 (two) times daily.  180 tablet  3  . simvastatin (ZOCOR) 20 MG tablet Take 1 tablet (20 mg total) by mouth at bedtime.  90 tablet  6  . DISCONTD: losartan-hydrochlorothiazide (HYZAAR) 100-25 MG per tablet Take 1 tablet by mouth daily.  30 tablet  3    BP 150/88  Wt 208 lb (94.348 kg)      Review of Systems  Constitutional: Negative.   HENT: Negative for hearing loss, congestion, sore throat, rhinorrhea, dental problem, sinus pressure and tinnitus.   Eyes: Negative for pain, discharge and visual disturbance.  Respiratory: Negative for cough and shortness of breath.   Cardiovascular: Negative for chest pain, palpitations and leg swelling.  Gastrointestinal: Negative for nausea, vomiting, abdominal pain, diarrhea, constipation, blood in stool and abdominal distention.  Genitourinary: Negative for dysuria, urgency, frequency, hematuria, flank  pain, vaginal bleeding, vaginal discharge, difficulty urinating, vaginal pain and pelvic pain.  Musculoskeletal: Negative for joint swelling, arthralgias and gait problem.  Skin: Negative for rash.  Neurological: Negative for dizziness, syncope, speech difficulty, weakness, numbness and headaches.  Hematological: Negative for adenopathy.  Psychiatric/Behavioral: Negative for behavioral problems, dysphoric mood and agitation. The patient is not nervous/anxious.        Objective:   Physical Exam  Constitutional: She appears well-developed and well-nourished. No distress.       Weight 208 Blood pressure 140/82 both arms  Pulmonary/Chest: She has no wheezes.          Assessment & Plan:   HTN- improved Asthma-  stable CAD  Recheck 3 mon

## 2012-03-26 NOTE — Patient Instructions (Signed)
Limit your sodium (Salt) intake  You need to lose weight.  Consider a lower calorie diet and regular exercise.    It is important that you exercise regularly, at least 20 minutes 3 to 4 times per week.  If you develop chest pain or shortness of breath seek  medical attention.  Return in 3 months for follow-up  

## 2012-06-02 ENCOUNTER — Other Ambulatory Visit: Payer: Self-pay | Admitting: Internal Medicine

## 2012-06-25 ENCOUNTER — Ambulatory Visit: Payer: Self-pay | Admitting: Internal Medicine

## 2012-10-11 ENCOUNTER — Other Ambulatory Visit: Payer: Self-pay | Admitting: *Deleted

## 2012-10-11 MED ORDER — ALBUTEROL SULFATE HFA 108 (90 BASE) MCG/ACT IN AERS: 2.0000 | INHALATION_SPRAY | Freq: Four times a day (QID) | RESPIRATORY_TRACT | Status: DC | PRN

## 2013-02-16 ENCOUNTER — Telehealth: Payer: Self-pay | Admitting: Pulmonary Disease

## 2013-03-11 ENCOUNTER — Other Ambulatory Visit: Payer: Self-pay | Admitting: Internal Medicine

## 2013-07-01 ENCOUNTER — Ambulatory Visit (INDEPENDENT_AMBULATORY_CARE_PROVIDER_SITE_OTHER): Payer: Medicare PPO | Admitting: Internal Medicine

## 2013-07-01 ENCOUNTER — Encounter: Payer: Self-pay | Admitting: Internal Medicine

## 2013-07-01 VITALS — BP 186/90 | HR 81 | Temp 98.1°F | Resp 20 | Wt 232.0 lb

## 2013-07-01 DIAGNOSIS — T7589XS Other specified effects of external causes, sequela: Secondary | ICD-10-CM

## 2013-07-01 DIAGNOSIS — T783XXA Angioneurotic edema, initial encounter: Secondary | ICD-10-CM

## 2013-07-01 DIAGNOSIS — T788XXS Other adverse effects, not elsewhere classified, sequela: Secondary | ICD-10-CM

## 2013-07-01 DIAGNOSIS — T783XXS Angioneurotic edema, sequela: Secondary | ICD-10-CM

## 2013-07-01 DIAGNOSIS — J45909 Unspecified asthma, uncomplicated: Secondary | ICD-10-CM

## 2013-07-01 DIAGNOSIS — Z8679 Personal history of other diseases of the circulatory system: Secondary | ICD-10-CM

## 2013-07-01 DIAGNOSIS — I2510 Atherosclerotic heart disease of native coronary artery without angina pectoris: Secondary | ICD-10-CM

## 2013-07-01 DIAGNOSIS — I10 Essential (primary) hypertension: Secondary | ICD-10-CM

## 2013-07-01 LAB — MICROALBUMIN / CREATININE URINE RATIO: Microalb, Ur: 0.8 mg/dL (ref 0.0–1.9)

## 2013-07-01 LAB — COMPREHENSIVE METABOLIC PANEL
ALT: 21 U/L (ref 0–35)
AST: 22 U/L (ref 0–37)
CO2: 32 mEq/L (ref 19–32)
Chloride: 101 mEq/L (ref 96–112)
Sodium: 139 mEq/L (ref 135–145)
Total Protein: 7.3 g/dL (ref 6.0–8.3)

## 2013-07-01 LAB — LIPID PANEL
Cholesterol: 201 mg/dL — ABNORMAL HIGH (ref 0–200)
Total CHOL/HDL Ratio: 4
Triglycerides: 125 mg/dL (ref 0.0–149.0)

## 2013-07-01 LAB — CBC WITH DIFFERENTIAL/PLATELET
Basophils Absolute: 0 10*3/uL (ref 0.0–0.1)
Eosinophils Absolute: 0.2 10*3/uL (ref 0.0–0.7)
Eosinophils Relative: 3.2 % (ref 0.0–5.0)
MCV: 82.5 fl (ref 78.0–100.0)
Monocytes Absolute: 0.2 10*3/uL (ref 0.1–1.0)
Neutro Abs: 3.3 10*3/uL (ref 1.4–7.7)
Neutrophils Relative %: 56.1 % (ref 43.0–77.0)
Platelets: 181 10*3/uL (ref 150.0–400.0)
WBC: 5.8 10*3/uL (ref 4.5–10.5)

## 2013-07-01 LAB — LDL CHOLESTEROL, DIRECT: Direct LDL: 139.2 mg/dL

## 2013-07-01 LAB — TSH: TSH: 0.25 u[IU]/mL — ABNORMAL LOW (ref 0.35–5.50)

## 2013-07-01 LAB — HEMOGLOBIN A1C: Hgb A1c MFr Bld: 6.5 % (ref 4.6–6.5)

## 2013-07-01 MED ORDER — CHLORTHALIDONE 25 MG PO TABS: 25.0000 mg | ORAL_TABLET | Freq: Every day | ORAL | Status: DC

## 2013-07-01 MED ORDER — CLONIDINE HCL 0.2 MG PO TABS: 0.2000 mg | ORAL_TABLET | Freq: Two times a day (BID) | ORAL | Status: DC

## 2013-07-01 MED ORDER — SIMVASTATIN 20 MG PO TABS: 20.0000 mg | ORAL_TABLET | Freq: Every day | ORAL | Status: DC

## 2013-07-01 NOTE — Patient Instructions (Addendum)
Limit your sodium (Salt) intake  Avoid aspirin and anti-inflammatory medication  Discontinue amlodipine  Take all medications on your medicine list

## 2013-07-01 NOTE — Progress Notes (Signed)
Subjective:    Patient ID: [name], female    DOB: Dec 16, 1945, 68 y.o.   MRN: [id number]  HPI  67 year old patient who is seen today in followup. She has not been seen here in over one year. She was seen at the ED recently in [facility] for recurrent angioedema associated with accelerated hypertension. The patient had been off all her medications that she attributes to causing angioedema. She was last hospitalized here locally in 2012 DT recurrent angioedema. This has been attributed to ACE inhibition in the past the patient has also been on aspirin calcium blockers and metoprolol all of which have been associated with angioedema.  The patient has been noncompliant with her medication and his episodes appear to be spontaneous rather than associated with drug use. She is now back on amlodipine and is seen here today for followup of her high blood pressure She has a history of cerebral vascular disease and prior stroke; she also has a history of impaired glucose tolerance. She has a long history of worsening gait abnormality. She is accompanied by her daughter today.  No prior history of evaluation for AAE.  At the present time she is being treated her with histamine 1 and 2 blockers and doing quite well. She does have a history of asthma which has been stable. She has coronary artery disease which also has been stable. She has discontinued the statin therapy. Her parents she has been off all medications except for Advair and albuterol when necessary.  Past Medical History  Diagnosis Date  . 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 ASTHMA 04/16/2007  . 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 CORONARY ARTERY DISEASE 08/29/2008  . HYPERTENSION 04/16/2007  . URI 10/25/2009  . Hyperlipidemia   . Environmental allergies     History   Social History  . Marital Status: Widowed    Spouse Name: N/A    Number of Children: N/A  . Years of Education: N/A   Occupational History  . Not on file.    Social History Main Topics  . Smoking status: Never Smoker   . Smokeless tobacco: Never Used  . Alcohol Use: No  . Drug Use: No  . Sexual Activity: Not on file   Other Topics Concern  . Not on file   Social History Narrative  . No narrative on file    Past Surgical History  Procedure Laterality Date  . Cardiac catheterization      No family history on file.  No Known Allergies  Current Outpatient Prescriptions on File Prior to Visit  Medication Sig Dispense Refill  . ADVAIR DISKUS 500-50 MCG/DOSE AEPB TAKE 1 PUFF BY MOUTH EVERY 12 HOURS  60 each  3  . albuterol (PROAIR HFA) 108 (90 BASE) MCG/ACT inhaler Inhale 2 puffs into the lungs every 6 (six) hours as needed for wheezing.  1 Inhaler  5  . amLODipine (NORVASC) 10 MG tablet Take 1 tablet (10 mg total) by mouth daily.  90 tablet  3  . clopidogrel (PLAVIX) 75 MG tablet Take 1 tablet (75 mg total) by mouth daily.  90 tablet  6  . metoprolol (LOPRESSOR) 50 MG tablet Take 1 tablet (50 mg total) by mouth 2 (two) times daily.  180 tablet  3  . [DISCONTINUED] losartan-hydrochlorothiazide (HYZAAR) 100-25 MG per tablet Take 1 tablet by mouth daily.  30 tablet  3   No current facility-administered medications on file prior to visit.    BP 186/90  Pulse 81  Temp(Src)  98.1 F (36.7 C) (Oral)  Resp 20  Wt 232 lb (105.235 kg)  SpO2 97%       Review of Systems  Constitutional: Negative.   HENT: Negative for congestion, dental problem, hearing loss, rhinorrhea, sinus pressure, sore throat and tinnitus.   Eyes: Negative for pain, discharge and visual disturbance.  Respiratory: Negative for cough and shortness of breath.   Cardiovascular: Negative for chest pain, palpitations and leg swelling.  Gastrointestinal: Negative for nausea, vomiting, abdominal pain, diarrhea, constipation, blood in stool and abdominal distention.  Genitourinary: Negative for dysuria, urgency, frequency, hematuria, flank pain, vaginal bleeding,  vaginal discharge, difficulty urinating, vaginal pain and pelvic pain.  Musculoskeletal: Negative for arthralgias, gait problem and joint swelling.  Skin: Negative for rash.  Neurological: Negative for dizziness, syncope, speech difficulty, weakness, numbness and headaches.  Hematological: Negative for adenopathy.  Psychiatric/Behavioral: Negative for behavioral problems, dysphoric mood and agitation. The patient is not nervous/anxious.        Objective:   Physical Exam  Constitutional: She is oriented to person, place, and time. She appears well-developed and well-nourished.  HENT:  Head: Normocephalic.  Right Ear: External ear normal.  Left Ear: External ear normal.  Mouth/Throat: Oropharynx is clear and moist.  Eyes: Conjunctivae and EOM are normal. Pupils are equal, round, and reactive to light.  Chronic nystagmus and diminished vision Right external strabismus  Neck: Normal range of motion. Neck supple. No thyromegaly present.  Bilateral bruits in the carotid and supraclavicular areas  Cardiovascular: Normal rate and regular rhythm.   Murmur heard. Grade 3/6 systolic murmur  Pulmonary/Chest: Effort normal and breath sounds normal.  Abdominal: Soft. Bowel sounds are normal. She exhibits no mass. There is no tenderness.  Musculoskeletal: Normal range of motion.  Lymphadenopathy:    She has no cervical adenopathy.  Neurological: She is alert and oriented to person, place, and time.  Very unsteady gait Bilateral weakness of foot dorsiflexion-appears to have a mild foot drop Patellar reflexes are symmetrical Absent Achilles reflexes  Intact soft touch and vibratory sensation  Skin: Skin is warm and dry. No rash noted.  Psychiatric: She has a normal mood and affect. Her behavior is normal.          Assessment & Plan:   Hypertension. Poor control due to noncompliance. Will resume the chlorthalidone and Catapres at least temporarily and avoid all medications associated  with angioedema including metoprolol amlodipine aspirin and anti-inflammatory medications AAE- we'll check a C1q and C4 complement levels Abnormal gait. Probable multifactorial. Will reassess in one month consider nerve conduction studies History of impaired glucose tolerance. We'll check a hemoglobin A1c Coronary artery disease. We'll resume statin therapy  Laboratory update

## 2013-07-02 LAB — C4 COMPLEMENT: C4 Complement: 28 mg/dL (ref 10–40)

## 2013-07-07 LAB — COMPLEMENT COMPONENT C1Q: Complement C1Q: 6.8 mg/dL (ref 5.0–8.6)

## 2013-07-29 ENCOUNTER — Ambulatory Visit: Payer: Self-pay | Admitting: Internal Medicine

## 2013-08-08 NOTE — Telephone Encounter (Signed)
error 

## 2013-08-31 ENCOUNTER — Encounter: Payer: Self-pay | Admitting: Internal Medicine

## 2013-08-31 ENCOUNTER — Ambulatory Visit (INDEPENDENT_AMBULATORY_CARE_PROVIDER_SITE_OTHER): Payer: Medicare PPO | Admitting: Internal Medicine

## 2013-08-31 VITALS — BP 136/90 | HR 54 | Temp 97.9°F | Resp 18 | Wt 223.0 lb

## 2013-08-31 DIAGNOSIS — T783XXA Angioneurotic edema, initial encounter: Secondary | ICD-10-CM

## 2013-08-31 DIAGNOSIS — I10 Essential (primary) hypertension: Secondary | ICD-10-CM

## 2013-08-31 DIAGNOSIS — J45909 Unspecified asthma, uncomplicated: Secondary | ICD-10-CM

## 2013-08-31 NOTE — Progress Notes (Signed)
Subjective:    Patient ID: [name], female    DOB: [birth year], 68 y.o.   MRN: [id number]  HPI  68 year old patient who is seen today in followup. She has a history of recurrent angioedema. This has been fairly stable since her last visit and all of possible offending medications have been discontinued. Her blood pressure has been controlled on chlorthalidone and Catapres. She has had some dry mouth issues but manageable. C4 complement level was obtained that was normal. C1q level has been drawn but results not available. In general doing quite well.  Past Medical History  Diagnosis Date  . 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 ASTHMA 04/16/2007  . 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 CORONARY ARTERY DISEASE 08/29/2008  . HYPERTENSION 04/16/2007  . URI 10/25/2009  . Hyperlipidemia   . Environmental allergies     History   Social History  . Marital Status: Widowed    Spouse Name: N/A    Number of Children: N/A  . Years of Education: N/A   Occupational History  . Not on file.   Social History Main Topics  . Smoking status: Never Smoker   . Smokeless tobacco: Never Used  . Alcohol Use: No  . Drug Use: No  . Sexual Activity: Not on file   Other Topics Concern  . Not on file   Social History Narrative  . No narrative on file    Past Surgical History  Procedure Laterality Date  . Cardiac catheterization      No family history on file.  No Known Allergies  Current Outpatient Prescriptions on File Prior to Visit  Medication Sig Dispense Refill  . ADVAIR DISKUS 500-50 MCG/DOSE AEPB TAKE 1 PUFF BY MOUTH EVERY 12 HOURS  60 each  3  . chlorthalidone (HYGROTON) 25 MG tablet Take 1 tablet (25 mg total) by mouth daily.  90 tablet  6  . cloNIDine (CATAPRES) 0.2 MG tablet Take 1 tablet (0.2 mg total) by mouth 2 (two) times daily.  180 tablet  6  . simvastatin (ZOCOR) 20 MG tablet Take 1 tablet (20 mg total) by mouth at bedtime.  90 tablet  6  . albuterol (PROAIR HFA) 108  (90 BASE) MCG/ACT inhaler Inhale 2 puffs into the lungs every 6 (six) hours as needed for wheezing.  1 Inhaler  5  . clopidogrel (PLAVIX) 75 MG tablet Take 1 tablet (75 mg total) by mouth daily.  90 tablet  6  . famotidine (PEPCID) 20 MG tablet Take 20 mg by mouth 2 (two) times daily.       . metoprolol (LOPRESSOR) 50 MG tablet Take 1 tablet (50 mg total) by mouth 2 (two) times daily.  180 tablet  3  . [DISCONTINUED] losartan-hydrochlorothiazide (HYZAAR) 100-25 MG per tablet Take 1 tablet by mouth daily.  30 tablet  3   No current facility-administered medications on file prior to visit.    BP 136/90  Pulse 54  Temp(Src) 97.9 F (36.6 C) (Oral)  Resp 18  Wt 223 lb (101.152 kg)  SpO2 96%       Review of Systems  Constitutional: Negative.   HENT: Negative for congestion, dental problem, hearing loss, rhinorrhea, sinus pressure, sore throat and tinnitus.        Dry mouth  Eyes: Negative for pain, discharge and visual disturbance.  Respiratory: Negative for cough and shortness of breath.   Cardiovascular: Negative for chest pain, palpitations and leg swelling.  Gastrointestinal: Negative for nausea, vomiting, abdominal pain,  diarrhea, constipation, blood in stool and abdominal distention.  Genitourinary: Negative for dysuria, urgency, frequency, hematuria, flank pain, vaginal bleeding, vaginal discharge, difficulty urinating, vaginal pain and pelvic pain.  Musculoskeletal: Negative for arthralgias, gait problem and joint swelling.  Skin: Negative for rash.  Neurological: Negative for dizziness, syncope, speech difficulty, weakness, numbness and headaches.  Hematological: Negative for adenopathy.  Psychiatric/Behavioral: Negative for behavioral problems, dysphoric mood and agitation. The patient is not nervous/anxious.        Objective:   Physical Exam  Constitutional: She appears well-developed and well-nourished. No distress.  Blood pressure 130/84          Assessment &  Plan:   Hypertension. Reasonable control Recurrent angioedema stable  We'll continue present regimen Recheck 3 months or as needed

## 2013-08-31 NOTE — Progress Notes (Signed)
Pre-visit discussion using our clinic review tool. No additional management support is needed unless otherwise documented below in the visit note.  

## 2013-08-31 NOTE — Patient Instructions (Signed)
Limit your sodium (Salt) intake  Return in 3 months for follow-up  You need to lose weight.  Consider a lower calorie diet and regular exercise.

## 2013-09-26 ENCOUNTER — Telehealth: Payer: Self-pay | Admitting: Internal Medicine

## 2013-09-26 NOTE — Telephone Encounter (Signed)
Relevant patient education mailed to patient.  

## 2013-10-11 ENCOUNTER — Other Ambulatory Visit: Payer: Self-pay | Admitting: Internal Medicine

## 2013-10-25 ENCOUNTER — Encounter (HOSPITAL_COMMUNITY): Payer: Self-pay | Admitting: Emergency Medicine

## 2013-10-25 ENCOUNTER — Emergency Department (HOSPITAL_COMMUNITY): Payer: Medicare PPO

## 2013-10-25 ENCOUNTER — Telehealth: Payer: Self-pay | Admitting: Internal Medicine

## 2013-10-25 ENCOUNTER — Emergency Department (HOSPITAL_COMMUNITY)
Admission: EM | Admit: 2013-10-25 | Discharge: 2013-10-25 | Disposition: A | Discharge status: Home / Self Care | Payer: Medicare PPO | Attending: Emergency Medicine | Admitting: Emergency Medicine

## 2013-10-25 DIAGNOSIS — Z8673 Personal history of transient ischemic attack (TIA), and cerebral infarction without residual deficits: Secondary | ICD-10-CM | POA: Insufficient documentation

## 2013-10-25 DIAGNOSIS — E669 Obesity, unspecified: Secondary | ICD-10-CM | POA: Insufficient documentation

## 2013-10-25 DIAGNOSIS — R269 Unspecified abnormalities of gait and mobility: Secondary | ICD-10-CM | POA: Insufficient documentation

## 2013-10-25 DIAGNOSIS — E785 Hyperlipidemia, unspecified: Secondary | ICD-10-CM | POA: Insufficient documentation

## 2013-10-25 DIAGNOSIS — Y9389 Activity, other specified: Secondary | ICD-10-CM | POA: Insufficient documentation

## 2013-10-25 DIAGNOSIS — Y9289 Other specified places as the place of occurrence of the external cause: Secondary | ICD-10-CM | POA: Insufficient documentation

## 2013-10-25 DIAGNOSIS — Z79899 Other long term (current) drug therapy: Secondary | ICD-10-CM | POA: Insufficient documentation

## 2013-10-25 DIAGNOSIS — Z043 Encounter for examination and observation following other accident: Secondary | ICD-10-CM | POA: Insufficient documentation

## 2013-10-25 DIAGNOSIS — I2510 Atherosclerotic heart disease of native coronary artery without angina pectoris: Secondary | ICD-10-CM | POA: Insufficient documentation

## 2013-10-25 DIAGNOSIS — N390 Urinary tract infection, site not specified: Secondary | ICD-10-CM | POA: Insufficient documentation

## 2013-10-25 DIAGNOSIS — R296 Repeated falls: Secondary | ICD-10-CM | POA: Insufficient documentation

## 2013-10-25 DIAGNOSIS — J45909 Unspecified asthma, uncomplicated: Secondary | ICD-10-CM | POA: Insufficient documentation

## 2013-10-25 DIAGNOSIS — W19XXXA Unspecified fall, initial encounter: Secondary | ICD-10-CM

## 2013-10-25 DIAGNOSIS — I10 Essential (primary) hypertension: Secondary | ICD-10-CM | POA: Insufficient documentation

## 2013-10-25 DIAGNOSIS — Z9889 Other specified postprocedural states: Secondary | ICD-10-CM | POA: Insufficient documentation

## 2013-10-25 LAB — CBC WITH DIFFERENTIAL/PLATELET
BASOS ABS: 0 10*3/uL (ref 0.0–0.1)
Basophils Relative: 1 % (ref 0–1)
EOS PCT: 4 % (ref 0–5)
Eosinophils Absolute: 0.2 10*3/uL (ref 0.0–0.7)
HEMATOCRIT: 37.7 % (ref 36.0–46.0)
HEMOGLOBIN: 12.3 g/dL (ref 12.0–15.0)
Lymphocytes Relative: 39 % (ref 12–46)
Lymphs Abs: 2.2 10*3/uL (ref 0.7–4.0)
MCH: 27.7 pg (ref 26.0–34.0)
MCHC: 32.6 g/dL (ref 30.0–36.0)
MCV: 84.9 fL (ref 78.0–100.0)
Monocytes Absolute: 0.2 10*3/uL (ref 0.1–1.0)
Monocytes Relative: 4 % (ref 3–12)
NEUTROS ABS: 2.9 10*3/uL (ref 1.7–7.7)
Neutrophils Relative %: 52 % (ref 43–77)
Platelets: 170 10*3/uL (ref 150–400)
RBC: 4.44 MIL/uL (ref 3.87–5.11)
RDW: 15 % (ref 11.5–15.5)
WBC: 5.5 10*3/uL (ref 4.0–10.5)

## 2013-10-25 LAB — URINALYSIS, ROUTINE W REFLEX MICROSCOPIC
BILIRUBIN URINE: NEGATIVE
GLUCOSE, UA: NEGATIVE mg/dL
HGB URINE DIPSTICK: NEGATIVE
Ketones, ur: 15 mg/dL — AB
Nitrite: POSITIVE — AB
PH: 6.5 (ref 5.0–8.0)
Protein, ur: NEGATIVE mg/dL
Specific Gravity, Urine: 1.03 (ref 1.005–1.030)
Urobilinogen, UA: 1 mg/dL (ref 0.0–1.0)

## 2013-10-25 LAB — COMPREHENSIVE METABOLIC PANEL
ALK PHOS: 91 U/L (ref 39–117)
ALT: 19 U/L (ref 0–35)
AST: 22 U/L (ref 0–37)
Albumin: 3.9 g/dL (ref 3.5–5.2)
BUN: 14 mg/dL (ref 6–23)
CO2: 32 mEq/L (ref 19–32)
Calcium: 9.9 mg/dL (ref 8.4–10.5)
Chloride: 101 mEq/L (ref 96–112)
Creatinine, Ser: 0.82 mg/dL (ref 0.50–1.10)
GFR calc Af Amer: 85 mL/min — ABNORMAL LOW (ref >90)
GFR calc non Af Amer: 73 mL/min — ABNORMAL LOW (ref >90)
Glucose, Bld: 157 mg/dL — ABNORMAL HIGH (ref 70–99)
POTASSIUM: 3.6 meq/L — AB (ref 3.7–5.3)
SODIUM: 144 meq/L (ref 137–147)
TOTAL PROTEIN: 7.3 g/dL (ref 6.0–8.3)
Total Bilirubin: 0.5 mg/dL (ref 0.3–1.2)

## 2013-10-25 LAB — URINE MICROSCOPIC-ADD ON

## 2013-10-25 MED ORDER — CIPROFLOXACIN HCL 500 MG PO TABS
500.0000 mg | ORAL_TABLET | Freq: Once | ORAL | Status: AC
  Administered 2013-10-25: 500 mg via ORAL
  Filled 2013-10-25: qty 1

## 2013-10-25 MED ORDER — CIPROFLOXACIN HCL 500 MG PO TABS: 500.0000 mg | ORAL_TABLET | Freq: Two times a day (BID) | ORAL | Status: DC

## 2013-10-25 MED ORDER — CEFPODOXIME PROXETIL 200 MG PO TABS
200.0000 mg | ORAL_TABLET | Freq: Once | ORAL | Status: DC
  Filled 2013-10-25: qty 1

## 2013-10-25 NOTE — ED Notes (Signed)
Patient unable to void while using bedside commode. ED Tech informed to catheterize patient.

## 2013-10-25 NOTE — Telephone Encounter (Signed)
Patient Information:  Caller Name: Olegario MessierKathy  Phone: [phone]  Patient: Anita BerkshireLindsay, Rickie  Gender: Female  DOB: [birth year]  Age: [age] Years  PCP: Eleonore ChiquitoKwiatkowski, Peter (Family Practice > 3635yrs old)  Office Follow Up:  Does the office need to follow up with this patient?: No  Instructions For The Office: N/A  RN Note:  Nurse/Kathy calling regarding Patient Alvira PhilipsGeraldine who is currently at work and unable to walk.  Prior history of multiple CVA.  Patient refusing to activate 911 or go to hospital.  Requesting office be notified.  Advised facility 911 needs to be activated NOW.  Symptoms  Reason For Call & Symptoms: weakness in lower extremities.  Reviewed Health History In EMR: Yes  Reviewed Medications In EMR: Yes  Reviewed Allergies In EMR: Yes  Reviewed Surgeries / Procedures: Yes  Date of Onset of Symptoms: 10/25/2013  Guideline(s) Used:  Weakness (Generalized) and Fatigue  Disposition Per Guideline:   Call EMS 911 Now  Reason For Disposition Reached:   Severe weakness (i.e., unable to walk or barely able to walk, requires support) and new onset or worsening  Advice Given:  N/A  Patient Will Follow Care Advice:  YES

## 2013-10-25 NOTE — ED Notes (Signed)
Pt transported to CT ?

## 2013-10-25 NOTE — ED Notes (Signed)
Provided patient with and assisted to bedside commode.

## 2013-10-25 NOTE — ED Notes (Signed)
Pt stating she is feeling fine.  No new complaints.

## 2013-10-25 NOTE — ED Notes (Signed)
not able to stand well states body collapsed  This am and at work but was able to walk after. Equal grip  No def in arms and legs no numbness or tingling pt [id number]

## 2013-10-25 NOTE — ED Provider Notes (Signed)
CSN: [id number]     Arrival date & time 10/25/13  1437 History   First MD Initiated Contact with Patient 10/25/13 1648     Chief Complaint  Patient presents with  . Weakness      Patient is a 68 y.o. female presenting with weakness and fall.  Weakness Associated symptoms include weakness. Pertinent negatives include no abdominal pain, anorexia, arthralgias, change in bowel habit, chest pain, chills, congestion, coughing, diaphoresis, fatigue, fever, headaches, joint swelling, myalgias, nausea, neck pain, numbness, rash, sore throat, swollen glands, urinary symptoms, vertigo, visual change or vomiting.  Fall This is a recurrent problem. The current episode started today. The problem occurs every several days. The problem has been resolved. Associated symptoms include weakness. Pertinent negatives include no abdominal pain, anorexia, arthralgias, change in bowel habit, chest pain, chills, congestion, coughing, diaphoresis, fatigue, fever, headaches, joint swelling, myalgias, nausea, neck pain, numbness, rash, sore throat, swollen glands, urinary symptoms, vertigo, visual change or vomiting. The symptoms are aggravated by standing. She has tried nothing for the symptoms.    Past Medical History  Diagnosis Date  . 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 ASTHMA 04/16/2007  . 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 CORONARY ARTERY DISEASE 08/29/2008  . HYPERTENSION 04/16/2007  . URI 10/25/2009  . Hyperlipidemia   . Environmental allergies    Past Surgical History  Procedure Laterality Date  . Cardiac catheterization     No family history on file. History  Substance Use Topics  . Smoking status: Never Smoker   . Smokeless tobacco: Never Used  . Alcohol Use: No   OB History   Grav Para Term Preterm Abortions TAB SAB Ect Mult Living                 Review of Systems  Constitutional: Negative for fever, chills, diaphoresis and fatigue.  HENT: Negative for congestion and sore throat.   Respiratory:  Negative for cough.   Cardiovascular: Negative for chest pain.  Gastrointestinal: Negative for nausea, vomiting, abdominal pain, anorexia and change in bowel habit.  Musculoskeletal: Negative for arthralgias, joint swelling, myalgias and neck pain.  Skin: Negative for rash.  Neurological: Positive for weakness. Negative for vertigo, numbness and headaches.      Allergies  Eggs or egg-derived products  Home Medications   Current Outpatient Rx  Name  Route  Sig  Dispense  Refill  . ADVAIR DISKUS 500-50 MCG/DOSE AEPB      TAKE 1 PUFF BY MOUTH EVERY 12 HOURS   60 each   3   . chlorthalidone (HYGROTON) 25 MG tablet   Oral   Take 1 tablet (25 mg total) by mouth daily.   90 tablet   6   . cloNIDine (CATAPRES) 0.2 MG tablet   Oral   Take 1 tablet (0.2 mg total) by mouth 2 (two) times daily.   180 tablet   6   . simvastatin (ZOCOR) 20 MG tablet   Oral   Take 1 tablet (20 mg total) by mouth at bedtime.   90 tablet   6   . EXPIRED: metoprolol (LOPRESSOR) 50 MG tablet   Oral   Take 1 tablet (50 mg total) by mouth 2 (two) times daily.   180 tablet   3    BP 132/73  Pulse 60  Temp(Src) 98 F (36.7 C) (Oral)  Resp 16  SpO2 99% Physical Exam  Constitutional: She is oriented to person, place, and time. She appears well-developed and well-nourished. No distress.  Obese  HENT:  Head: Normocephalic and atraumatic.  Nose: Nose normal.  Mouth/Throat: Oropharynx is clear and moist.  Eyes: Pupils are equal, round, and reactive to light. Right eye exhibits no discharge. Left eye exhibits no discharge. No scleral icterus.  Neck: Normal range of motion. Neck supple. No tracheal deviation present.  Cardiovascular: Normal rate, regular rhythm, normal heart sounds and intact distal pulses.   Pulmonary/Chest: Effort normal and breath sounds normal. She has no rales.  Abdominal: Soft. Bowel sounds are normal. She exhibits no distension. There is no tenderness. There is no rebound  and no guarding.  Musculoskeletal: Normal range of motion. She exhibits no tenderness.  Neurological: She is alert and oriented to person, place, and time. She displays no atrophy, no tremor and normal reflexes. No cranial nerve deficit or sensory deficit. She exhibits normal muscle tone. She displays a negative Romberg sign. She displays no seizure activity. Gait abnormal. Coordination normal. GCS eye subscore is 4. GCS verbal subscore is 5. GCS motor subscore is 6.  Reflex Scores:      Patellar reflexes are 2+ on the right side and 2+ on the left side.      Achilles reflexes are 2+ on the right side and 2+ on the left side. Unsteady gait (baseline per patient and daughter) Patient is supposed to be using a walker.  Skin: Skin is warm and dry. No rash noted.  Psychiatric: She has a normal mood and affect. Her behavior is normal.    ED Course  Procedures (including critical care time) Labs Review Labs Reviewed  COMPREHENSIVE METABOLIC PANEL - Abnormal; Notable for the following:    Potassium 3.6 (*)    Glucose, Bld 157 (*)    GFR calc non Af Amer 73 (*)    GFR calc Af Amer 85 (*)    All other components within normal limits  URINALYSIS, ROUTINE W REFLEX MICROSCOPIC - Abnormal; Notable for the following:    APPearance CLOUDY (*)    Ketones, ur 15 (*)    Nitrite POSITIVE (*)    Leukocytes, UA SMALL (*)    All other components within normal limits  URINE MICROSCOPIC-ADD ON - Abnormal; Notable for the following:    Bacteria, UA MANY (*)    All other components within normal limits  CBC WITH DIFFERENTIAL   Imaging Review Ct Head Wo Contrast  10/25/2013   CLINICAL DATA:  Multiple falls  EXAM: CT HEAD WITHOUT CONTRAST  TECHNIQUE: Contiguous axial images were obtained from the base of the skull through the vertex without intravenous contrast.  COMPARISON:  CT HEAD W/O CM dated 06/15/2011  FINDINGS: No acute intracranial hemorrhage. No focal mass lesion. No CT evidence of acute infarction. No  midline shift or mass effect. No hydrocephalus. Basilar cisterns are patent.  There is extensive periventricular and subcortical white matter hypodensities not changed prior. Infarction the right basal ganglia noted. Paranasal sinuses and mastoid air cells are clear.  IMPRESSION: 1. No acute intracranial findings. 2. Extensive white matter microvascular disease. 3. 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   Electronically Signed   By: Genevive Bi M.D.   On: 10/25/2013 17:38     EKG Interpretation   Date/Time:  Tuesday October 25 2013 14:44:22 EST Ventricular Rate:  72 PR Interval:  198 QRS Duration: 94 QT Interval:  410 QTC Calculation: 448 R Axis:   48 Text Interpretation:  Normal sinus rhythm Normal ECG compared to prior, no  PVCs noted. Confirmed by [facility]  MD, Thurston Pounds (  [id number]) on 10/25/2013 8:48:08 PM      MDM   Final diagnoses:  UTI (lower urinary tract infection)  Fall    67 yo F in NAD AFVSS non toxic appearing who presents with 2 falls today. Patient has had an unsteady gait for months and has been progressively getting worse. Per daughter the patient is supposed to be using a walker but she refuses to. Neuro exam here non focal. Patient has good finger to nose, normal Romberg, No nystagmus , dysmetria or dysarthria. Doubt posterior cerebellar stroke.  No headache. Given the two falls today will obtain a head CT scan. Patient does report foul smelling urine will obtain a UA. Per patient she was on her way to work today in the bus and when she stood up and took a few steps her legs felt weak and "gave out on me". The patient was caught by a passenger and did not strike her head. She subsequently went to work. Case co managed with my attending Dr. Loretha StaplerWofford.   10:06 PM Head CT scan with NAICA. UA with UTI. Will treat with Vantin. First dose given in ED. Patient and family up dated. Patient tolerating PO. No CVA TTP. Doubt pyelonephritis.   Thorough discussion with patient and  family on plan, findings return precautions. Will follow up with PCP in 1-2 days. Patient was instructed to use her walker. She continues to refuse this. Strongly encouraged family to ensure she uses her walker and she is at high risk for falls.    Nadara MustardJulio Jaxyn Mestas, MD 10/25/13 [phone]

## 2013-10-25 NOTE — ED Provider Notes (Signed)
I saw and evaluated the patient, reviewed the resident's note and I agree with the findings and plan.   EKG Interpretation   Date/Time:  Tuesday October 25 2013 14:44:22 EST Ventricular Rate:  72 PR Interval:  198 QRS Duration: 94 QT Interval:  410 QTC Calculation: 448 R Axis:   48 Text Interpretation:  Normal sinus rhythm Normal ECG compared to prior, no  PVCs noted. Confirmed by [facility]  MD, TREY [phone]([id number]) on 10/25/2013 8:48:4908 PM      68 year old female presenting with generalized weakness and a few falls during which her legs seem to give out on her. She is supposed to be using a walker, but has not been using it. On exam, well appearing, no distress, normal respiratory effort, normal perfusion, no head trauma, abdomen soft nontender.  Workup notable for a UTI. She is nontoxic and nonseptic. Plan for outpatient antibiotic treatment and close PCP followup.  Clinical Impression: 1. UTI (lower urinary tract infection)   2. Fall       Candyce ChurnJohn David Aymee Fomby III, MD 10/25/13 2234

## 2013-10-25 NOTE — Discharge Instructions (Signed)
Fall Prevention and Home Safety Falls cause injuries and can affect all age groups. It is possible to use preventive measures to significantly decrease the likelihood of falls. There are many simple measures which can make your home safer and prevent falls. OUTDOORS  Repair cracks and edges of walkways and driveways.  Remove high doorway thresholds.  Trim shrubbery on the main path into your home.  Have good outside lighting.  Clear walkways of tools, rocks, debris, and clutter.  Check that handrails are not broken and are securely fastened. Both sides of steps should have handrails.  Have leaves, snow, and ice cleared regularly.  Use sand or salt on walkways during winter months.  In the garage, clean up grease or oil spills. BATHROOM  Install night lights.  Install grab bars by the toilet and in the tub and shower.  Use non-skid mats or decals in the tub or shower.  Place a plastic non-slip stool in the shower to sit on, if needed.  Keep floors dry and clean up all water on the floor immediately.  Remove soap buildup in the tub or shower on a regular basis.  Secure bath mats with non-slip, double-sided rug tape.  Remove throw rugs and tripping hazards from the floors. BEDROOMS  Install night lights.  Make sure a bedside light is easy to reach.  Do not use oversized bedding.  Keep a telephone by your bedside.  Have a firm chair with side arms to use for getting dressed.  Remove throw rugs and tripping hazards from the floor. KITCHEN  Keep handles on pots and pans turned toward the center of the stove. Use back burners when possible.  Clean up spills quickly and allow time for drying.  Avoid walking on wet floors.  Avoid hot utensils and knives.  Position shelves so they are not too high or low.  Place commonly used objects within easy reach.  If necessary, use a sturdy step stool with a grab bar when reaching.  Keep electrical cables out of the  way.  Do not use floor polish or wax that makes floors slippery. If you must use wax, use non-skid floor wax.  Remove throw rugs and tripping hazards from the floor. STAIRWAYS  Never leave objects on stairs.  Place handrails on both sides of stairways and use them. Fix any loose handrails. Make sure handrails on both sides of the stairways are as long as the stairs.  Check carpeting to make sure it is firmly attached along stairs. Make repairs to worn or loose carpet promptly.  Avoid placing throw rugs at the top or bottom of stairways, or properly secure the rug with carpet tape to prevent slippage. Get rid of throw rugs, if possible.  Have an electrician put in a light switch at the top and bottom of the stairs. OTHER FALL PREVENTION TIPS  Wear low-heel or rubber-soled shoes that are supportive and fit well. Wear closed toe shoes.  When using a stepladder, make sure it is fully opened and both spreaders are firmly locked. Do not climb a closed stepladder.  Add color or contrast paint or tape to grab bars and handrails in your home. Place contrasting color strips on first and last steps.  Learn and use mobility aids as needed. Install an electrical emergency response system.  Turn on lights to avoid dark areas. Replace light bulbs that burn out immediately. Get light switches that glow.  Arrange furniture to create clear pathways. Keep furniture in the same place.  Firmly attach carpet with non-skid or double-sided tape. °· Eliminate uneven floor surfaces. °· Select a carpet pattern that does not visually hide the edge of steps. °· Be aware of all pets. °OTHER HOME SAFETY TIPS °· Set the water temperature for 120° F (48.8° C). °· Keep emergency numbers on or near the telephone. °· Keep smoke detectors on every level of the home and near sleeping areas. °Document Released: 08/01/2002 Document Revised: 02/10/2012 Document Reviewed: 10/31/2011 °ExitCare® Patient Information ©2014  [employer]. °Urinary Tract Infection °Urinary tract infections (UTIs) can develop anywhere along your urinary tract. Your urinary tract is your body's drainage system for removing wastes and extra water. Your urinary tract includes two kidneys, two ureters, a bladder, and a urethra. Your kidneys are a pair of bean-shaped organs. Each kidney is about the size of your fist. They are located below your ribs, one on each side of your spine. °CAUSES °Infections are caused by microbes, which are microscopic organisms, including fungi, viruses, and bacteria. These organisms are so small that they can only be seen through a microscope. Bacteria are the microbes that most commonly cause UTIs. °SYMPTOMS  °Symptoms of UTIs may vary by age and gender of the patient and by the location of the infection. Symptoms in young women typically include a frequent and intense urge to urinate and a painful, burning feeling in the bladder or urethra during urination. Older women and men are more likely to be tired, shaky, and weak and have muscle aches and abdominal pain. A fever may mean the infection is in your kidneys. Other symptoms of a kidney infection include pain in your back or sides below the ribs, nausea, and vomiting. °DIAGNOSIS °To diagnose a UTI, your caregiver will ask you about your symptoms. Your caregiver also will ask to provide a urine sample. The urine sample will be tested for bacteria and white blood cells. White blood cells are made by your body to help fight infection. °TREATMENT  °Typically, UTIs can be treated with medication. Because most UTIs are caused by a bacterial infection, they usually can be treated with the use of antibiotics. The choice of antibiotic and length of treatment depend on your symptoms and the type of bacteria causing your infection. °HOME CARE INSTRUCTIONS °· If you were prescribed antibiotics, take them exactly as your caregiver instructs you. Finish the medication even if you feel  better after you have only taken some of the medication. °· Drink enough water and fluids to keep your urine clear or pale yellow. °· Avoid caffeine, tea, and carbonated beverages. They tend to irritate your bladder. °· Empty your bladder often. Avoid holding urine for long periods of time. °· Empty your bladder before and after sexual intercourse. °· After a bowel movement, women should cleanse from front to back. Use each tissue only once. °SEEK MEDICAL CARE IF:  °· You have back pain. °· You develop a fever. °· Your symptoms do not begin to resolve within 3 days. °SEEK IMMEDIATE MEDICAL CARE IF:  °· You have severe back pain or lower abdominal pain. °· You develop chills. °· You have nausea or vomiting. °· You have continued burning or discomfort with urination. °MAKE SURE YOU:  °· Understand these instructions. °· Will watch your condition. °· Will get help right away if you are not doing well or get worse. °Document Released: 05/21/2005 Document Revised: 02/10/2012 Document Reviewed: 09/19/2011 °ExitCare® Patient Information ©2014 [employer]. ° °

## 2013-10-25 NOTE — Telephone Encounter (Signed)
Spoke to [place], told her pt must go to ED to be evaluated.

## 2013-10-27 NOTE — ED Provider Notes (Signed)
I saw and evaluated the patient, reviewed the resident's note and I agree with the findings and plan.   EKG Interpretation   Date/Time:  Tuesday October 25 2013 14:44:22 EST Ventricular Rate:  72 PR Interval:  198 QRS Duration: 94 QT Interval:  410 QTC Calculation: 448 R Axis:   48 Text Interpretation:  Normal sinus rhythm Normal ECG compared to prior, no  PVCs noted. Confirmed by [facility]  [facility], TREY [phone]) on 10/25/2013 8:48:08 PM        Anita ChurnJohn David Garnette Greb III, MD 10/27/13 1024

## 2013-10-28 ENCOUNTER — Encounter: Payer: Self-pay | Admitting: Family

## 2013-10-28 ENCOUNTER — Ambulatory Visit (INDEPENDENT_AMBULATORY_CARE_PROVIDER_SITE_OTHER): Payer: Medicare PPO | Admitting: Family

## 2013-10-28 VITALS — BP 130/86 | HR 65 | Wt 220.0 lb

## 2013-10-28 DIAGNOSIS — I10 Essential (primary) hypertension: Secondary | ICD-10-CM

## 2013-10-28 DIAGNOSIS — N390 Urinary tract infection, site not specified: Secondary | ICD-10-CM

## 2013-10-28 NOTE — Progress Notes (Signed)
Subjective:    Patient ID: [name], female    DOB: December 06, 1945, 68 y.o.   MRN: [id number]  HPI  68 year old African American female, legally blind, is in today as an emergency department followup from 10/25/2013. She presented with bilateral lower should any weakness, burning with urination, and foul-smelling urine. She was found to have a urinary tract infection and is currently taking Cipro 500 mg twice a day. He is tolerating the medication well. Symptoms are improving. Denies any odor, frequency or urgency.   Review of Systems  Constitutional: Negative.   Respiratory: Negative.   Cardiovascular: Negative.   Gastrointestinal: Negative.   Genitourinary: Negative.   Musculoskeletal: Negative.   Skin: Negative.   Neurological: Negative.   Psychiatric/Behavioral: Negative.    Past Medical History  Diagnosis Date  . 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 ASTHMA 04/16/2007  . 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 CORONARY ARTERY DISEASE 08/29/2008  . HYPERTENSION 04/16/2007  . URI 10/25/2009  . Hyperlipidemia   . Environmental allergies     History   Social History  . Marital Status: Widowed    Spouse Name: N/A    Number of Children: N/A  . Years of Education: N/A   Occupational History  . Not on file.   Social History Main Topics  . Smoking status: Never Smoker   . Smokeless tobacco: Never Used  . Alcohol Use: No  . Drug Use: No  . Sexual Activity: Not on file   Other Topics Concern  . Not on file   Social History Narrative  . No narrative on file    Past Surgical History  Procedure Laterality Date  . Cardiac catheterization      No family history on file.  Allergies  Allergen Reactions  . Eggs Or Egg-Derived Products     Upset stomach     Current Outpatient Prescriptions on File Prior to Visit  Medication Sig Dispense Refill  . ADVAIR DISKUS 500-50 MCG/DOSE AEPB TAKE 1 PUFF BY MOUTH EVERY 12 HOURS  60 each  3  . chlorthalidone (HYGROTON) 25 MG  tablet Take 1 tablet (25 mg total) by mouth daily.  90 tablet  6  . ciprofloxacin (CIPRO) 500 MG tablet Take 1 tablet (500 mg total) by mouth 2 (two) times daily.  14 tablet  0  . cloNIDine (CATAPRES) 0.2 MG tablet Take 1 tablet (0.2 mg total) by mouth 2 (two) times daily.  180 tablet  6  . simvastatin (ZOCOR) 20 MG tablet Take 1 tablet (20 mg total) by mouth at bedtime.  90 tablet  6  . metoprolol (LOPRESSOR) 50 MG tablet Take 1 tablet (50 mg total) by mouth 2 (two) times daily.  180 tablet  3  . [DISCONTINUED] losartan-hydrochlorothiazide (HYZAAR) 100-25 MG per tablet Take 1 tablet by mouth daily.  30 tablet  3   No current facility-administered medications on file prior to visit.    BP 130/86  Pulse 65  Wt 220 lb (99.791 kg)chart    Objective:   Physical Exam  Constitutional: She is oriented to person, place, and time. She appears well-developed and well-nourished.  Neck: Normal range of motion. Neck supple.  Cardiovascular: Normal rate, regular rhythm and normal heart sounds.   Pulmonary/Chest: Effort normal and breath sounds normal.  Abdominal: Soft. Bowel sounds are normal.  Musculoskeletal: Normal range of motion.  Neurological: She is alert and oriented to person, place, and time.  Skin: Skin is warm and dry.  Psychiatric: She has a  normal mood and affect.          Assessment & Plan:  [name] was seen today for follow-up.  Diagnoses and associated orders for this visit:  UTI (urinary tract infection)  Unspecified essential hypertension   Call the office with any questions or concerns. Recheck as scheduled, and as needed.

## 2013-10-28 NOTE — Progress Notes (Signed)
Pre visit review using our clinic review tool, if applicable. No additional management support is needed unless otherwise documented below in the visit note. 

## 2013-10-28 NOTE — Patient Instructions (Signed)
Urinary Tract Infection  Urinary tract infections (UTIs) can develop anywhere along your urinary tract. Your urinary tract is your body's drainage system for removing wastes and extra water. Your urinary tract includes two kidneys, two ureters, a bladder, and a urethra. Your kidneys are a pair of bean-shaped organs. Each kidney is about the size of your fist. They are located below your ribs, one on each side of your spine.  CAUSES  Infections are caused by microbes, which are microscopic organisms, including fungi, viruses, and bacteria. These organisms are so small that they can only be seen through a microscope. Bacteria are the microbes that most commonly cause UTIs.  SYMPTOMS   Symptoms of UTIs may vary by age and gender of the patient and by the location of the infection. Symptoms in young women typically include a frequent and intense urge to urinate and a painful, burning feeling in the bladder or urethra during urination. Older women and men are more likely to be tired, shaky, and weak and have muscle aches and abdominal pain. A fever may mean the infection is in your kidneys. Other symptoms of a kidney infection include pain in your back or sides below the ribs, nausea, and vomiting.  DIAGNOSIS  To diagnose a UTI, your caregiver will ask you about your symptoms. Your caregiver also will ask to provide a urine sample. The urine sample will be tested for bacteria and white blood cells. White blood cells are made by your body to help fight infection.  TREATMENT   Typically, UTIs can be treated with medication. Because most UTIs are caused by a bacterial infection, they usually can be treated with the use of antibiotics. The choice of antibiotic and length of treatment depend on your symptoms and the type of bacteria causing your infection.  HOME CARE INSTRUCTIONS   If you were prescribed antibiotics, take them exactly as your caregiver instructs you. Finish the medication even if you feel better after you  have only taken some of the medication.   Drink enough water and fluids to keep your urine clear or pale yellow.   Avoid caffeine, tea, and carbonated beverages. They tend to irritate your bladder.   Empty your bladder often. Avoid holding urine for long periods of time.   Empty your bladder before and after sexual intercourse.   After a bowel movement, women should cleanse from front to back. Use each tissue only once.  SEEK MEDICAL CARE IF:    You have back pain.   You develop a fever.   Your symptoms do not begin to resolve within 3 days.  SEEK IMMEDIATE MEDICAL CARE IF:    You have severe back pain or lower abdominal pain.   You develop chills.   You have nausea or vomiting.   You have continued burning or discomfort with urination.  MAKE SURE YOU:    Understand these instructions.   Will watch your condition.   Will get help right away if you are not doing well or get worse.  Document Released: 05/21/2005 Document Revised: 02/10/2012 Document Reviewed: 09/19/2011  Exit[employer] Patient Information 2014 [employer].

## 2013-10-29 ENCOUNTER — Telehealth: Payer: Self-pay | Admitting: Internal Medicine

## 2013-10-29 NOTE — Telephone Encounter (Signed)
Relevant patient education mailed to patient.  

## 2013-10-31 ENCOUNTER — Telehealth: Payer: Self-pay | Admitting: Internal Medicine

## 2013-10-31 NOTE — Telephone Encounter (Signed)
Relevant patient education mailed to patient.  

## 2013-11-11 ENCOUNTER — Telehealth: Payer: Self-pay

## 2013-11-11 NOTE — Telephone Encounter (Signed)
Spoke to pt told her there is refills at the pharmacy just needs to call the pharmacy. Pt verbalized understanding.

## 2013-11-11 NOTE — Telephone Encounter (Signed)
Pt is req refill on cloNIDine (CATAPRES) 0.2 MG tablet. She also stated she  needed a refill on another  blood pressure  Medicine but not sure what the name of it was she lost the bottle / pt seem very confused

## 2013-11-15 ENCOUNTER — Telehealth: Payer: Self-pay | Admitting: Internal Medicine

## 2013-11-15 NOTE — Telephone Encounter (Signed)
Anita MessierKathy calls regarding a prescription issue. Msg left on voice mail to call back.

## 2013-11-15 NOTE — Telephone Encounter (Signed)
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 Hilaria Otaold Kathy I am not at liberty to discuss pt's medical issues,  but I will call pt. Olegario MessierKathy verbalized understanding and stated she understood and to call pt after 4:00 pm when she gets off work. Told her okay.  Call[place] pharmacy [place] [facility] [place], asked her if pt has refills on medications. Tobi Bastosnna said yes. Told her please go ahead and refill medications and I will let pt know. Tobi Bastosnna verbalized understanding.

## 2013-11-15 NOTE — Telephone Encounter (Signed)
Spoke to pt told her Rx's are ready at the pharmacy for her. I called the pharmacy and told them to fill them and that I spoke to her daughter [name] and told her Rx's are ready. Pt verbalized understanding and asked which Rx's? Told her all her Rx's were filled, I read all of them to her along with directions. Pt verbalized understanding.

## 2013-11-16 NOTE — Telephone Encounter (Signed)
[facility]. Shonat said yes. Told her okay thank you.

## 2013-11-29 ENCOUNTER — Ambulatory Visit: Payer: Commercial Managed Care - HMO | Admitting: Internal Medicine

## 2013-12-14 ENCOUNTER — Encounter: Payer: Self-pay | Admitting: Internal Medicine

## 2013-12-14 ENCOUNTER — Ambulatory Visit (INDEPENDENT_AMBULATORY_CARE_PROVIDER_SITE_OTHER): Payer: Medicare PPO | Admitting: Internal Medicine

## 2013-12-14 VITALS — BP 138/80 | HR 83 | Temp 98.8°F | Resp 20 | Wt 226.0 lb

## 2013-12-14 DIAGNOSIS — J45909 Unspecified asthma, uncomplicated: Secondary | ICD-10-CM

## 2013-12-14 DIAGNOSIS — R7302 Impaired glucose tolerance (oral): Secondary | ICD-10-CM

## 2013-12-14 DIAGNOSIS — I10 Essential (primary) hypertension: Secondary | ICD-10-CM

## 2013-12-14 DIAGNOSIS — E669 Obesity, unspecified: Secondary | ICD-10-CM | POA: Insufficient documentation

## 2013-12-14 DIAGNOSIS — I2510 Atherosclerotic heart disease of native coronary artery without angina pectoris: Secondary | ICD-10-CM

## 2013-12-14 DIAGNOSIS — R7309 Other abnormal glucose: Secondary | ICD-10-CM

## 2013-12-14 NOTE — Patient Instructions (Signed)
Limit your sodium (Salt) intake    It is important that you exercise regularly, at least 20 minutes 3 to 4 times per week.  If you develop chest pain or shortness of breath seek  medical attention.  You need to lose weight.  Consider a lower calorie diet and regular exercise.  Return in 6 months for follow-up   

## 2013-12-14 NOTE — Progress Notes (Signed)
Subjective:    Patient ID: [name], female    DOB: [birth year], 68 y.o.   MRN: [id number]  HPI 68 year old patient who is seen today for her quarterly followup.  Medical issues include hypertension, CAD, and history of asthma.  Her asthma has been stable.  She remains on maintenance Advair.  No cardiopulmonary complaints.  Past Medical History  Diagnosis Date  . 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 ASTHMA 04/16/2007  . 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 CORONARY ARTERY DISEASE 08/29/2008  . HYPERTENSION 04/16/2007  . URI 10/25/2009  . Hyperlipidemia   . Environmental allergies     History   Social History  . Marital Status: Widowed    Spouse Name: N/A    Number of Children: N/A  . Years of Education: N/A   Occupational History  . Not on file.   Social History Main Topics  . Smoking status: Never Smoker   . Smokeless tobacco: Never Used  . Alcohol Use: No  . Drug Use: No  . Sexual Activity: Not on file   Other Topics Concern  . Not on file   Social History Narrative  . No narrative on file    Past Surgical History  Procedure Laterality Date  . Cardiac catheterization      No family history on file.  Allergies  Allergen Reactions  . Eggs Or Egg-Derived Products     Upset stomach     Current Outpatient Prescriptions on File Prior to Visit  Medication Sig Dispense Refill  . ADVAIR DISKUS 500-50 MCG/DOSE AEPB TAKE 1 PUFF BY MOUTH EVERY 12 HOURS  60 each  3  . chlorthalidone (HYGROTON) 25 MG tablet Take 1 tablet (25 mg total) by mouth daily.  90 tablet  6  . cloNIDine (CATAPRES) 0.2 MG tablet Take 1 tablet (0.2 mg total) by mouth 2 (two) times daily.  180 tablet  6  . simvastatin (ZOCOR) 20 MG tablet Take 1 tablet (20 mg total) by mouth at bedtime.  90 tablet  6  . [DISCONTINUED] losartan-hydrochlorothiazide (HYZAAR) 100-25 MG per tablet Take 1 tablet by mouth daily.  30 tablet  3   No current facility-administered medications on file prior to visit.     BP 138/80  Pulse 83  Temp(Src) 98.8 F (37.1 C) (Oral)  Resp 20  Wt 226 lb (102.513 kg)  SpO2 98%      Review of Systems  Constitutional: Negative.   HENT: Negative for congestion, dental problem, hearing loss, rhinorrhea, sinus pressure, sore throat and tinnitus.   Eyes: Negative for pain, discharge and visual disturbance.  Respiratory: Negative for cough and shortness of breath.   Cardiovascular: Negative for chest pain, palpitations and leg swelling.  Gastrointestinal: Negative for nausea, vomiting, abdominal pain, diarrhea, constipation, blood in stool and abdominal distention.  Genitourinary: Negative for dysuria, urgency, frequency, hematuria, flank pain, vaginal bleeding, vaginal discharge, difficulty urinating, vaginal pain and pelvic pain.  Musculoskeletal: Negative for arthralgias, gait problem and joint swelling.  Skin: Negative for rash.  Neurological: Negative for dizziness, syncope, speech difficulty, weakness, numbness and headaches.  Hematological: Negative for adenopathy.  Psychiatric/Behavioral: Negative for behavioral problems, dysphoric mood and agitation. The patient is not nervous/anxious.        Objective:   Physical Exam  Constitutional: She is oriented to person, place, and time. She appears well-developed and well-nourished.  Obese.  Blood pressure 130/80  HENT:  Head: Normocephalic.  Right Ear: External ear normal.  Left Ear: External ear normal.  Mouth/Throat: Oropharynx is clear and moist.  Edentulous  Eyes: Conjunctivae and EOM are normal. Pupils are equal, round, and reactive to light.  Neck: Normal range of motion. Neck supple. No thyromegaly present.  Cardiovascular: Normal rate, regular rhythm, normal heart sounds and intact distal pulses.   Pulmonary/Chest: Effort normal and breath sounds normal. She has no wheezes. She has no rales.  Abdominal: Soft. Bowel sounds are normal. She exhibits no mass. There is no tenderness.   Musculoskeletal: Normal range of motion.  Lymphadenopathy:    She has no cervical adenopathy.  Neurological: She is alert and oriented to person, place, and time.  Skin: Skin is warm and dry. No rash noted.  Psychiatric: She has a normal mood and affect. Her behavior is normal.          Assessment & Plan:   Hypertension well controlled Asthma stable Obesity CAD stable  CPX 6 months with laboratory update

## 2013-12-14 NOTE — Progress Notes (Signed)
Pre-visit discussion using our clinic review tool. No additional management support is needed unless otherwise documented below in the visit note.  

## 2014-02-17 ENCOUNTER — Emergency Department (HOSPITAL_COMMUNITY): Payer: Medicare PPO

## 2014-02-17 ENCOUNTER — Encounter (HOSPITAL_COMMUNITY): Payer: Self-pay | Admitting: Emergency Medicine

## 2014-02-17 ENCOUNTER — Emergency Department (HOSPITAL_COMMUNITY)
Admission: EM | Admit: 2014-02-17 | Discharge: 2014-02-17 | Disposition: A | Discharge status: Home / Self Care | Payer: Medicare PPO | Attending: Emergency Medicine | Admitting: Emergency Medicine

## 2014-02-17 DIAGNOSIS — Z9889 Other specified postprocedural states: Secondary | ICD-10-CM | POA: Insufficient documentation

## 2014-02-17 DIAGNOSIS — F29 Unspecified psychosis not due to a substance or known physiological condition: Secondary | ICD-10-CM | POA: Insufficient documentation

## 2014-02-17 DIAGNOSIS — T4995XA Adverse effect of unspecified topical agent, initial encounter: Secondary | ICD-10-CM | POA: Insufficient documentation

## 2014-02-17 DIAGNOSIS — Z87440 Personal history of urinary (tract) infections: Secondary | ICD-10-CM | POA: Insufficient documentation

## 2014-02-17 DIAGNOSIS — Z87828 Personal history of other (healed) physical injury and trauma: Secondary | ICD-10-CM | POA: Insufficient documentation

## 2014-02-17 DIAGNOSIS — T783XXA Angioneurotic edema, initial encounter: Secondary | ICD-10-CM | POA: Insufficient documentation

## 2014-02-17 DIAGNOSIS — J45909 Unspecified asthma, uncomplicated: Secondary | ICD-10-CM | POA: Insufficient documentation

## 2014-02-17 DIAGNOSIS — IMO0002 Reserved for concepts with insufficient information to code with codable children: Secondary | ICD-10-CM | POA: Insufficient documentation

## 2014-02-17 DIAGNOSIS — I2510 Atherosclerotic heart disease of native coronary artery without angina pectoris: Secondary | ICD-10-CM | POA: Insufficient documentation

## 2014-02-17 DIAGNOSIS — Z79899 Other long term (current) drug therapy: Secondary | ICD-10-CM | POA: Insufficient documentation

## 2014-02-17 DIAGNOSIS — E785 Hyperlipidemia, unspecified: Secondary | ICD-10-CM | POA: Insufficient documentation

## 2014-02-17 DIAGNOSIS — R410 Disorientation, unspecified: Secondary | ICD-10-CM

## 2014-02-17 LAB — DIFFERENTIAL
Basophils Absolute: 0 10*3/uL (ref 0.0–0.1)
Basophils Relative: 0 % (ref 0–1)
EOS PCT: 2 % (ref 0–5)
Eosinophils Absolute: 0.1 10*3/uL (ref 0.0–0.7)
LYMPHS ABS: 1.9 10*3/uL (ref 0.7–4.0)
LYMPHS PCT: 41 % (ref 12–46)
MONO ABS: 0.3 10*3/uL (ref 0.1–1.0)
MONOS PCT: 6 % (ref 3–12)
Neutro Abs: 2.4 10*3/uL (ref 1.7–7.7)
Neutrophils Relative %: 51 % (ref 43–77)

## 2014-02-17 LAB — I-STAT CHEM 8, ED
BUN: 20 mg/dL (ref 6–23)
CREATININE: 0.9 mg/dL (ref 0.50–1.10)
Calcium, Ion: 1.28 mmol/L (ref 1.13–1.30)
Chloride: 98 mEq/L (ref 96–112)
Glucose, Bld: 97 mg/dL (ref 70–99)
HCT: 39 % (ref 36.0–46.0)
Hemoglobin: 13.3 g/dL (ref 12.0–15.0)
Potassium: 3.2 mEq/L — ABNORMAL LOW (ref 3.7–5.3)
Sodium: 143 mEq/L (ref 137–147)
TCO2: 31 mmol/L (ref 0–100)

## 2014-02-17 LAB — CBC
HCT: 36.6 % (ref 36.0–46.0)
Hemoglobin: 11.5 g/dL — ABNORMAL LOW (ref 12.0–15.0)
MCH: 27.1 pg (ref 26.0–34.0)
MCHC: 31.4 g/dL (ref 30.0–36.0)
MCV: 86.3 fL (ref 78.0–100.0)
Platelets: 180 10*3/uL (ref 150–400)
RBC: 4.24 MIL/uL (ref 3.87–5.11)
RDW: 15.9 % — AB (ref 11.5–15.5)
WBC: 4.7 10*3/uL (ref 4.0–10.5)

## 2014-02-17 LAB — BASIC METABOLIC PANEL
BUN: 21 mg/dL (ref 6–23)
CO2: 32 meq/L (ref 19–32)
Calcium: 9.9 mg/dL (ref 8.4–10.5)
Chloride: 99 mEq/L (ref 96–112)
Creatinine, Ser: 0.82 mg/dL (ref 0.50–1.10)
GFR calc Af Amer: 85 mL/min — ABNORMAL LOW (ref >90)
GFR calc non Af Amer: 73 mL/min — ABNORMAL LOW (ref >90)
Glucose, Bld: 96 mg/dL (ref 70–99)
Potassium: 3.3 mEq/L — ABNORMAL LOW (ref 3.7–5.3)
Sodium: 142 mEq/L (ref 137–147)

## 2014-02-17 LAB — CBG MONITORING, ED: GLUCOSE-CAPILLARY: 96 mg/dL (ref 70–99)

## 2014-02-17 LAB — I-STAT TROPONIN, ED: Troponin i, poc: 0.01 ng/mL (ref 0.00–0.08)

## 2014-02-17 MED ORDER — RANITIDINE HCL 150 MG PO TABS: 150.0000 mg | ORAL_TABLET | Freq: Two times a day (BID) | ORAL | Status: DC

## 2014-02-17 MED ORDER — METHYLPREDNISOLONE SODIUM SUCC 125 MG IJ SOLR
60.0000 mg | Freq: Once | INTRAMUSCULAR | Status: AC
  Administered 2014-02-17: 60 mg via INTRAVENOUS
  Filled 2014-02-17: qty 2

## 2014-02-17 MED ORDER — DIPHENHYDRAMINE HCL 50 MG/ML IJ SOLN
25.0000 mg | Freq: Once | INTRAMUSCULAR | Status: AC
  Administered 2014-02-17: 25 mg via INTRAVENOUS
  Filled 2014-02-17: qty 1

## 2014-02-17 MED ORDER — FAMOTIDINE IN NACL 20-0.9 MG/50ML-% IV SOLN
20.0000 mg | Freq: Once | INTRAVENOUS | Status: AC
  Administered 2014-02-17: 20 mg via INTRAVENOUS
  Filled 2014-02-17: qty 50

## 2014-02-17 MED ORDER — PREDNISONE 20 MG PO TABS: ORAL_TABLET | ORAL | Status: DC

## 2014-02-17 MED ORDER — DIPHENHYDRAMINE HCL 25 MG PO TABS: 25.0000 mg | ORAL_TABLET | Freq: Four times a day (QID) | ORAL | Status: DC

## 2014-02-17 NOTE — ED Notes (Signed)
Patient brought in to the by family ED with C/O Disorientation, weakness and altered speech.  Onset was at 0500 this AM.  Patient went to work and symptoms worsened.  Family picked the patient up at work and brought her to the ED.

## 2014-02-17 NOTE — ED Notes (Signed)
CBG 96. 

## 2014-02-17 NOTE — ED Notes (Signed)
Pt has returned from being out of the department; pt placed back on monitor, continuous pulse oximetry and blood pressure cuff 

## 2014-02-17 NOTE — ED Notes (Signed)
Pt brought back by Amy, NT and placed in gown, on monitor, continuous pulse oximetry and blood pressure cuff; family at bedside

## 2014-02-17 NOTE — ED Notes (Signed)
Pt given crackers with a cup of ice water to drink; pt stated she would then get up after she ate first; family at bedside

## 2014-02-17 NOTE — ED Notes (Addendum)
Pt states woke up with upper lip swelling.  No shortness of breath.  This morning she had to have help getting out of [name] and concern for a stroke.  Work reported confusion.  Pt knows year, where she is, dob.  No pain.  LSN 2000 yesterday

## 2014-02-17 NOTE — ED Notes (Signed)
EKG ws performed in triage by technician [phone]#[id number] at 1126 and signed off by Denton LankSteinl, MD

## 2014-02-17 NOTE — Discharge Instructions (Signed)
Take prednisone, benadryl, and zantac as directed for 5 days, which are for your lip swelling related to your angioedema. See the allergist to get further evaluation and treatment for this. See your primary care doctor in 3 days regarding this visit today. Watch for signs of slurred speech, one sided weakness, confusion, or changes in mental status. If any of these occur, return to the emergency room immediately.   Angioedema Angioedema is sudden puffiness (swelling), often of the skin. It can happen:  On your face or privates (genitals).  In your belly (abdomen) or other body parts. It usually happens quickly and gets better in 1 or 2 days. It often starts at night and is found when you wake up. You may get red, itchy patches of skin (hives). Attacks can be dangerous if your breathing passages get puffy. The condition may happen only once, or it can come back at random times. It may happen for several years before it goes away for good. HOME CARE  Only take medicines as told by your doctor.  Always carry your emergency allergy medicines with you.  Wear a medical bracelet as told by your doctor.  Avoid things that you know will cause attacks (triggers). GET HELP IF:  You have another attack.  Your attacks happen more often or get worse.  The condition was passed to you by your parents and you want to have children. GET HELP RIGHT AWAY IF:   Your mouth, tongue, or lips are very puffy.  You have trouble breathing.  You have trouble swallowing.  You pass out (faint). MAKE SURE YOU:   Understand these instructions.  Will watch your condition.  Will get help right away if you are not doing well or get worse. Document Released: 07/30/2009 Document Revised: 06/01/2013 Document Reviewed: 04/04/2013 [facility] Patient Information 2015 [place], [place]. This information is not intended to replace advice given to you by your health care provider. Make sure you discuss any questions you  have with your health care provider.  Confusion Confusion is the inability to think with your usual speed or clarity. Confusion may come on quickly or slowly over time. How quickly the confusion comes on depends on the cause. Confusion can be due to any number of causes. CAUSES   Concussion, head injury, or head trauma.  Seizures.  Stroke.  Fever.  Senility.  Heightened emotional states like rage or terror.  Mental illness in which the person loses the ability to determine what is real and what is not (hallucinations).  Infections.  Toxic effects from alcohol, drugs, or prescription medicines.  Dehydration and an imbalance of salts in the body (electrolytes).  Lack of sleep.  Low blood sugar (diabetes).  Low levels of oxygen (for example from chronic lung disorders).  Drug interactions or other medication side effects.  Nutritional deficiencies, especially niacin, thiamine, vitamin C, or vitamin B.  Sudden drop in body temperature (hypothermia).  Illness in the elderly. Constipation can result in confusion. An elderly person who is hospitalized may become confused due to change in daily routine. SYMPTOMS  People often describe their thinking as cloudy or unclear when they are confused. Confusion can also include feeling disoriented. That means you are unaware of where or who you are. You may also not know what the date or time is. If confused, you may also have difficulty paying attention, remembering and making decisions. Some people also act aggressively when they are confused.  DIAGNOSIS  The medical evaluation of confusion may include:  Blood and urine tests.  X-rays.  Brain and nervous system tests.  Analyzing your brain waves (electroencphalogram or EEG).  A special X-ray (MRI) of your head or other special studies. Your physician will ask questions such as:  Do you get days and nights mixed up?  Are you awake during regular sleep times?  Do you have  trouble recognizing people?  Do you know where you are?  Do you know the date and time?  Does the confusion come and go?  Is the confusion quickly getting worse?  Has there been a recent illness?  Has there been a recent head injury?  Are you diabetic?  Do you have a lung disorder?  What medication are you taking?  Have you taken drugs or alcohol? TREATMENT  An admission to the hospital may not be needed, but a confused person should not be left alone. Stay with a family member or friend until the confusion clears. Avoid alcohol, pain relievers or sedative drugs until you have fully recovered. Do not drive until your caregiver says it is okay. HOME CARE INSTRUCTIONS What family and friends can do:  To find out if someone is confused ask him or her their name, age, and the date. If the person is unsure or answers incorrectly, he or she is confused.  Always introduce yourself, no matter how well the person knows you.  Often remind the person of his or her location.  Place a calendar and clock near the confused person.  Talk about current events and plans for the day.  Try to keep the environment calm, quiet and peaceful.  Make sure the patient keeps follow up appointments with their physician. PREVENTION  Ways to prevent confusion:  Avoid alcohol.  Eat a balanced diet.  Get enough sleep.  Do not become isolated. Spend time with other people and make plans for your days.  Keep careful watch on your blood sugar levels if you are diabetic. SEEK IMMEDIATE MEDICAL CARE IF:   You develop severe headaches, repeated vomiting, seizures, blackouts or slurred speech.  There is increasing confusion, weakness, numbness, restlessness or personality changes.  You develop a loss of balance, have marked dizziness, feel uncoordinated or fall.  You have delusions, hallucinations or develop severe anxiety.  Your family members think you need to be rechecked. Document Released:  09/18/2004 Document Revised: 11/03/2011 Document Reviewed: 05/16/2008 [facility] Patient Information 2015 [place] Center[place], [place]. This information is not intended to replace advice given to you by your health care provider. Make sure you discuss any questions you have with your health care provider.

## 2014-02-17 NOTE — ED Provider Notes (Signed)
CSN: [id number]     Arrival date & time 02/17/14  1101 History   First MD Initiated Contact with Patient 02/17/14 1133     Chief Complaint  Patient presents with  . Oral Swelling     (Consider location/radiation/quality/duration/timing/severity/associated sxs/prior Treatment) HPI Comments: [name] is a 68 y.o. Female with a PMHx of HTN, CVA, and angioedema who is brought in by her daughter, presents to the ED today complaining of upper lip swelling, as well as weakness and confusion reported by her coworkers. She states she awoke this morning with upper lip swelling, which progressively got more swollen throughout the morning. Has had this happen in the past, but does not recall what the source/allergen was. Did not eat or drink anything prior to noticing the swelling. Denies swelling in her tongue/throat, SOB, stridor, wheezing, or drooling. No suspicious food intake, no new medications. Unsure of medications that she takes, but upon review she thinks she takes Hyzaar and Chlorthalidone.  Daughter states that she was called to pick her up from work this morning due to concerns of confusion and weakness noticed by her coworkers. Daughter states she saw her last at 8pm last night, and pt was acting normally. When she picked her up from work she noticed her mother needed assistance to get to the car, which she usually does not, and she noticed some slight slurring of words. Otherwise the pt was acting normally. Pt endorses some fatigue but states she always feels tired. Pt states she does not feel unilateral weakness, denies numbness/paresthesias. Denies fevers, chills, vision disturbance, CP, HA, SOB, abd pain, N/V/D, myalgias, arthralgias, hematuria, dysuria, or confusion. Denies having any pain. States she awoke this morning only noticing that her lip was swollen, did not feel any different otherwise.   The history is provided by the patient. No language interpreter was used.    Past  Medical History  Diagnosis Date  . 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 ASTHMA 04/16/2007  . 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 CORONARY ARTERY DISEASE 08/29/2008  . HYPERTENSION 04/16/2007  . URI 10/25/2009  . Hyperlipidemia   . Environmental allergies    Past Surgical History  Procedure Laterality Date  . Cardiac catheterization     No family history on file. History  Substance Use Topics  . Smoking status: Never Smoker   . Smokeless tobacco: Never Used  . Alcohol Use: No   OB History   Grav Para Term Preterm Abortions TAB SAB Ect Mult Living                 Review of Systems  Constitutional: Positive for fatigue. Negative for fever, chills and activity change.  HENT: Positive for facial swelling (lip). Negative for congestion, ear pain, hearing loss, rhinorrhea, sore throat, trouble swallowing and voice change.   Eyes: Negative for pain and visual disturbance.  Respiratory: Negative for cough, choking, chest tightness, shortness of breath, wheezing and stridor.   Cardiovascular: Negative for chest pain, palpitations and leg swelling.  Gastrointestinal: Negative for nausea, vomiting, abdominal pain, diarrhea, constipation and blood in stool.  Genitourinary: Negative for dysuria, urgency, hematuria, flank pain, decreased urine volume and difficulty urinating.  Musculoskeletal: Negative for arthralgias, back pain, gait problem, joint swelling, myalgias, neck pain and neck stiffness.  Skin: Negative for rash.  Neurological: Positive for speech difficulty (per daughter, slight slurring of words PTA). Negative for dizziness, syncope, facial asymmetry, weakness, light-headedness, numbness and headaches.  Psychiatric/Behavioral: Positive for confusion (per coworkers).  10 Systems  reviewed and are negative for acute change except as noted in the HPI.     Allergies  Eggs or egg-derived products  Home Medications   Prior to Admission medications   Medication Sig Start Date End Date  Taking? Authorizing Provider  B Complex-C (B-COMPLEX WITH VITAMIN C) tablet Take 1 tablet by mouth daily.   Yes Historical Provider, MD  chlorthalidone (HYGROTON) 25 MG tablet Take 1 tablet (25 mg total) by mouth daily. 07/01/13 07/01/14 Yes Gordy SaversPeter F Kwiatkowski, MD  cloNIDine (CATAPRES) 0.2 MG tablet Take 1 tablet (0.2 mg total) by mouth 2 (two) times daily. 07/01/13  Yes Gordy SaversPeter F Kwiatkowski, MD  Fluticasone-Salmeterol (ADVAIR) 500-50 MCG/DOSE AEPB Inhale 1 puff into the lungs every 12 (twelve) hours.   Yes Historical Provider, MD  Multiple Vitamins-Minerals (ALIVE WOMENS 50+) TABS Take 1 tablet by mouth daily.   Yes Historical Provider, MD  simvastatin (ZOCOR) 20 MG tablet Take 1 tablet (20 mg total) by mouth at bedtime. 07/01/13  Yes Gordy SaversPeter F Kwiatkowski, MD  diphenhydrAMINE (BENADRYL) 25 MG tablet Take 1 tablet (25 mg total) by mouth every 6 (six) hours. 02/17/14   Mercedes Strupp Camprubi-Soms, PA-C  predniSONE (DELTASONE) 20 MG tablet 3 tabs po daily x 5 days 02/17/14   Donnita FallsMercedes Strupp Camprubi-Soms, PA-C  ranitidine (ZANTAC) 150 MG tablet Take 1 tablet (150 mg total) by mouth 2 (two) times daily. 02/17/14   Mercedes Strupp Camprubi-Soms, PA-C   BP 175/63  Pulse 65  Temp(Src) 98.2 F (36.8 C) (Oral)  Resp 16  Ht 5\' 3"  (1.6 m)  Wt 233 lb (105.688 kg)  BMI 41.28 kg/m2  SpO2 97% Physical Exam  Nursing note and vitals reviewed. Constitutional: She is oriented to person, place, and time. Vital signs are normal. She appears well-developed and well-nourished. She is cooperative. She is easily aroused. She does not appear ill. No distress.  WD/WN in NAD, appears fatigued but arousable and cooperative with exam  HENT:  Head: Normocephalic and atraumatic.  Nose: Nose normal.  Mouth/Throat: Uvula is midline, oropharynx is clear and moist and mucous membranes are normal. Abnormal dentition (no upper teeth).  Uvula midline, MMM, no buccal mucosal swelling or uvular swelling. Poor dentitia. Oropharynx  clear and moist. No tongue deviation with protrusion. Upper lip uniformly swollen, non-TTP, no erythema.   Eyes: Conjunctivae are normal. Pupils are equal, round, and reactive to light. No scleral icterus. Right eye exhibits nystagmus. Right eye exhibits normal extraocular motion. Left eye exhibits nystagmus. Left eye exhibits normal extraocular motion.  B/L horizontal nystagmus with L and R gaze, chronic per patient and daughter. EOMI, conjunctiva non-injected, PERRL.   Neck: Normal range of motion and phonation normal. Neck supple. No JVD present. No rigidity.  No nuchal rigidity or JVD. Phonation WNL with no stridor.  Cardiovascular: Normal rate, regular rhythm, normal heart sounds and intact distal pulses.  Exam reveals no friction rub.   No murmur heard. Pulmonary/Chest: Effort normal and breath sounds normal. No stridor. No respiratory distress. She has no decreased breath sounds. She has no wheezes. She has no rales.  Abdominal: Soft. Normal appearance and bowel sounds are normal. She exhibits no distension. There is no tenderness. There is no rigidity, no rebound and no guarding.  Musculoskeletal: Normal range of motion.  Strength 5/5 in b/l extremities, ROM intact in all extremities.  Neurological: She is oriented to person, place, and time and easily aroused. She has normal strength. No cranial nerve deficit or sensory deficit. GCS eye subscore is 4.  GCS verbal subscore is 5. GCS motor subscore is 6.  CNII-XI grossly intact, no focal neuro deficits. GCS 15. A&O x4. Sensation grossly intact in all extremities. Strength 5/5 in all extremities. No facial asymmetry or droop.  Skin: Skin is warm and dry.  Psychiatric: She has a normal mood and affect. Her speech is normal and behavior is normal. Cognition and memory are normal.    ED Course  Procedures (including critical care time) Labs Review Labs Reviewed  CBC - Abnormal; Notable for the following:    Hemoglobin 11.5 (*)    RDW 15.9  (*)    All other components within normal limits  BASIC METABOLIC PANEL - Abnormal; Notable for the following:    Potassium 3.3 (*)    GFR calc non Af Amer 73 (*)    GFR calc Af Amer 85 (*)    All other components within normal limits  I-STAT CHEM 8, ED - Abnormal; Notable for the following:    Potassium 3.2 (*)    All other components within normal limits  DIFFERENTIAL  URINALYSIS, ROUTINE W REFLEX MICROSCOPIC  CBG MONITORING, ED  I-STAT TROPOININ, ED    Imaging Review Ct Head Wo Contrast  02/17/2014   CLINICAL DATA:  Disorientation.  Weakness.  Slurred speech.  EXAM: CT HEAD WITHOUT CONTRAST  TECHNIQUE: Contiguous axial images were obtained from the base of the skull through the vertex without intravenous contrast.  COMPARISON:  10/25/2013  FINDINGS: There are chronic ischemic changes affecting the pons. No focal cerebellar insult. The cerebral hemispheres show advanced confluent low-density affecting the white matter consistent with chronic small vessel disease. No sign of acute infarction, mass lesion, hemorrhage, hydrocephalus or extra-axial collection. No calvarial abnormality. Sinuses are clear.  IMPRESSION: No identifiable acute insult. Extensive chronic ischemic changes throughout the brainstem and cerebral hemispheric white matter.   Electronically Signed   By: Paulina FusiMark  Shogry M.D.   On: 02/17/2014 12:42     EKG Interpretation None      MDM   Final diagnoses:  Angioedema of lips, initial encounter  Confusion with non-focal neuro exam    Denyse DagoGeraldine J Crookston is a 68 y.o. female w/ a PMHx of angioedema and CVA presenting with upper lip swelling x 5 hrs and reported confusion/weakness this morning at work. Neuro exam unremarkable at this time, although pt appears fatigued. Due to hx of CVA, suspect possible TIA but will obtain CVA panel and labs. Upper lip swelling likely angioedema given hx and no provoking factor this morning. Will give solumedrol, pepcid, and benadryl now. Will  reassess as labs return.  2:39 PM Labs reveal baseline hypokalemia at 3.2. All others WNL. CT unremarkable for acute changes. Pt states her lip feels less swollen, appears minimal smaller. Will d/c with prednisone, benadryl, and zantac x 5 days to help with the remaining swelling, as well as f/up with allergist to further eval cause of this swelling. Pt remains A&Ox4, although appears fatigued which could be result of benadryl given. Given hx of CVA, this morning's change in baseline could represent TIA, given that pt is back to baseline with no neuro deficits at this time.   4:34 PM Pt ambulated well, steady gait, and was able to feed herself with no issues. Her three children agree that she is back to baseline. Patient is to be discharged with recommendation to follow up with PCP in regards to today's hospital visit. Pts symptoms unlikely to be of neurologic etiology and there were no focal neurologic  findings throughout the patients stay in the ED. Labs and imaging reviewed again prior to dc. CT with no acute cranial abnormalities. Pt and family has been advised return to the ED if stroke like symptoms present again including slurred speech, severe acute HA, ataxia, weakness, or speech disturbance. Discussed results with patient & answered questions. Pt appears reliable for follow up and is agreeable to discharge.   BP 175/63  Pulse 65  Temp(Src) 98.2 F (36.8 C) (Oral)  Resp 16  Ht 5\' 3"  (1.6 m)  Wt 233 lb (105.688 kg)  BMI 41.28 kg/m2  SpO2 97%   [employer], PA-C 02/17/14 1634

## 2014-02-20 NOTE — ED Provider Notes (Signed)
Medical screening examination/treatment/procedure(s) were conducted as a shared visit with non-physician practitioner(s) and myself.  I personally evaluated the patient during the encounter.   EKG Interpretation   Date/Time:  Friday February 17 2014 11:26:28 EDT Ventricular Rate:  61 PR Interval:  218 QRS Duration: 100 QT Interval:  434 QTC Calculation: 436 R Axis:   45 Text Interpretation:  Sinus rhythm with 1st degree A-V block Otherwise  normal ECG ED PHYSICIAN INTERPRETATION AVAILABLE IN [facility]  Confirmed by TEST, Record ([id number]) on 02/19/2014 8:28:08 AM      Pt c/o upper lip swelling. Hx angioedema. No longer on ace inhib. Upper lip swelling on exam.  No tongue or throat swelling.   Suzi RootsKevin E Steinl, MD 02/20/14 [phone]

## 2014-03-28 ENCOUNTER — Encounter: Payer: Self-pay | Admitting: Internal Medicine

## 2014-03-28 ENCOUNTER — Ambulatory Visit (INDEPENDENT_AMBULATORY_CARE_PROVIDER_SITE_OTHER): Payer: Medicare PPO | Admitting: Internal Medicine

## 2014-03-28 VITALS — BP 160/90 | HR 71 | Temp 98.2°F | Resp 20 | Ht 63.0 in | Wt 234.0 lb

## 2014-03-28 DIAGNOSIS — R011 Cardiac murmur, unspecified: Secondary | ICD-10-CM

## 2014-03-28 DIAGNOSIS — I2510 Atherosclerotic heart disease of native coronary artery without angina pectoris: Secondary | ICD-10-CM

## 2014-03-28 DIAGNOSIS — E669 Obesity, unspecified: Secondary | ICD-10-CM

## 2014-03-28 DIAGNOSIS — I10 Essential (primary) hypertension: Secondary | ICD-10-CM

## 2014-03-28 DIAGNOSIS — Z8679 Personal history of other diseases of the circulatory system: Secondary | ICD-10-CM

## 2014-03-28 DIAGNOSIS — R7302 Impaired glucose tolerance (oral): Secondary | ICD-10-CM

## 2014-03-28 DIAGNOSIS — T783XXA Angioneurotic edema, initial encounter: Secondary | ICD-10-CM

## 2014-03-28 MED ORDER — AMLODIPINE BESYLATE 5 MG PO TABS: 5.0000 mg | ORAL_TABLET | Freq: Every day | ORAL | Status: DC

## 2014-03-28 NOTE — Patient Instructions (Signed)
Limit your sodium (Salt) intake  You need to lose weight.  Consider a lower calorie diet and regular exercise.  Please check your blood pressure on a regular basis.  If it is consistently greater than 150/90, please make an office appointment.  Return in 3 months for follow-up

## 2014-03-28 NOTE — Progress Notes (Signed)
Pre visit review using our clinic review tool, if applicable. No additional management support is needed unless otherwise documented below in the visit note. 

## 2014-03-28 NOTE — Progress Notes (Signed)
Subjective:    Patient ID: [name], female    DOB: December 04, 1945, 68 y.o.   MRN: [id number]  HPI  68 year old patient who is seen today for followup.  She has treated hypertension and a history of coronary artery disease.  Since her last visit here.  She was seen in the ED for recurrent angioedema.  She is unclear of her medications. Family has been concerned of impaired memory  MMSE performed today with a score of 29 out of 30  Past Medical History  Diagnosis Date  . 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 ASTHMA 04/16/2007  . 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 CORONARY ARTERY DISEASE 08/29/2008  . HYPERTENSION 04/16/2007  . URI 10/25/2009  . Hyperlipidemia   . Environmental allergies     History   Social History  . Marital Status: Widowed    Spouse Name: N/A    Number of Children: N/A  . Years of Education: N/A   Occupational History  . Not on file.   Social History Main Topics  . Smoking status: Never Smoker   . Smokeless tobacco: Never Used  . Alcohol Use: No  . Drug Use: No  . Sexual Activity: Not on file   Other Topics Concern  . Not on file   Social History Narrative  . No narrative on file    Past Surgical History  Procedure Laterality Date  . Cardiac catheterization      No family history on file.  Allergies  Allergen Reactions  . Eggs Or Egg-Derived Products     Upset stomach     Current Outpatient Prescriptions on File Prior to Visit  Medication Sig Dispense Refill  . B Complex-C (B-COMPLEX WITH VITAMIN C) tablet Take 1 tablet by mouth daily.      . chlorthalidone (HYGROTON) 25 MG tablet Take 1 tablet (25 mg total) by mouth daily.  90 tablet  6  . cloNIDine (CATAPRES) 0.2 MG tablet Take 1 tablet (0.2 mg total) by mouth 2 (two) times daily.  180 tablet  6  . diphenhydrAMINE (BENADRYL) 25 MG tablet Take 1 tablet (25 mg total) by mouth every 6 (six) hours.  20 tablet  0  . Fluticasone-Salmeterol (ADVAIR) 500-50 MCG/DOSE AEPB Inhale 1 puff into  the lungs every 12 (twelve) hours.      . Multiple Vitamins-Minerals (ALIVE WOMENS 50+) TABS Take 1 tablet by mouth daily.      . predniSONE (DELTASONE) 20 MG tablet 3 tabs po daily x 5 days  15 tablet  0  . ranitidine (ZANTAC) 150 MG tablet Take 1 tablet (150 mg total) by mouth 2 (two) times daily.  60 tablet  0  . simvastatin (ZOCOR) 20 MG tablet Take 1 tablet (20 mg total) by mouth at bedtime.  90 tablet  6  . [DISCONTINUED] losartan-hydrochlorothiazide (HYZAAR) 100-25 MG per tablet Take 1 tablet by mouth daily.  30 tablet  3   No current facility-administered medications on file prior to visit.    BP 160/90  Pulse 71  Temp(Src) 98.2 F (36.8 C) (Oral)  Resp 20  Ht 5\' 3"  (1.6 m)  Wt 234 lb (106.142 kg)  BMI 41.46 kg/m2  SpO2 97%     Review of Systems  Constitutional: Negative.   HENT: Negative for congestion, dental problem, hearing loss, rhinorrhea, sinus pressure, sore throat and tinnitus.   Eyes: Negative for pain, discharge and visual disturbance.  Respiratory: Negative for cough and shortness of breath.   Cardiovascular: Negative for  chest pain, palpitations and leg swelling.  Gastrointestinal: Negative for nausea, vomiting, abdominal pain, diarrhea, constipation, blood in stool and abdominal distention.  Genitourinary: Negative for dysuria, urgency, frequency, hematuria, flank pain, vaginal bleeding, vaginal discharge, difficulty urinating, vaginal pain and pelvic pain.  Musculoskeletal: Negative for arthralgias, gait problem and joint swelling.  Skin: Negative for rash.  Neurological: Negative for dizziness, syncope, speech difficulty, weakness, numbness and headaches.  Hematological: Negative for adenopathy.  Psychiatric/Behavioral: Negative for behavioral problems, dysphoric mood and agitation. The patient is not nervous/anxious.        Objective:   Physical Exam  Constitutional: She is oriented to person, place, and time. She appears well-developed and  well-nourished.  HENT:  Head: Normocephalic.  Right Ear: External ear normal.  Left Ear: External ear normal.  Mouth/Throat: Oropharynx is clear and moist.  Eyes: Conjunctivae and EOM are normal. Pupils are equal, round, and reactive to light.  Neck: Normal range of motion. Neck supple. No thyromegaly present.  Cardiovascular: Normal rate, regular rhythm and intact distal pulses.   Murmur heard. Grade 3 over 6 systolic murmur with transmission to the carotid distribution bilaterally  Pulmonary/Chest: Effort normal and breath sounds normal.  Abdominal: Soft. Bowel sounds are normal. She exhibits no mass. There is no tenderness.  Musculoskeletal: Normal range of motion. She exhibits edema.  Lymphadenopathy:    She has no cervical adenopathy.  Neurological: She is alert and oriented to person, place, and time.  Skin: Skin is warm and dry. No rash noted.  Psychiatric: She has a normal mood and affect. Her behavior is normal.          Assessment & Plan:   Hypertension.  Suboptimal control with repeat blood pressure 160/80 Coronary artery disease.  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  We'll check a 2-D echocardiogram to rule out significant past  CPX 3 months Compliance with medication.  Encouraged

## 2014-04-04 ENCOUNTER — Other Ambulatory Visit (HOSPITAL_COMMUNITY): Payer: Self-pay

## 2014-06-14 ENCOUNTER — Encounter: Payer: Commercial Managed Care - HMO | Admitting: Internal Medicine

## 2014-07-05 ENCOUNTER — Ambulatory Visit
Admission: RE | Admit: 2014-07-05 | Discharge: 2014-07-05 | Disposition: A | Discharge status: Home / Self Care | Payer: Commercial Managed Care - HMO | Source: Ambulatory Visit

## 2014-07-05 ENCOUNTER — Other Ambulatory Visit: Payer: Self-pay

## 2014-07-05 DIAGNOSIS — Z1231 Encounter for screening mammogram for malignant neoplasm of breast: Secondary | ICD-10-CM

## 2014-10-02 DIAGNOSIS — I639 Cerebral infarction, unspecified: Secondary | ICD-10-CM | POA: Insufficient documentation

## 2014-10-02 DIAGNOSIS — Z91199 Patient's noncompliance with other medical treatment and regimen due to unspecified reason: Secondary | ICD-10-CM | POA: Insufficient documentation

## 2014-10-02 DIAGNOSIS — J45909 Unspecified asthma, uncomplicated: Secondary | ICD-10-CM | POA: Insufficient documentation

## 2014-10-02 DIAGNOSIS — I10 Essential (primary) hypertension: Secondary | ICD-10-CM | POA: Insufficient documentation

## 2014-10-02 DIAGNOSIS — H548 Legal blindness, as defined in USA: Secondary | ICD-10-CM | POA: Insufficient documentation

## 2014-10-02 DIAGNOSIS — E785 Hyperlipidemia, unspecified: Secondary | ICD-10-CM | POA: Insufficient documentation

## 2014-10-04 DIAGNOSIS — G9340 Encephalopathy, unspecified: Secondary | ICD-10-CM | POA: Insufficient documentation

## 2014-10-06 DIAGNOSIS — N390 Urinary tract infection, site not specified: Secondary | ICD-10-CM | POA: Insufficient documentation

## 2014-10-16 ENCOUNTER — Encounter: Payer: Self-pay | Admitting: Gastroenterology

## 2014-11-15 ENCOUNTER — Emergency Department (HOSPITAL_COMMUNITY): Payer: Medicare PPO

## 2014-11-15 ENCOUNTER — Emergency Department (HOSPITAL_COMMUNITY)
Admission: EM | Admit: 2014-11-15 | Discharge: 2014-11-15 | Disposition: A | Discharge status: Home / Self Care | Payer: Medicare PPO | Attending: Emergency Medicine | Admitting: Emergency Medicine

## 2014-11-15 ENCOUNTER — Encounter (HOSPITAL_COMMUNITY): Payer: Self-pay

## 2014-11-15 DIAGNOSIS — R52 Pain, unspecified: Secondary | ICD-10-CM

## 2014-11-15 DIAGNOSIS — Z8673 Personal history of transient ischemic attack (TIA), and cerebral infarction without residual deficits: Secondary | ICD-10-CM | POA: Diagnosis not present

## 2014-11-15 DIAGNOSIS — E785 Hyperlipidemia, unspecified: Secondary | ICD-10-CM | POA: Diagnosis not present

## 2014-11-15 DIAGNOSIS — Y9389 Activity, other specified: Secondary | ICD-10-CM | POA: Diagnosis not present

## 2014-11-15 DIAGNOSIS — I2510 Atherosclerotic heart disease of native coronary artery without angina pectoris: Secondary | ICD-10-CM | POA: Insufficient documentation

## 2014-11-15 DIAGNOSIS — I10 Essential (primary) hypertension: Secondary | ICD-10-CM | POA: Diagnosis not present

## 2014-11-15 DIAGNOSIS — W06XXXA Fall from bed, initial encounter: Secondary | ICD-10-CM | POA: Insufficient documentation

## 2014-11-15 DIAGNOSIS — S79912A Unspecified injury of left hip, initial encounter: Secondary | ICD-10-CM | POA: Insufficient documentation

## 2014-11-15 DIAGNOSIS — Z7951 Long term (current) use of inhaled steroids: Secondary | ICD-10-CM | POA: Insufficient documentation

## 2014-11-15 DIAGNOSIS — Z7982 Long term (current) use of aspirin: Secondary | ICD-10-CM | POA: Insufficient documentation

## 2014-11-15 DIAGNOSIS — Y929 Unspecified place or not applicable: Secondary | ICD-10-CM | POA: Insufficient documentation

## 2014-11-15 DIAGNOSIS — Z79899 Other long term (current) drug therapy: Secondary | ICD-10-CM | POA: Diagnosis not present

## 2014-11-15 DIAGNOSIS — Z9889 Other specified postprocedural states: Secondary | ICD-10-CM | POA: Insufficient documentation

## 2014-11-15 DIAGNOSIS — M533 Sacrococcygeal disorders, not elsewhere classified: Secondary | ICD-10-CM

## 2014-11-15 DIAGNOSIS — J45909 Unspecified asthma, uncomplicated: Secondary | ICD-10-CM | POA: Insufficient documentation

## 2014-11-15 DIAGNOSIS — Y998 Other external cause status: Secondary | ICD-10-CM | POA: Insufficient documentation

## 2014-11-15 MED ORDER — HYDROCODONE-ACETAMINOPHEN 5-325 MG PO TABS
1.0000 | ORAL_TABLET | Freq: Once | ORAL | Status: AC
  Administered 2014-11-15: 1 via ORAL
  Filled 2014-11-15: qty 1

## 2014-11-15 MED ORDER — NAPROXEN 500 MG PO TABS: 500.0000 mg | ORAL_TABLET | Freq: Two times a day (BID) | ORAL | Status: DC

## 2014-11-15 MED ORDER — POLYETHYLENE GLYCOL 3350 17 GM/SCOOP PO POWD: 17.0000 g | Freq: Every day | ORAL | Status: DC

## 2014-11-15 MED ORDER — METOPROLOL TARTRATE 25 MG PO TABS
50.0000 mg | ORAL_TABLET | ORAL | Status: AC
  Administered 2014-11-15: 50 mg via ORAL
  Filled 2014-11-15: qty 2

## 2014-11-15 MED ORDER — AMLODIPINE BESYLATE 5 MG PO TABS
10.0000 mg | ORAL_TABLET | ORAL | Status: AC
  Administered 2014-11-15: 10 mg via ORAL
  Filled 2014-11-15: qty 2

## 2014-11-15 MED ORDER — HYDRALAZINE HCL 50 MG PO TABS
50.0000 mg | ORAL_TABLET | ORAL | Status: AC
  Administered 2014-11-15: 50 mg via ORAL
  Filled 2014-11-15: qty 1

## 2014-11-15 MED ORDER — NAPROXEN 250 MG PO TABS
500.0000 mg | ORAL_TABLET | Freq: Once | ORAL | Status: AC
  Administered 2014-11-15: 500 mg via ORAL
  Filled 2014-11-15: qty 2

## 2014-11-15 NOTE — ED Notes (Signed)
Patient transported to X-ray 

## 2014-11-15 NOTE — ED Provider Notes (Signed)
CSN: [id number]     Arrival date & time 11/15/14  1021 History   First MD Initiated Contact with Patient 11/15/14 1021     Chief Complaint  Patient presents with  . Hip Pain   (Consider location/radiation/quality/duration/timing/severity/associated sxs/prior Treatment) HPI [name] is a 69 year old female presenting from nursing facility with left hip pain. She states she was sitting on the side of the bed this morning and slid to the floor. She is a history of stroke in the past with left-sided weakness and is supposed to have assistance with dressing and transferring and she did not have this help. She denies any new weakness or changes in mental status that can treated to sliding to the floor. She denies any pain or injury after sliding to the floor, however reports ongoing intermittent left hip pain times several weeks. She denies any injury or new unilateral weakness, or swelling.     Past Medical History  Diagnosis Date  . 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 ASTHMA 04/16/2007  . 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 CORONARY ARTERY DISEASE 08/29/2008  . HYPERTENSION 04/16/2007  . URI 10/25/2009  . Hyperlipidemia   . Environmental allergies    Past Surgical History  Procedure Laterality Date  . Cardiac catheterization     No family history on file. History  Substance Use Topics  . Smoking status: Never Smoker   . Smokeless tobacco: Never Used  . Alcohol Use: No   OB History    No data available     Review of Systems  Constitutional: Negative for fever and chills.  HENT: Negative for sore throat.   Eyes: Negative for visual disturbance.  Respiratory: Negative for cough and shortness of breath.   Cardiovascular: Negative for chest pain and leg swelling.  Gastrointestinal: Negative for nausea, vomiting and diarrhea.  Genitourinary: Negative for dysuria.  Musculoskeletal: Positive for myalgias and arthralgias.  Skin: Negative for rash.  Neurological: Negative for  weakness, numbness and headaches.      Allergies  Lisinopril; Zolpidem tartrate; and Eggs or egg-derived products  Home Medications   Prior to Admission medications   Medication Sig Start Date End Date Taking? Authorizing Provider  acetaminophen (TYLENOL) 325 MG tablet Take 650 mg by mouth every 4 (four) hours as needed for mild pain.   Yes Historical Provider, MD  albuterol (PROVENTIL HFA;VENTOLIN HFA) 108 (90 BASE) MCG/ACT inhaler Inhale 2 puffs into the lungs every 6 (six) hours as needed for wheezing or shortness of breath.   Yes Historical Provider, MD  amLODipine (NORVASC) 10 MG tablet Take 10 mg by mouth daily.   Yes Historical Provider, MD  aspirin 325 MG tablet Take 325 mg by mouth daily.   Yes Historical Provider, MD  atorvastatin (LIPITOR) 80 MG tablet Take 80 mg by mouth daily.   Yes Historical Provider, MD  Fluticasone-Salmeterol (ADVAIR) 500-50 MCG/DOSE AEPB Inhale 1 puff into the lungs every 12 (twelve) hours.   Yes Historical Provider, MD  hydrALAZINE (APRESOLINE) 50 MG tablet Take 50 mg by mouth 3 (three) times daily.   Yes Historical Provider, MD  loratadine (CLARITIN) 10 MG tablet Take 10 mg by mouth daily.   Yes Historical Provider, MD  Melatonin 3 MG TABS Take 3 mg by mouth at bedtime as needed (insomnia).   Yes Historical Provider, MD  metoprolol (LOPRESSOR) 50 MG tablet Take 50 mg by mouth 2 (two) times daily.   Yes Historical Provider, MD  Multiple Vitamins-Minerals (ALIVE WOMENS 50+) TABS Take 1 tablet  by mouth daily.   Yes Historical Provider, MD  nystatin (MYCOSTATIN/NYSTOP) 100000 UNIT/GM POWD Apply 1 g topically every 8 (eight) hours.   Yes Historical Provider, MD  amLODipine (NORVASC) 5 MG tablet Take 1 tablet (5 mg total) by mouth daily. Patient not taking: Reported on 11/15/2014 03/28/14   Gordy Savers, MD  chlorthalidone (HYGROTON) 25 MG tablet Take 1 tablet (25 mg total) by mouth daily. 07/01/13 07/01/14  Gordy Savers, MD  cloNIDine (CATAPRES) 0.2  MG tablet Take 1 tablet (0.2 mg total) by mouth 2 (two) times daily. Patient not taking: Reported on 11/15/2014 07/01/13   Gordy Savers, MD  diphenhydrAMINE (BENADRYL) 25 MG tablet Take 1 tablet (25 mg total) by mouth every 6 (six) hours. Patient not taking: Reported on 11/15/2014 02/17/14   Mercedes Camprubi-Soms, PA-C  predniSONE (DELTASONE) 20 MG tablet 3 tabs po daily x 5 days Patient not taking: Reported on 11/15/2014 02/17/14   Mercedes Camprubi-Soms, PA-C  ranitidine (ZANTAC) 150 MG tablet Take 1 tablet (150 mg total) by mouth 2 (two) times daily. Patient not taking: Reported on 11/15/2014 02/17/14   Mercedes Camprubi-Soms, PA-C  simvastatin (ZOCOR) 20 MG tablet Take 1 tablet (20 mg total) by mouth at bedtime. Patient not taking: Reported on 11/15/2014 07/01/13   Gordy Savers, MD   BP 152/62 mmHg  Pulse 74  Temp(Src) 98.4 F (36.9 C) (Oral)  Resp 18  SpO2 98% Physical Exam  Constitutional: She is oriented to person, place, and time. She appears well-developed and well-nourished. No distress.  HENT:  Head: Normocephalic and atraumatic.  Eyes: Conjunctivae are normal. Pupils are equal, round, and reactive to light.  Neck: Neck supple.  Cardiovascular: Normal rate, regular rhythm and intact distal pulses.   Pulmonary/Chest: Effort normal and breath sounds normal. No respiratory distress. She has no wheezes. She has no rales. She exhibits no tenderness.  Abdominal: Soft. There is no tenderness.  Musculoskeletal: She exhibits tenderness.  Pt has left sided S/I tenderness to palpation and TTP over left trochanteric bursa.  No redness, warmth or swelling noted.  Normal ROM up to 90 degrees hip flexion on left, pain beyond 90 degrees,  Lymphadenopathy:    She has no cervical adenopathy.  Neurological: She is alert and oriented to person, place, and time. GCS eye subscore is 4. GCS verbal subscore is 5. GCS motor subscore is 6.  3/5 strength in left plantar/dorsiflexion, (residual  stroke weakness)  Skin: Skin is warm and dry. No rash noted. She is not diaphoretic.  Psychiatric: She has a normal mood and affect.  Nursing note and vitals reviewed.   ED Course  Procedures (including critical care time) Labs Review Labs Reviewed - No data to display  Imaging Review Dg Lumbar Spine Complete  11/15/2014   CLINICAL DATA:  No injury. Left lower back and left hip pain beginning last night.  EXAM: LUMBAR SPINE - COMPLETE 4+ VIEW  COMPARISON:  None.  FINDINGS: Degenerative facet disease throughout the lumbar spine, most pronounced at L4-5 and L5-S1. Degenerative disc disease changes in the lower thoracic spine. Disc spaces within the lumbar spine are maintained. No fracture or malalignment. SI joints are symmetric and unremarkable.  IMPRESSION: Degenerative facet disease throughout the lumbar spine, most pronounced in the lower lumbar spine. Degenerative disc disease in the lower thoracic spine. No acute findings.   Electronically Signed   By: Charlett Nose M.D.   On: 11/15/2014 13:00   Dg Hip Unilat With Pelvis 2-3 Views Left  11/15/2014   CLINICAL DATA:  69 year old female with left hip lower back pain since last night. No injury. Initial encounter.  EXAM: LEFT HIP (WITH PELVIS) 2-3 VIEWS  COMPARISON:  None.  FINDINGS: Mild bilateral hip joint degenerative changes.  No fracture or dislocation.  No plain film evidence of osseous destructive lesion.  IMPRESSION: Mild bilateral hip joint degenerative changes.  No fracture or dislocation.   Electronically Signed   By: Lacy DuverneySteven  Olson M.D.   On: 11/15/2014 12:59     EKG Interpretation None      MDM   Final diagnoses:  Pain  SI (sacroiliac) pain   69 yo with ongoing hip pain, no new injury today. Her tenderness is over her left SI joint and trochanteric bursa but no redness, warmth or swelling noted. Pelvis, left hip, and l-spine xray negative for fracture. Discussed findings with pt and family and advised heat therapy,  stretching, and NSAID use. Pt has recently transferred to rehab facility and mentions she has been having decreased bowel movements recently.  Prescription for Miralax provided. Pt is well-appearing, in no acute distress and vital signs reviewed and not concerning. She appears safe to be discharged.  Discharge include follow-up with her PCP and ortho if pain does not improve. Return precautions provided. Pt and family aware of plan and in agreement.   Filed Vitals:   11/15/14 1345 11/15/14 1441 11/15/14 1445 11/15/14 1501  BP: 147/74 129/96 134/96   Pulse: 70  72   Temp:    98.5 F (36.9 C)  TempSrc:    Oral  Resp: 15  17   SpO2: 93%  91%    Meds given in ED:  Medications  HYDROcodone-acetaminophen (NORCO/VICODIN) 5-325 MG per tablet 1 tablet (1 tablet Oral Given 11/15/14 1145)  naproxen (NAPROSYN) tablet 500 mg (500 mg Oral Given 11/15/14 1144)  metoprolol tartrate (LOPRESSOR) tablet 50 mg (50 mg Oral Given 11/15/14 1416)  amLODipine (NORVASC) tablet 10 mg (10 mg Oral Given 11/15/14 1416)  hydrALAZINE (APRESOLINE) tablet 50 mg (50 mg Oral Given 11/15/14 1441)    Discharge Medication List as of 11/15/2014  2:29 PM    START taking these medications   Details  naproxen (NAPROSYN) 500 MG tablet Take 1 tablet (500 mg total) by mouth 2 (two) times daily., Starting 11/15/2014, Until Discontinued, Print    polyethylene glycol powder (GLYCOLAX/MIRALAX) powder Take 17 g by mouth daily. Until daily soft stools  OTC, Starting 11/15/2014, Until Discontinued, Print           Harle BattiestElizabeth Debi Cousin, NP 11/16/14 1537  Geoffery Lyonsouglas Delo, MD 11/16/14 [phone]

## 2014-11-15 NOTE — ED Notes (Signed)
Per [facility], pt from [facility] for slipping out of the bed and sliding on the floor. Pt c/o left hip pain yesterday before sliding out of bed. No shortening or rotation. Pt denies any pain at this time.

## 2014-11-15 NOTE — ED Notes (Signed)
Pt family at bedside and informed on plan of care, pt also requesting something to eat at this time and this RN explained to pt the importance of waiting on xray results to rule out possible hip fracture. Pt and family verbalized understanding.

## 2014-11-15 NOTE — Discharge Instructions (Signed)
Please follow the directions provided.  Be sure to follow-up with your primary care provider to ensure you are getting better.  If your hip pain does not improve, you can follow-up with the orthopedic doctor.  Please take the naproxen twice a day to help with inflammation.  You may use one capful of Miralax to help with constipation.  Don't hesitate to return for any new, worsening or concerning symptoms.    SEEK MEDICAL CARE IF:  You are unable to put weight on your leg.  Your hip is red or swollen or very tender to touch.  Your pain or swelling continues or worsens after 1 week.  You have increasing difficulty walking.  You have a fever

## 2015-01-11 ENCOUNTER — Telehealth: Payer: Self-pay | Admitting: Internal Medicine

## 2015-01-11 ENCOUNTER — Ambulatory Visit (INDEPENDENT_AMBULATORY_CARE_PROVIDER_SITE_OTHER): Payer: Medicare PPO | Admitting: Internal Medicine

## 2015-01-11 ENCOUNTER — Encounter: Payer: Self-pay | Admitting: Internal Medicine

## 2015-01-11 VITALS — BP 140/64 | HR 60 | Temp 98.2°F | Resp 20 | Ht 63.0 in | Wt 229.0 lb

## 2015-01-11 DIAGNOSIS — Z8679 Personal history of other diseases of the circulatory system: Secondary | ICD-10-CM

## 2015-01-11 DIAGNOSIS — R7302 Impaired glucose tolerance (oral): Secondary | ICD-10-CM

## 2015-01-11 DIAGNOSIS — I10 Essential (primary) hypertension: Secondary | ICD-10-CM

## 2015-01-11 NOTE — Progress Notes (Signed)
Subjective:    Patient ID: [name], female    DOB: 02/13/1946, 69 y.o.   MRN: [id number]  HPI  69 year old patient who is admitted at a [facility] in February due to a right brain stroke.  She presented with significant left-sided weakness and has improved over the past few months.  Presently she is residing at [facility].  She is immature with a walker but with some persistent left-sided symptoms.  She is left-handed. She has a history of impaired glucose tolerance, obesity, hypertension and coronary artery disease.  She is requesting FMLA form completion.  In the hopes of returning to work  Past Medical History  Diagnosis Date  . 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 ASTHMA 04/16/2007  . 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 CORONARY ARTERY DISEASE 08/29/2008  . HYPERTENSION 04/16/2007  . URI 10/25/2009  . Hyperlipidemia   . Environmental allergies     History   Social History  . Marital Status: Widowed    Spouse Name: N/A  . Number of Children: N/A  . Years of Education: N/A   Occupational History  . Not on file.   Social History Main Topics  . Smoking status: Never Smoker   . Smokeless tobacco: Never Used  . Alcohol Use: No  . Drug Use: No  . Sexual Activity: Not on file   Other Topics Concern  . Not on file   Social History Narrative    Past Surgical History  Procedure Laterality Date  . Cardiac catheterization      No family history on file.  Allergies  Allergen Reactions  . Lisinopril Swelling  . Zolpidem Tartrate     Other reaction(s): Agitation  . Eggs Or Egg-Derived Products     Upset stomach     Current Outpatient Prescriptions on File Prior to Visit  Medication Sig Dispense Refill  . acetaminophen (TYLENOL) 325 MG tablet Take 650 mg by mouth every 4 (four) hours as needed for mild pain.    Marland Kitchen. albuterol (PROVENTIL HFA;VENTOLIN HFA) 108 (90 BASE) MCG/ACT inhaler Inhale 2 puffs into the lungs every 6 (six) hours as needed  for wheezing or shortness of breath.    Marland Kitchen. amLODipine (NORVASC) 10 MG tablet Take 10 mg by mouth daily.    Marland Kitchen. aspirin 325 MG tablet Take 325 mg by mouth daily.    Marland Kitchen. atorvastatin (LIPITOR) 80 MG tablet Take 80 mg by mouth daily.    . hydrALAZINE (APRESOLINE) 50 MG tablet Take 50 mg by mouth 3 (three) times daily.    Marland Kitchen. loratadine (CLARITIN) 10 MG tablet Take 10 mg by mouth daily.    . Melatonin 3 MG TABS Take 3 mg by mouth at bedtime as needed (insomnia).    . naproxen (NAPROSYN) 500 MG tablet Take 1 tablet (500 mg total) by mouth 2 (two) times daily. 30 tablet 0  . nystatin (MYCOSTATIN/NYSTOP) 100000 UNIT/GM POWD Apply 1 g topically every 8 (eight) hours.    . polyethylene glycol powder (GLYCOLAX/MIRALAX) powder Take 17 g by mouth daily. Until daily soft stools  OTC 119 g 0  . [DISCONTINUED] losartan-hydrochlorothiazide (HYZAAR) 100-25 MG per tablet Take 1 tablet by mouth daily. 30 tablet 3   No current facility-administered medications on file prior to visit.    BP 140/64 mmHg  Pulse 60  Temp(Src) 98.2 F (36.8 C) (Oral)  Resp 20  Ht 5\' 3"  (1.6 m)  Wt 229 lb (103.874 kg)  BMI 40.58 kg/m2  SpO2 97%  Review of Systems  HENT: Negative for congestion, dental problem, hearing loss, rhinorrhea, sinus pressure, sore throat and tinnitus.   Eyes: Positive for visual disturbance. Negative for pain and discharge.  Respiratory: Negative for cough and shortness of breath.   Cardiovascular: Negative for chest pain, palpitations and leg swelling.  Gastrointestinal: Negative for nausea, vomiting, abdominal pain, diarrhea, constipation, blood in stool and abdominal distention.  Genitourinary: Negative for dysuria, urgency, frequency, hematuria, flank pain, vaginal bleeding, vaginal discharge, difficulty urinating, vaginal pain and pelvic pain.  Musculoskeletal: Positive for gait problem. Negative for joint swelling and arthralgias.  Skin: Negative for rash.  Neurological: Positive for  weakness. Negative for dizziness, syncope, speech difficulty, numbness and headaches.  Hematological: Negative for adenopathy.  Psychiatric/Behavioral: Negative for behavioral problems, dysphoric mood and agitation. The patient is not nervous/anxious.        Objective:   Physical Exam  Constitutional: She is oriented to person, place, and time. She appears well-developed and well-nourished.  Blood pressure 140/60  HENT:  Head: Normocephalic.  Right Ear: External ear normal.  Left Ear: External ear normal.  Mouth/Throat: Oropharynx is clear and moist.  Eyes: Conjunctivae and EOM are normal. Pupils are equal, round, and reactive to light.  Nystagmus and strabismus  Neck: Normal range of motion. Neck supple. No thyromegaly present.  Bilateral bruits versus transmitted murmur  Cardiovascular: Normal rate, regular rhythm and intact distal pulses.   Murmur heard. Grade 3/6 systolic murmur  Pulmonary/Chest: Effort normal and breath sounds normal.  Abdominal: Soft. Bowel sounds are normal. She exhibits no mass. There is no tenderness.  Musculoskeletal: Normal range of motion.  Lymphadenopathy:    She has no cervical adenopathy.  Neurological: She is alert and oriented to person, place, and time.  Mild left hemiparesis  Skin: Skin is warm and dry. No rash noted.  Psychiatric: She has a normal mood and affect. Her behavior is normal.          Assessment & Plan:   Cerebrovascular disease.  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

## 2015-01-11 NOTE — Patient Instructions (Signed)
Limit your sodium (Salt) intake    It is important that you exercise regularly, at least 20 minutes 3 to 4 times per week.  If you develop chest pain or shortness of breath seek  medical attention.  Please check your blood pressure on a regular basis.  If it is consistently greater than 150/90, please make an office appointment.  Return in 3 months for follow-up  FMLA forms to be completed upon arrival

## 2015-01-11 NOTE — Telephone Encounter (Signed)
Spoke to pt's daughter [name], told her to check with the Nursing Home I think they will setup what she needs when she is discharged. If not let me know and I will see about orders for Home Health. [name] verbalized understanding.

## 2015-01-11 NOTE — Telephone Encounter (Signed)
Pt daughter would like md to put order in for home health assistant,. Pt had a stroke. Pt has [employer]. Pt was seen today

## 2015-01-11 NOTE — Progress Notes (Signed)
Pre visit review using our clinic review tool, if applicable. No additional management support is needed unless otherwise documented below in the visit note. 

## 2015-01-25 ENCOUNTER — Telehealth: Payer: Self-pay | Admitting: Internal Medicine

## 2015-01-25 NOTE — Telephone Encounter (Signed)
Pt being dc'd from [facility] tomorrow and needed asst in the home. They feel [facility] would be the best way to go.. Pt had a stroke, in wheel chair/walker. Need Occupational therapy, physical therapy and nursing asst.  Ms hoag would like to get his implemented by the time pt goes home tomorrow. Advised her dr Kirtland Bouchardk was out but would send the message

## 2015-01-25 NOTE — Telephone Encounter (Signed)
Spoke to [place] psychotherapistsocial worker at [facility], she said pt is being discharged tomorrow and needs assistance at home, Physical therapy and Occupational Therapy and she does not take [employer] insurance Medicare only Medicaid. Vassie Moselleold Nancy the nursing home should be able to set up for pt, otherwise pt will have to make an appointment with Dr.K for a Face to Face for home health referral to have help and be covered by insurance. Harriett Sineancy verbalized understanding and will let family know.

## 2015-01-31 ENCOUNTER — Encounter: Payer: Self-pay | Admitting: Internal Medicine

## 2015-01-31 ENCOUNTER — Ambulatory Visit (INDEPENDENT_AMBULATORY_CARE_PROVIDER_SITE_OTHER): Payer: Medicare PPO | Admitting: Internal Medicine

## 2015-01-31 VITALS — BP 140/60 | HR 56 | Temp 98.0°F | Resp 20 | Ht 63.0 in | Wt 217.0 lb

## 2015-01-31 DIAGNOSIS — R7302 Impaired glucose tolerance (oral): Secondary | ICD-10-CM

## 2015-01-31 DIAGNOSIS — I2510 Atherosclerotic heart disease of native coronary artery without angina pectoris: Secondary | ICD-10-CM

## 2015-01-31 DIAGNOSIS — I10 Essential (primary) hypertension: Secondary | ICD-10-CM

## 2015-01-31 DIAGNOSIS — Z8679 Personal history of other diseases of the circulatory system: Secondary | ICD-10-CM

## 2015-01-31 NOTE — Progress Notes (Signed)
Subjective:    Patient ID: [name], female    DOB: 1946/07/06, 69 y.o.   MRN: [id number]  HPI 69 year old patient, history of hypertension, impaired glucose tolerance and coronary artery disease.  [place] 8 for an acute right brain stroke that has resulted in left-sided weakness.  She was released from a rehabilitation facility .  5 days ago and now resides at home.  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 Home physical therapy/occupational therapy was not arranged at the extended care facility prior to her discharge She is seen today for a face to face evaluation to determine benefit of further physical therapy.  The patient continues to have considerable left-sided weakness and is a high fall risk.  She has improved with physical therapy at the rehabilitation facility.  She is motivated to return to work and to further improve.   Past Medical History  Diagnosis Date  . 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 ASTHMA 04/16/2007  . 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 CORONARY ARTERY DISEASE 08/29/2008  . HYPERTENSION 04/16/2007  . URI 10/25/2009  . Hyperlipidemia   . Environmental allergies     History   Social History  . Marital Status: Widowed    Spouse Name: N/A  . Number of Children: N/A  . Years of Education: N/A   Occupational History  . Not on file.   Social History Main Topics  . Smoking status: Never Smoker   . Smokeless tobacco: Never Used  . Alcohol Use: No  . Drug Use: No  . Sexual Activity: Not on file   Other Topics Concern  . Not on file   Social History Narrative    Past Surgical History  Procedure Laterality Date  . Cardiac catheterization      No family history on file.  Allergies  Allergen Reactions  . Lisinopril Swelling  . Zolpidem Tartrate     Other reaction(s): Agitation    . Eggs Or Egg-Derived Products     Upset stomach     Current Outpatient Prescriptions on File Prior to Visit  Medication Sig Dispense Refill  . acetaminophen (TYLENOL) 325 MG tablet Take 650 mg by mouth every 4 (four) hours as needed for mild pain.    Marland Kitchen. albuterol (PROVENTIL HFA;VENTOLIN HFA) 108 (90 BASE) MCG/ACT inhaler Inhale 2 puffs into the lungs every 6 (six) hours as needed for wheezing or shortness of breath.    Marland Kitchen. amLODipine (NORVASC) 10 MG tablet Take 10 mg by mouth daily.    Marland Kitchen. aspirin 325 MG tablet Take 325 mg by mouth daily.    Marland Kitchen. atorvastatin (LIPITOR) 80 MG tablet Take 80 mg by mouth daily.    Marland Kitchen. BREO ELLIPTA 100-25 MCG/INH AEPB     . hydrALAZINE (APRESOLINE) 50 MG tablet Take 50 mg by mouth 3 (three) times daily.    Marland Kitchen. loratadine (CLARITIN) 10 MG tablet Take 10 mg by mouth daily.    . Melatonin 3 MG TABS Take 3 mg by mouth at bedtime as needed (insomnia).    . metoprolol (LOPRESSOR) 100 MG tablet Take 100 mg by mouth 2 (two) times daily.     Marland Kitchen. nystatin (MYCOSTATIN/NYSTOP) 100000 UNIT/GM POWD Apply 1 g topically every 8 (eight) hours.    . polyethylene glycol powder (GLYCOLAX/MIRALAX) powder Take 17 g by mouth daily. Until daily soft  stools  OTC 119 g 0  . [DISCONTINUED] losartan-hydrochlorothiazide (HYZAAR) 100-25 MG per tablet Take 1 tablet by mouth daily. 30 tablet 3   No current facility-administered medications on file prior to visit.    BP 140/60 mmHg  Pulse 56  Temp(Src) 98 F (36.7 C) (Oral)  Resp 20  Ht  (1.6 m)  Wt 217 lb (98.431 kg)  BMI 38.45 kg/m2  SpO2 98%     Review of Systems  HENT: Negative for congestion, dental problem, hearing loss, rhinorrhea, sinus pressure, sore throat and tinnitus.   Eyes: Negative for pain, discharge and visual disturbance.  Respiratory: Negative for cough and shortness of breath.   Cardiovascular: Negative for chest pain, palpitations and leg swelling.  Gastrointestinal: Negative for nausea, vomiting, abdominal  pain, diarrhea, constipation, blood in stool and abdominal distention.  Genitourinary: Negative for dysuria, urgency, frequency, hematuria, flank pain, vaginal bleeding, vaginal discharge, difficulty urinating, vaginal pain and pelvic pain.  Musculoskeletal: Positive for gait problem. Negative for joint swelling and arthralgias.  Skin: Negative for rash.  Neurological: Positive for weakness. Negative for dizziness, syncope, speech difficulty, numbness and headaches.  Hematological: Negative for adenopathy.  Psychiatric/Behavioral: Negative for behavioral problems, dysphoric mood and agitation. The patient is not nervous/anxious.        Objective:   Physical Exam  Constitutional: She is oriented to person, place, and time. She appears well-developed and well-nourished.  Blood pressure 120/60  HENT:  Head: Normocephalic.  Right Ear: External ear normal.  Left Ear: External ear normal.  Mouth/Throat: Oropharynx is clear and moist.  Disconjugate gaze  Impaired vision  Eyes: Conjunctivae and EOM are normal. Pupils are equal, round, and reactive to light.  Neck: Normal range of motion. Neck supple. No thyromegaly present.  Cardiovascular: Normal rate, regular rhythm, normal heart sounds and intact distal pulses.   Pulmonary/Chest: Effort normal and breath sounds normal.  Abdominal: Soft. Bowel sounds are normal. She exhibits no mass. There is no tenderness.  Musculoskeletal: Normal range of motion.  Lymphadenopathy:    She has no cervical adenopathy.  Neurological: She is alert and oriented to person, place, and time.  Left-sided weakness  Skin: Skin is warm and dry. No rash noted.  Psychiatric: She has a normal mood and affect. Her behavior is normal.          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  Continues to improve.  Patient quite motivated to continue physical therapy in hopes of further improvement and returned to work.  Will  set up for home PT and OT Hypertension, well-controlled Impaired glucose tolerance History of coronary artery disease, stable   Request FMLA form completion.  Anticipate we will receive these from her employer

## 2015-01-31 NOTE — Progress Notes (Signed)
Pre visit review using our clinic review tool, if applicable. No additional management support is needed unless otherwise documented below in the visit note. 

## 2015-01-31 NOTE — Patient Instructions (Signed)
Return as scheduled for follow-up  home physical therapy and occupational therapy as discussed

## 2015-02-01 ENCOUNTER — Other Ambulatory Visit: Payer: Self-pay | Admitting: Internal Medicine

## 2015-02-01 DIAGNOSIS — I2510 Atherosclerotic heart disease of native coronary artery without angina pectoris: Secondary | ICD-10-CM

## 2015-02-01 DIAGNOSIS — Z8679 Personal history of other diseases of the circulatory system: Secondary | ICD-10-CM

## 2015-02-06 ENCOUNTER — Telehealth: Payer: Self-pay | Admitting: Internal Medicine

## 2015-02-06 NOTE — Telephone Encounter (Signed)
2nd part of note...Marland KitchenMarland Kitchenspoke with [name] from [facility] regarding pt care. [name] stated he is requesting occupational therapy as well due to pt having a stroke recently.

## 2015-02-06 NOTE — Telephone Encounter (Signed)
Received call from [facility] for verbal approval to continue pt care but got disconnected before getting contact info.

## 2015-02-07 NOTE — Telephone Encounter (Signed)
Okay to order Occupational Therapy for pt?

## 2015-02-07 NOTE — Telephone Encounter (Signed)
ok 

## 2015-02-07 NOTE — Telephone Encounter (Signed)
Left detailed message for [name] with [facility], order given for Occupational Therapy okay for pt per Dr.K. Call if any questions.

## 2015-02-09 ENCOUNTER — Telehealth: Payer: Self-pay | Admitting: Internal Medicine

## 2015-02-09 MED ORDER — METOPROLOL TARTRATE 100 MG PO TABS: 100.0000 mg | ORAL_TABLET | Freq: Two times a day (BID) | ORAL | Status: DC

## 2015-02-09 MED ORDER — ATORVASTATIN CALCIUM 80 MG PO TABS: 80.0000 mg | ORAL_TABLET | Freq: Every day | ORAL | Status: DC

## 2015-02-09 MED ORDER — HYDRALAZINE HCL 50 MG PO TABS: 50.0000 mg | ORAL_TABLET | Freq: Three times a day (TID) | ORAL | Status: DC

## 2015-02-09 MED ORDER — AMLODIPINE BESYLATE 10 MG PO TABS: 10.0000 mg | ORAL_TABLET | Freq: Every day | ORAL | Status: DC

## 2015-02-09 NOTE — Telephone Encounter (Signed)
Pt needs 30 day supply with refills on each medication Amlodipine, hydralize #90 ,atorvastatin  and metoprolol #60 sent to [facility] [place]. These meds will be [facility] to [facility]

## 2015-02-09 NOTE — Telephone Encounter (Signed)
Rx's sent to pharmacy as requested. 

## 2015-02-12 ENCOUNTER — Telehealth: Payer: Self-pay | Admitting: *Deleted

## 2015-02-12 NOTE — Telephone Encounter (Signed)
[name] from [facility] called to inform Dr Kirtland Bouchard that the patient fell over the weekend.  She has a "rug burn" on her left forearm that is not serious.  Patient is not complaining of any pain.   [name] [phone].

## 2015-02-15 DIAGNOSIS — I69354 Hemiplegia and hemiparesis following cerebral infarction affecting left non-dominant side: Secondary | ICD-10-CM | POA: Diagnosis not present

## 2015-02-15 DIAGNOSIS — I2510 Atherosclerotic heart disease of native coronary artery without angina pectoris: Secondary | ICD-10-CM | POA: Diagnosis not present

## 2015-02-15 DIAGNOSIS — E785 Hyperlipidemia, unspecified: Secondary | ICD-10-CM | POA: Diagnosis not present

## 2015-02-15 DIAGNOSIS — R7309 Other abnormal glucose: Secondary | ICD-10-CM | POA: Diagnosis not present

## 2015-03-01 ENCOUNTER — Encounter: Payer: Self-pay | Admitting: Internal Medicine

## 2015-03-01 ENCOUNTER — Ambulatory Visit (INDEPENDENT_AMBULATORY_CARE_PROVIDER_SITE_OTHER): Payer: Medicare PPO | Admitting: Internal Medicine

## 2015-03-01 VITALS — BP 130/80 | HR 61 | Temp 98.9°F | Resp 20 | Ht 63.0 in | Wt 217.0 lb

## 2015-03-01 DIAGNOSIS — I10 Essential (primary) hypertension: Secondary | ICD-10-CM

## 2015-03-01 DIAGNOSIS — R350 Frequency of micturition: Secondary | ICD-10-CM

## 2015-03-01 DIAGNOSIS — R8290 Unspecified abnormal findings in urine: Secondary | ICD-10-CM

## 2015-03-01 DIAGNOSIS — H543 Unqualified visual loss, both eyes: Secondary | ICD-10-CM

## 2015-03-01 DIAGNOSIS — H542 Low vision, both eyes: Secondary | ICD-10-CM

## 2015-03-01 LAB — POCT URINALYSIS DIPSTICK
Bilirubin, UA: NEGATIVE
Glucose, UA: NEGATIVE
KETONES UA: NEGATIVE
Nitrite, UA: POSITIVE
PH UA: 6
Protein, UA: NEGATIVE
RBC UA: NEGATIVE
Spec Grav, UA: 1.02
Urobilinogen, UA: 1

## 2015-03-01 MED ORDER — AMLODIPINE BESYLATE 10 MG PO TABS: 10.0000 mg | ORAL_TABLET | Freq: Every day | ORAL | Status: DC

## 2015-03-01 MED ORDER — SULFAMETHOXAZOLE-TRIMETHOPRIM 800-160 MG PO TABS: 1.0000 | ORAL_TABLET | Freq: Two times a day (BID) | ORAL | Status: DC

## 2015-03-01 NOTE — Progress Notes (Signed)
Subjective:    Patient ID: [name], female    DOB: 09-25-1945, 69 y.o.   MRN: [id number]  HPI 69 year old patient who has essential hypertension.  She also has a history of recurrent UTIs.  The past several days she has noted a foul odor to the urine with urinary frequency, urgency and incontinence.  No real dysuria.  Past Medical History  Diagnosis Date  . 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 ASTHMA 04/16/2007  . 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 CORONARY ARTERY DISEASE 08/29/2008  . HYPERTENSION 04/16/2007  . URI 10/25/2009  . Hyperlipidemia   . Environmental allergies     History   Social History  . Marital Status: Widowed    Spouse Name: N/A  . Number of Children: N/A  . Years of Education: N/A   Occupational History  . Not on file.   Social History Main Topics  . Smoking status: Never Smoker   . Smokeless tobacco: Never Used  . Alcohol Use: No  . Drug Use: No  . Sexual Activity: Not on file   Other Topics Concern  . Not on file   Social History Narrative    Past Surgical History  Procedure Laterality Date  . Cardiac catheterization      No family history on file.  Allergies  Allergen Reactions  . Lisinopril Swelling  . Zolpidem Tartrate     Other reaction(s): Agitation  . Eggs Or Egg-Derived Products     Upset stomach     Current Outpatient Prescriptions on File Prior to Visit  Medication Sig Dispense Refill  . acetaminophen (TYLENOL) 325 MG tablet Take 650 mg by mouth every 4 (four) hours as needed for mild pain.    Marland Kitchen albuterol (PROVENTIL HFA;VENTOLIN HFA) 108 (90 BASE) MCG/ACT inhaler Inhale 2 puffs into the lungs every 6 (six) hours as needed for wheezing or shortness of breath.    Marland Kitchen aspirin 325 MG tablet Take 325 mg by mouth daily.    Marland Kitchen atorvastatin (LIPITOR) 80 MG tablet Take 1 tablet (80 mg total) by mouth daily. 30 tablet 5  . BREO ELLIPTA 100-25 MCG/INH AEPB     . hydrALAZINE (APRESOLINE) 50 MG tablet Take 1 tablet (50 mg total) by  mouth 3 (three) times daily. 90 tablet 5  . loratadine (CLARITIN) 10 MG tablet Take 10 mg by mouth daily.    . Melatonin 3 MG TABS Take 3 mg by mouth at bedtime as needed (insomnia).    . metoprolol (LOPRESSOR) 100 MG tablet Take 1 tablet (100 mg total) by mouth 2 (two) times daily. 60 tablet 5  . naproxen (NAPROSYN) 500 MG tablet     . nystatin (MYCOSTATIN/NYSTOP) 100000 UNIT/GM POWD Apply 1 g topically every 8 (eight) hours.    . polyethylene glycol powder (GLYCOLAX/MIRALAX) powder Take 17 g by mouth daily. Until daily soft stools  OTC 119 g 0  . [DISCONTINUED] losartan-hydrochlorothiazide (HYZAAR) 100-25 MG per tablet Take 1 tablet by mouth daily. 30 tablet 3   No current facility-administered medications on file prior to visit.    BP 130/80 mmHg  Pulse 61  Temp(Src) 98.9 F (37.2 C) (Oral)  Resp 20  Ht  (1.6 m)  Wt 217 lb (98.431 kg)  BMI 38.45 kg/m2  SpO2 98%      Review of Systems  Constitutional: Negative.   HENT: Negative for congestion, dental problem, hearing loss, rhinorrhea, sinus pressure, sore throat and tinnitus.   Eyes: Positive for visual disturbance. Negative for  pain and discharge.       Complaining of decrease visual acuity.  No recent eye exam  Respiratory: Negative for cough and shortness of breath.   Cardiovascular: Negative for chest pain, palpitations and leg swelling.  Gastrointestinal: Negative for nausea, vomiting, abdominal pain, diarrhea, constipation, blood in stool and abdominal distention.  Genitourinary: Positive for urgency, frequency and difficulty urinating. Negative for dysuria, hematuria, flank pain, vaginal bleeding, vaginal discharge, vaginal pain and pelvic pain.  Musculoskeletal: Negative for joint swelling, arthralgias and gait problem.  Skin: Negative for rash.  Neurological: Negative for dizziness, syncope, speech difficulty, weakness, numbness and headaches.  Hematological: Negative for adenopathy.  Psychiatric/Behavioral:  Negative for behavioral problems, dysphoric mood and agitation. The patient is not nervous/anxious.        Objective:   Physical Exam  Constitutional: She is oriented to person, place, and time. She appears well-developed and well-nourished.  Blood pressure normal  HENT:  Head: Normocephalic.  Right Ear: External ear normal.  Left Ear: External ear normal.  Mouth/Throat: Oropharynx is clear and moist.  Eyes: Conjunctivae and EOM are normal. Pupils are equal, round, and reactive to light.  Very mild conjunctival injection Disconjugate gaze  Neck: Normal range of motion. Neck supple. No thyromegaly present.  Cardiovascular: Normal rate, regular rhythm, normal heart sounds and intact distal pulses.   Pulmonary/Chest: Effort normal and breath sounds normal.  Abdominal: Soft. Bowel sounds are normal. She exhibits no mass. There is no tenderness.  Musculoskeletal: Normal range of motion.  Lymphadenopathy:    She has no cervical adenopathy.  Neurological: She is alert and oriented to person, place, and time.  Skin: Skin is warm and dry. No rash noted.  Psychiatric: She has a normal mood and affect. Her behavior is normal.          Assessment & Plan:   Essential hypertension, well-controlled UTI.  Will treat with the Septra DS for 7 days  Schedule CPX Schedule eye exam

## 2015-03-01 NOTE — Progress Notes (Signed)
Pre visit review using our clinic review tool, if applicable. No additional management support is needed unless otherwise documented below in the visit note. 

## 2015-03-01 NOTE — Patient Instructions (Signed)
Drink as much fluid as you  can tolerate over the next few days  Take your antibiotic as prescribed until ALL of it is gone, but stop if you develop a rash, swelling, or any side effects of the medication.  Contact our office as soon as possible if  there are side effects of the medication.  Return in 3 months for your annual exam

## 2015-03-02 ENCOUNTER — Encounter: Payer: Self-pay | Admitting: Gastroenterology

## 2015-03-07 ENCOUNTER — Telehealth: Payer: Self-pay | Admitting: Internal Medicine

## 2015-03-07 NOTE — Telephone Encounter (Signed)
[name] from [facility] call want a order to continue Home Health PT 2 Tons a week for 3 weeks to progress her balance gait and endurance.Anita HuaDavid would like a call back at this #  [phone]

## 2015-03-08 NOTE — Telephone Encounter (Signed)
Okay to continue Physical Therapy? 

## 2015-03-08 NOTE — Telephone Encounter (Signed)
Left detailed message on David's personal voicemail, order to continue Home Health PT 2 times a week for 3 weeks to progress her balance gait and endurance okay per Dr.K.

## 2015-03-08 NOTE — Telephone Encounter (Signed)
ok 

## 2015-03-20 ENCOUNTER — Telehealth: Payer: Self-pay | Admitting: Internal Medicine

## 2015-03-20 MED ORDER — ALBUTEROL SULFATE HFA 108 (90 BASE) MCG/ACT IN AERS: 2.0000 | INHALATION_SPRAY | Freq: Four times a day (QID) | RESPIRATORY_TRACT | Status: AC | PRN

## 2015-03-20 NOTE — Telephone Encounter (Signed)
Pt would like a refill of albuterol (PROVENTIL HFA;VENTOLIN HFA) 108 (90 BASE) MCG/ACT inhaler  [facility] [place]  Pt has had minor breathing issues during this hot weather

## 2015-03-20 NOTE — Telephone Encounter (Signed)
Pt notified Rx sent to pharmacy

## 2015-03-21 NOTE — Telephone Encounter (Signed)
[name] called back about the below message stating he did not receive the voice mail.  I read Donna's message to [name] and he verbalized understanding.

## 2015-03-30 ENCOUNTER — Telehealth: Payer: Self-pay | Admitting: Internal Medicine

## 2015-03-30 NOTE — Telephone Encounter (Signed)
Left message on machine for verbal okay.

## 2015-03-30 NOTE — Telephone Encounter (Signed)
FYI: Pt had a fall in her restroom. Lost her balance and broke walker leg in half  Her certification endsn ext Friday, but due to this fall he would like to add another 2 visits and then re-certify pt. At a frequency of 2 X wk for 4 wks  Pt has been approved by [facility], but not until November.  So he will be able to follow her a little closer to that date Verbal OK

## 2015-03-30 NOTE — Telephone Encounter (Signed)
ok 

## 2015-04-17 ENCOUNTER — Ambulatory Visit: Payer: Self-pay | Admitting: Internal Medicine

## 2015-04-18 ENCOUNTER — Ambulatory Visit: Payer: Self-pay | Admitting: Internal Medicine

## 2015-04-23 DIAGNOSIS — I69354 Hemiplegia and hemiparesis following cerebral infarction affecting left non-dominant side: Secondary | ICD-10-CM | POA: Diagnosis not present

## 2015-06-06 ENCOUNTER — Encounter: Payer: Medicare PPO | Admitting: Internal Medicine

## 2015-06-06 DIAGNOSIS — Z0289 Encounter for other administrative examinations: Secondary | ICD-10-CM

## 2015-09-07 ENCOUNTER — Other Ambulatory Visit: Payer: Self-pay | Admitting: Family Medicine

## 2015-09-07 DIAGNOSIS — Z1231 Encounter for screening mammogram for malignant neoplasm of breast: Secondary | ICD-10-CM

## 2015-09-26 ENCOUNTER — Ambulatory Visit
Admission: RE | Admit: 2015-09-26 | Discharge: 2015-09-26 | Disposition: A | Discharge status: Home / Self Care | Payer: No Typology Code available for payment source | Source: Ambulatory Visit | Attending: Family Medicine | Admitting: Family Medicine

## 2015-09-26 DIAGNOSIS — Z1231 Encounter for screening mammogram for malignant neoplasm of breast: Secondary | ICD-10-CM

## 2016-07-20 ENCOUNTER — Emergency Department (HOSPITAL_COMMUNITY)
Admission: EM | Admit: 2016-07-20 | Discharge: 2016-07-20 | Disposition: A | Discharge status: Home / Self Care | Payer: Medicare (Managed Care) | Attending: Emergency Medicine | Admitting: Emergency Medicine

## 2016-07-20 ENCOUNTER — Emergency Department (HOSPITAL_COMMUNITY): Payer: Medicare (Managed Care)

## 2016-07-20 ENCOUNTER — Encounter (HOSPITAL_COMMUNITY): Payer: Self-pay

## 2016-07-20 DIAGNOSIS — I2510 Atherosclerotic heart disease of native coronary artery without angina pectoris: Secondary | ICD-10-CM | POA: Diagnosis not present

## 2016-07-20 DIAGNOSIS — W1839XA Other fall on same level, initial encounter: Secondary | ICD-10-CM | POA: Insufficient documentation

## 2016-07-20 DIAGNOSIS — J45909 Unspecified asthma, uncomplicated: Secondary | ICD-10-CM | POA: Insufficient documentation

## 2016-07-20 DIAGNOSIS — S7012XA Contusion of left thigh, initial encounter: Secondary | ICD-10-CM | POA: Diagnosis not present

## 2016-07-20 DIAGNOSIS — Y929 Unspecified place or not applicable: Secondary | ICD-10-CM | POA: Insufficient documentation

## 2016-07-20 DIAGNOSIS — Z79899 Other long term (current) drug therapy: Secondary | ICD-10-CM | POA: Insufficient documentation

## 2016-07-20 DIAGNOSIS — Y939 Activity, unspecified: Secondary | ICD-10-CM | POA: Diagnosis not present

## 2016-07-20 DIAGNOSIS — Y999 Unspecified external cause status: Secondary | ICD-10-CM | POA: Diagnosis not present

## 2016-07-20 DIAGNOSIS — I10 Essential (primary) hypertension: Secondary | ICD-10-CM | POA: Insufficient documentation

## 2016-07-20 DIAGNOSIS — Z7982 Long term (current) use of aspirin: Secondary | ICD-10-CM | POA: Diagnosis not present

## 2016-07-20 DIAGNOSIS — S7002XA Contusion of left hip, initial encounter: Secondary | ICD-10-CM | POA: Diagnosis not present

## 2016-07-20 DIAGNOSIS — S79912A Unspecified injury of left hip, initial encounter: Secondary | ICD-10-CM | POA: Diagnosis present

## 2016-07-20 DIAGNOSIS — W19XXXA Unspecified fall, initial encounter: Secondary | ICD-10-CM

## 2016-07-20 LAB — BASIC METABOLIC PANEL
ANION GAP: 8 (ref 5–15)
BUN: 21 mg/dL — ABNORMAL HIGH (ref 6–20)
CALCIUM: 9.9 mg/dL (ref 8.9–10.3)
CO2: 31 mmol/L (ref 22–32)
Chloride: 103 mmol/L (ref 101–111)
Creatinine, Ser: 1.19 mg/dL — ABNORMAL HIGH (ref 0.44–1.00)
GFR calc non Af Amer: 46 mL/min — ABNORMAL LOW (ref >60)
GFR, EST AFRICAN AMERICAN: 53 mL/min — AB (ref >60)
Glucose, Bld: 99 mg/dL (ref 65–99)
Potassium: 3.6 mmol/L (ref 3.5–5.1)
Sodium: 142 mmol/L (ref 135–145)

## 2016-07-20 LAB — CBC WITH DIFFERENTIAL/PLATELET
BASOS ABS: 0 10*3/uL (ref 0.0–0.1)
BASOS PCT: 1 %
Eosinophils Absolute: 0.3 10*3/uL (ref 0.0–0.7)
Eosinophils Relative: 5 %
HEMATOCRIT: 37 % (ref 36.0–46.0)
HEMOGLOBIN: 11.8 g/dL — AB (ref 12.0–15.0)
LYMPHS PCT: 43 %
Lymphs Abs: 2.4 10*3/uL (ref 0.7–4.0)
MCH: 27.3 pg (ref 26.0–34.0)
MCHC: 31.9 g/dL (ref 30.0–36.0)
MCV: 85.6 fL (ref 78.0–100.0)
MONO ABS: 0.3 10*3/uL (ref 0.1–1.0)
Monocytes Relative: 5 %
NEUTROS PCT: 46 %
Neutro Abs: 2.6 10*3/uL (ref 1.7–7.7)
Platelets: 168 10*3/uL (ref 150–400)
RBC: 4.32 MIL/uL (ref 3.87–5.11)
RDW: 15 % (ref 11.5–15.5)
WBC: 5.6 10*3/uL (ref 4.0–10.5)

## 2016-07-20 MED ORDER — HYDROCODONE-ACETAMINOPHEN 5-325 MG PO TABS
1.0000 | ORAL_TABLET | Freq: Once | ORAL | Status: AC
  Administered 2016-07-20: 1 via ORAL
  Filled 2016-07-20: qty 1

## 2016-07-20 NOTE — Discharge Instructions (Signed)
Follow up with your doctor this week for re-evaluation.   Your imaging today did not show any hip fracture. If you experience worsening hip pain with difficulty walking or bearing weight, you should return to the emergency room or see your doctor. You may need repeat imaging of your hip.  If you have frequent falls, talk to your doctor about referral for home physical therapy.   You may continue to take home pain medicines as prescribed.

## 2016-07-20 NOTE — ED Provider Notes (Signed)
[facility]-EMERGENCY DEPT Provider Note   CSN: [id number] Arrival date & time: 07/20/16  1846     History   Chief Complaint Chief Complaint  Patient presents with  . Fall    HPI [name] is a 70 y.o. female.  HPI Patient is a 70 year old female with past nuchal history of prior CVA with residual left lower show any deficits, CAD, hypertension, hyperlipidemia who presents with left hip pain after mechanical fall. Patient reports she was attempting to sit on a chair just prior to arrival when she slipped and fell down onto her left hip. She pressed her life alert button and EMS was called. They alerted her daughter who was able to get patient on floor. Daughter reports reports patient was bearing weight on both her extremities but complained of severe pain in her left hip. She is also walking much slower than usual. Patient denies hitting head, loss of consciousness or other injuries. She was in her normal state of health prior to the fall. She does not take blood thinners.  Past Medical History:  Diagnosis Date  . 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 ASTHMA 04/16/2007  . 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 CORONARY ARTERY DISEASE 08/29/2008  . Environmental allergies   . Hyperlipidemia   . HYPERTENSION 04/16/2007  . URI 10/25/2009    Patient Active Problem List   Diagnosis Date Noted  . Impaired glucose tolerance 12/14/2013  . Obesity 12/14/2013  . URI 10/25/2009  . Coronary atherosclerosis 08/29/2008  . Essential hypertension 04/16/2007  . ASTHMA 04/16/2007  . 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 History of cardiovascular disorder 04/16/2007    Past Surgical History:  Procedure Laterality Date  . CARDIAC CATHETERIZATION      OB History    No data available       Home Medications    Prior to Admission medications   Medication Sig Start Date End Date Taking? Authorizing Provider  acetaminophen (TYLENOL) 325 MG tablet Take 650 mg by mouth every 4 (four) hours as needed for  mild pain.    Historical Provider, MD  albuterol (PROVENTIL HFA;VENTOLIN HFA) 108 (90 BASE) MCG/ACT inhaler Inhale 2 puffs into the lungs every 6 (six) hours as needed for wheezing or shortness of breath. 03/20/15   Gordy SaversPeter F Kwiatkowski, MD  amLODipine (NORVASC) 10 MG tablet Take 1 tablet (10 mg total) by mouth daily. 03/01/15   Gordy SaversPeter F Kwiatkowski, MD  aspirin 325 MG tablet Take 325 mg by mouth daily.    Historical Provider, MD  atorvastatin (LIPITOR) 80 MG tablet Take 1 tablet (80 mg total) by mouth daily. 02/09/15   Gordy SaversPeter F Kwiatkowski, MD  BREO ELLIPTA 100-25 MCG/INH AEPB  01/01/15   Historical Provider, MD  hydrALAZINE (APRESOLINE) 50 MG tablet Take 1 tablet (50 mg total) by mouth 3 (three) times daily. 02/09/15   Gordy SaversPeter F Kwiatkowski, MD  loratadine (CLARITIN) 10 MG tablet Take 10 mg by mouth daily.    Historical Provider, MD  Melatonin 3 MG TABS Take 3 mg by mouth at bedtime as needed (insomnia).    Historical Provider, MD  metoprolol (LOPRESSOR) 100 MG tablet Take 1 tablet (100 mg total) by mouth 2 (two) times daily. 02/09/15   Gordy SaversPeter F Kwiatkowski, MD  naproxen (NAPROSYN) 500 MG tablet  01/11/15   Historical Provider, MD  nystatin (MYCOSTATIN/NYSTOP) 100000 UNIT/GM POWD Apply 1 g topically every 8 (eight) hours.    Historical Provider, MD  polyethylene glycol powder (GLYCOLAX/MIRALAX) powder Take 17 g by mouth daily. Until  daily soft stools  OTC 11/15/14   Harle Battiest, NP  sulfamethoxazole-trimethoprim (BACTRIM DS,SEPTRA DS) 800-160 MG per tablet Take 1 tablet by mouth 2 (two) times daily. 03/01/15   Gordy Savers, MD    Family History No family history on file.  Social History Social History  Substance Use Topics  . Smoking status: Never Smoker  . Smokeless tobacco: Never Used  . Alcohol use No     Allergies   Lisinopril; Zolpidem tartrate; and Eggs or egg-derived products   Review of Systems Review of Systems  Constitutional: Negative for chills and fever.  HENT:  Negative for ear pain and sore throat.   Eyes: Negative for pain and visual disturbance.  Respiratory: Negative for cough and shortness of breath.   Cardiovascular: Negative for chest pain and palpitations.  Gastrointestinal: Negative for abdominal pain, nausea and vomiting.  Genitourinary: Negative for dysuria and hematuria.  Musculoskeletal: Negative for back pain and neck pain.  Skin: Negative for color change and rash.  Neurological: Positive for weakness (chronic LLE weakness). Negative for seizures and syncope.  Psychiatric/Behavioral: Negative for confusion.  All other systems reviewed and are negative.    Physical Exam Updated Vital Signs BP (!) 130/53 (BP Location: Left Arm)   Pulse (!) 58   Resp 15   SpO2 96%   Physical Exam  Constitutional: She is oriented to person, place, and time. No distress.  Appears much older than stated age  HENT:  Head: Normocephalic and atraumatic.  Eyes: Conjunctivae are normal.  +strabismus  Neck: Normal range of motion. Neck supple.  Cardiovascular: Normal rate and regular rhythm.   No murmur heard. Pulmonary/Chest: Effort normal and breath sounds normal. No respiratory distress. She has no wheezes. She exhibits no tenderness.  Abdominal: Soft. She exhibits no distension. There is no tenderness.  Musculoskeletal:  Left leg is externally rotated, but not shortened. TTP over left anterior and posterior hip. Distal LLE neurovascularly intact. RLE atraumatic.   Neurological: She is alert and oriented to person, place, and time.  Skin: Skin is warm and dry. Capillary refill takes less than 2 seconds.  Psychiatric: She has a normal mood and affect.  Nursing note and vitals reviewed.    ED Treatments / Results  Labs (all labs ordered are listed, but only abnormal results are displayed) Labs Reviewed  CBC WITH DIFFERENTIAL/PLATELET - Abnormal; Notable for the following:       Result Value   Hemoglobin 11.8 (*)    All other components  within normal limits  BASIC METABOLIC PANEL - Abnormal; Notable for the following:    BUN 21 (*)    Creatinine, Ser 1.19 (*)    GFR calc non Af Amer 46 (*)    GFR calc Af Amer 53 (*)    All other components within normal limits    EKG  EKG Interpretation None       Radiology Dg Pelvis 1-2 Views  Result Date: 07/20/2016 CLINICAL DATA:  Fall and pain in the low back pain and left hip EXAM: PELVIS - 1-2 VIEW COMPARISON:  None. FINDINGS: Vascular calcifications within the pelvis and proximal thighs. No acute fracture or dislocation. Mild degenerative changes of the bilateral hips. Pubic symphysis appears intact. IMPRESSION: 1. No definite acute osseous abnormality. 2. Vascular calcification Electronically Signed   By: Jasmine Pang M.D.   On: 07/20/2016 20:37   Dg Femur Min 2 Views Left  Result Date: 07/20/2016 CLINICAL DATA:  Fall at home. Larey Seat attempting to sit  on commode. Pain and left hip. Initial encounter. EXAM: LEFT FEMUR 2 VIEWS COMPARISON:  None. FINDINGS: Mild degenerative changes are noted in the left hip. There is no underlying fracture. Atherosclerotic calcifications are noted within the left femoral artery. The visualize left hemi pelvis is intact. IMPRESSION: 1. No acute abnormality. 2. Mild degenerative change. 3. Atherosclerosis. Electronically Signed   By: Marin Robertshristopher  Mattern M.D.   On: 07/20/2016 20:37    Procedures Procedures (including critical care time)  Medications Ordered in ED Medications  HYDROcodone-acetaminophen (NORCO/VICODIN) 5-325 MG per tablet 1 tablet (1 tablet Oral Given 07/20/16 1949)     Initial Impression / Assessment and Plan / ED Course  I have reviewed the triage vital signs and the nursing notes.  Pertinent labs & imaging results that were available during my care of the patient were reviewed by me and considered in my medical decision making (see chart for details).  Clinical Course     Is a 70 year old female past medical history  as above who presents after mechanical fall at home, any of left hip pain. Patient was ambulatory at home following her fall. Reported pain with weightbearing. On arrival she is afebrile, VSS. Exam significant for tenderness to palpation about the left hip. Patient is able to perform minimal range of motion of her knee and ankle. By mouth Norco given. X-rays of left hip and femur obtained and are negative for fracture. CBC and BMP obtained. Significant for stable chronic anemia. Mildly elevated BUN and creatinine is no other acute/right disturbances. On reevaluation patient reports her pain is significantly improved. She ambulated with her walker and gati is at her baseline per both patient and family. She denies significant pain with weightbearing. Discharged in stable condition. Advised to follow up with PCP next week for re-evaluation. Strict return precautions discussed. Patient and family report understanding and agreement with plan.  Patient seen and discussed with Dr. Hyacinth MeekerMiller, ED attending.   Final Clinical Impressions(s) / ED Diagnoses   Final diagnoses:  Fall, initial encounter  Contusion of hip and thigh, left, initial encounter    New Prescriptions Discharge Medication List as of 07/20/2016 10:15 PM       Isa RankinAnn B Sana Tessmer, MD 07/21/16 [id number]    Eber HongBrian Miller, MD 07/22/16 1113

## 2016-07-20 NOTE — ED Triage Notes (Signed)
Patient transported via [facility]. EMS reports the patient lives alone and fell after missing her chair while sitting down. Patient reports pain in her left hip but EMS observed no shortening or rotation and the patient was ambulatory on scene.

## 2016-07-20 NOTE — ED Notes (Signed)
Patient ambulated with little pain.  Patient ambulated slowly with walker but stated that this was better than when patient arrived and better than her normal.

## 2016-12-26 ENCOUNTER — Ambulatory Visit (INDEPENDENT_AMBULATORY_CARE_PROVIDER_SITE_OTHER): Payer: Medicare (Managed Care)

## 2016-12-26 ENCOUNTER — Encounter (INDEPENDENT_AMBULATORY_CARE_PROVIDER_SITE_OTHER): Payer: Self-pay | Admitting: Orthopaedic Surgery

## 2016-12-26 ENCOUNTER — Ambulatory Visit (INDEPENDENT_AMBULATORY_CARE_PROVIDER_SITE_OTHER): Payer: Medicare (Managed Care) | Admitting: Orthopaedic Surgery

## 2016-12-26 DIAGNOSIS — M1711 Unilateral primary osteoarthritis, right knee: Secondary | ICD-10-CM | POA: Diagnosis not present

## 2016-12-26 DIAGNOSIS — M25561 Pain in right knee: Secondary | ICD-10-CM | POA: Diagnosis not present

## 2016-12-26 DIAGNOSIS — G8929 Other chronic pain: Secondary | ICD-10-CM

## 2016-12-26 NOTE — Progress Notes (Signed)
Office Visit Note   Patient: [name]           Date of Birth: Nov 28, 1945           MRN: [id number] Visit Date: 12/26/2016              Requested by: Gordy SaversPeter F Kwiatkowski, MD [address] [place] , [place] [id number] PCP: Rogelia BogaKWIATKOWSKI,PETER FRANK, MD   Assessment & Plan: Visit Diagnoses:  1. Primary osteoarthritis of right knee     Plan: Overall impression is advanced degenerative joint disease of the right knee. Right knee injection was performed today. Patient tolerated well. Follow up with me as needed.  Follow-Up Instructions: Return if symptoms worsen or fail to improve.   Orders:  Orders Placed This Encounter  Procedures  . XR KNEE 3 VIEW RIGHT   No orders of the defined types were placed in this encounter.     Procedures: Large Joint Inj Date/Time: 12/26/2016 10:28 AM Performed by: Tarry KosXU, Jevante Hollibaugh M Authorized by: Tarry KosXU, Avamarie Crossley M   Consent Given by:  Patient Timeout: prior to procedure the correct patient, procedure, and site was verified   Indications:  Pain Location:  Knee Site:  R knee Prep: patient was prepped and draped in usual sterile fashion   Needle Size:  22 G Ultrasound Guidance: No   Fluoroscopic Guidance: No   Arthrogram: No   Patient tolerance:  Patient tolerated the procedure well with no immediate complications     Clinical Data: No additional findings.   Subjective: Chief Complaint  Patient presents with  . Right Knee - Pain    Patient is a 71 year old female comes in with right knee pain for months. She has had 9 previous strokes. She mainly ambulates in a wheelchair and sometimes cause severe pain and limited her ability to more active. She does not drive. She denies any injuries. Pain does not radiate.    Review of Systems  Constitutional: Negative.   HENT: Negative.   Eyes: Negative.   Respiratory: Negative.   Cardiovascular: Negative.   Endocrine: Negative.   Musculoskeletal: Negative.   Neurological:  Negative.   Hematological: Negative.   Psychiatric/Behavioral: Negative.   All other systems reviewed and are negative.    Objective: Vital Signs: There were no vitals taken for this visit.  Physical Exam  Constitutional: She is oriented to person, place, and time. She appears well-developed and well-nourished.  Pulmonary/Chest: Effort normal.  Neurological: She is alert and oriented to person, place, and time.  Skin: Skin is warm. Capillary refill takes less than 2 seconds.  Psychiatric: She has a normal mood and affect. Her behavior is normal. Judgment and thought content normal.  Nursing note and vitals reviewed.   Ortho Exam Right knee exam shows no joint effusion. She is painful with motion and he guards secondary to pain. Collaterals and cruciates are grossly intact. Specialty Comments:  No specialty comments available.  Imaging: Xr Knee 3 View Right  Result Date: 12/26/2016 Advanced degenerative joint disease    PMFS History: Patient Active Problem List   Diagnosis Date Noted  . Impaired glucose tolerance 12/14/2013  . Obesity 12/14/2013  . URI 10/25/2009  . Coronary atherosclerosis 08/29/2008  . Essential hypertension 04/16/2007  . ASTHMA 04/16/2007  . 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 History of cardiovascular disorder 04/16/2007   Past Medical History:  Diagnosis Date  . 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 ASTHMA 04/16/2007  . 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 CORONARY ARTERY DISEASE 08/29/2008  .  Environmental allergies   . Hyperlipidemia   . HYPERTENSION 04/16/2007  . URI 10/25/2009    No family history on file.  Past Surgical History:  Procedure Laterality Date  . CARDIAC CATHETERIZATION     Social History   Occupational History  . Not on file.   Social History Main Topics  . Smoking status: Never Smoker  . Smokeless tobacco: Never Used  . Alcohol use No  . Drug use: No  . Sexual activity: Not on file

## 2017-01-19 ENCOUNTER — Emergency Department (HOSPITAL_COMMUNITY)
Admission: EM | Admit: 2017-01-19 | Discharge: 2017-01-19 | Disposition: A | Discharge status: Home / Self Care | Payer: Medicare (Managed Care) | Attending: Emergency Medicine | Admitting: Emergency Medicine

## 2017-01-19 DIAGNOSIS — T781XXA Other adverse food reactions, not elsewhere classified, initial encounter: Secondary | ICD-10-CM | POA: Diagnosis not present

## 2017-01-19 DIAGNOSIS — Z91018 Allergy to other foods: Secondary | ICD-10-CM | POA: Diagnosis not present

## 2017-01-19 DIAGNOSIS — Z7982 Long term (current) use of aspirin: Secondary | ICD-10-CM | POA: Insufficient documentation

## 2017-01-19 DIAGNOSIS — J45909 Unspecified asthma, uncomplicated: Secondary | ICD-10-CM | POA: Insufficient documentation

## 2017-01-19 DIAGNOSIS — I2510 Atherosclerotic heart disease of native coronary artery without angina pectoris: Secondary | ICD-10-CM | POA: Insufficient documentation

## 2017-01-19 DIAGNOSIS — I10 Essential (primary) hypertension: Secondary | ICD-10-CM | POA: Diagnosis not present

## 2017-01-19 DIAGNOSIS — Z79899 Other long term (current) drug therapy: Secondary | ICD-10-CM | POA: Diagnosis not present

## 2017-01-19 MED ORDER — DEXAMETHASONE 4 MG PO TABS
10.0000 mg | ORAL_TABLET | Freq: Once | ORAL | Status: AC
  Administered 2017-01-19: 10 mg via ORAL
  Filled 2017-01-19: qty 2

## 2017-01-19 NOTE — ED Notes (Signed)
Bed: WA11 Expected date:  Expected time:  Means of arrival:  Comments: EMS allergic reaction 

## 2017-01-19 NOTE — ED Triage Notes (Addendum)
Per EMS, pt is allergic to milk and eggs and ate a frozen spaghetti dinner containing those ingredients. EMS reports that on arrival pt bottom lip was red and swollen. Swelling then progressed to right side of cheek and up to eye. EMS administered 50 mg benadryl IM. Pt is legally blind.

## 2017-01-19 NOTE — Discharge Instructions (Signed)
Take benadryl 50mg  3 x a day for the next two days.

## 2017-01-19 NOTE — ED Provider Notes (Signed)
[facility]-EMERGENCY DEPT Provider Note   CSN: [id number] Arrival date & time: 01/19/17  1442     History   Chief Complaint Chief Complaint  Patient presents with  . Allergic Reaction; Facial Swelling    HPI [name] is a 71 y.o. female.  71 yo F with a chief complaint of an allergic reaction. The patient had a frozen meal which she thinks included some things that she is allergic to. She has some itching to her face. Call 911 and there is noted to have some right-sided facial swelling. She was given IV Benadryl with significant improvement. On arrival to the ED the patient has no complaints. Denied shortness of breath and wheezing vomiting or diarrhea. Denied a sensation near-syncope.   The history is provided by the patient.  Allergic Reaction  Presenting symptoms: no difficulty breathing, no difficulty swallowing and no wheezing   Severity:  Mild Duration:  1 hour Prior allergic episodes:  Food/nut allergies Context: food   Relieved by:  Antihistamines Worsened by:  Nothing Ineffective treatments:  None tried   Past Medical History:  Diagnosis Date  . 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 ASTHMA 04/16/2007  . 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 CORONARY ARTERY DISEASE 08/29/2008  . Environmental allergies   . Hyperlipidemia   . HYPERTENSION 04/16/2007  . URI 10/25/2009    Patient Active Problem List   Diagnosis Date Noted  . Impaired glucose tolerance 12/14/2013  . Obesity 12/14/2013  . URI 10/25/2009  . Coronary atherosclerosis 08/29/2008  . Essential hypertension 04/16/2007  . ASTHMA 04/16/2007  . 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 History of cardiovascular disorder 04/16/2007    Past Surgical History:  Procedure Laterality Date  . CARDIAC CATHETERIZATION      OB History    No data available       Home Medications    Prior to Admission medications   Medication Sig Start Date End Date Taking? Authorizing Provider  acetaminophen (TYLENOL) 325 MG tablet Take  650 mg by mouth every 4 (four) hours as needed for mild pain.    [provider]  albuterol (PROVENTIL HFA;VENTOLIN HFA) 108 (90 BASE) MCG/ACT inhaler Inhale 2 puffs into the lungs every 6 (six) hours as needed for wheezing or shortness of breath. 03/20/15   Gordy SaversKwiatkowski, Peter F, MD  amLODipine (NORVASC) 10 MG tablet Take 1 tablet (10 mg total) by mouth daily. 03/01/15   Gordy SaversKwiatkowski, Peter F, MD  aspirin 325 MG tablet Take 325 mg by mouth daily.    [provider]  atorvastatin (LIPITOR) 80 MG tablet Take 1 tablet (80 mg total) by mouth daily. 02/09/15   Gordy SaversKwiatkowski, Peter F, MD  BREO ELLIPTA 100-25 MCG/INH AEPB  01/01/15   [provider]  hydrALAZINE (APRESOLINE) 50 MG tablet Take 1 tablet (50 mg total) by mouth 3 (three) times daily. 02/09/15   Gordy SaversKwiatkowski, Peter F, MD  loratadine (CLARITIN) 10 MG tablet Take 10 mg by mouth daily.    [provider]  Melatonin 3 MG TABS Take 3 mg by mouth at bedtime as needed (insomnia).    [provider]  metoprolol (LOPRESSOR) 100 MG tablet Take 1 tablet (100 mg total) by mouth 2 (two) times daily. 02/09/15   Gordy SaversKwiatkowski, Peter F, MD  naproxen (NAPROSYN) 500 MG tablet  01/11/15   [provider]  nystatin (MYCOSTATIN/NYSTOP) 100000 UNIT/GM POWD Apply 1 g topically every 8 (eight) hours.    [provider]  polyethylene glycol powder (GLYCOLAX/MIRALAX) powder Take 17 g  by mouth daily. Until daily soft stools  OTC 11/15/14   Harle Battiest, NP  sulfamethoxazole-trimethoprim (BACTRIM DS,SEPTRA DS) 800-160 MG per tablet Take 1 tablet by mouth 2 (two) times daily. 03/01/15   Gordy Savers, MD    Family History No family history on file.  Social History Social History  Substance Use Topics  . Smoking status: Never Smoker  . Smokeless tobacco: Never Used  . Alcohol use No     Allergies   Lisinopril; Zolpidem tartrate; and Eggs or egg-derived products   Review of Systems Review of Systems   Constitutional: Negative for chills and fever.  HENT: Positive for facial swelling. Negative for congestion, rhinorrhea and trouble swallowing.   Eyes: Negative for redness and visual disturbance.  Respiratory: Negative for shortness of breath and wheezing.   Cardiovascular: Negative for chest pain and palpitations.  Gastrointestinal: Negative for nausea and vomiting.  Genitourinary: Negative for dysuria and urgency.  Musculoskeletal: Negative for arthralgias and myalgias.  Skin: Negative for pallor and wound.  Neurological: Negative for dizziness and headaches.     Physical Exam Updated Vital Signs BP 137/75 (BP Location: Right Arm)   Pulse (!) 50   Temp 98.3 F (36.8 C) (Oral)   Resp 14   SpO2 97%   Physical Exam  Constitutional: She is oriented to person, place, and time. She appears well-developed and well-nourished. No distress.  HENT:  Head: Normocephalic and atraumatic.  Eyes: EOM are normal. Pupils are equal, round, and reactive to light.  Neck: Normal range of motion. Neck supple.  Cardiovascular: Normal rate and regular rhythm.  Exam reveals no gallop and no friction rub.   No murmur heard. Pulmonary/Chest: Effort normal. She has no wheezes. She has no rales.  Abdominal: Soft. She exhibits no distension and no mass. There is no tenderness. There is no guarding.  Musculoskeletal: She exhibits no edema or tenderness.  Neurological: She is alert and oriented to person, place, and time.  Skin: Skin is warm and dry. She is not diaphoretic.  Psychiatric: She has a normal mood and affect. Her behavior is normal.  Nursing note and vitals reviewed.    ED Treatments / Results  Labs (all labs ordered are listed, but only abnormal results are displayed) Labs Reviewed - No data to display  EKG  EKG Interpretation None       Radiology No results found.  Procedures Procedures (including critical care time)  Medications Ordered in ED Medications  dexamethasone  (DECADRON) tablet 10 mg (not administered)     Initial Impression / Assessment and Plan / ED Course  I have reviewed the triage vital signs and the nursing notes.  Pertinent labs & imaging results that were available during my care of the patient were reviewed by me and considered in my medical decision making (see chart for details).     71 yo F with a chief complaint of an allergic reaction. At this point is completely resolved in the ED. Will give a dose of Decadron her take Benadryl around the clock for the next couple days. PCP follow-up.  3:48 PM:  I have discussed the diagnosis/risks/treatment options with the patient and believe the pt to be eligible for discharge home to follow-up with PCP. We also discussed returning to the ED immediately if new or worsening sx occur. We discussed the sx which are most concerning (e.g., sudden worsening pain, fever, inability to tolerate by mouth) that necessitate immediate return. Medications administered to the patient during their  visit and any new prescriptions provided to the patient are listed below.  Medications given during this visit Medications  dexamethasone (DECADRON) tablet 10 mg (not administered)     The patient appears reasonably screen and/or stabilized for discharge and I doubt any other medical condition or other [facility] requiring further screening, evaluation, or treatment in the ED at this time prior to discharge.    Final Clinical Impressions(s) / ED Diagnoses   Final diagnoses:  Allergic reaction to food, initial encounter    New Prescriptions New Prescriptions   No medications on file     Melene Plan, DO 01/19/17 1549

## 2017-08-10 ENCOUNTER — Ambulatory Visit: Payer: Self-pay | Admitting: Allergy and Immunology

## 2017-09-11 ENCOUNTER — Encounter: Payer: Self-pay | Admitting: Allergy

## 2017-09-11 ENCOUNTER — Ambulatory Visit (INDEPENDENT_AMBULATORY_CARE_PROVIDER_SITE_OTHER): Payer: Medicare (Managed Care) | Admitting: Allergy

## 2017-09-11 VITALS — BP 130/86 | HR 61 | Ht 63.0 in | Wt 197.0 lb

## 2017-09-11 DIAGNOSIS — L501 Idiopathic urticaria: Secondary | ICD-10-CM

## 2017-09-11 DIAGNOSIS — T783XXA Angioneurotic edema, initial encounter: Secondary | ICD-10-CM

## 2017-09-11 DIAGNOSIS — Z91018 Allergy to other foods: Secondary | ICD-10-CM | POA: Diagnosis not present

## 2017-09-11 DIAGNOSIS — J4540 Moderate persistent asthma, uncomplicated: Secondary | ICD-10-CM | POA: Diagnosis not present

## 2017-09-11 NOTE — Patient Instructions (Addendum)
Swelling    - concerned for swelling not associated with rash as well as family history of swelling issues.   Will obtain labs to determine if you may have a hereditary form of angioedema (swelling).        - keep track of how often the swelling episodes are occurring     - may try use of antihistamine like Zyrtec, Allegra or Xyzal     Rash    - itchy rash that occurs rather infrequently may be hives    - will obtain environmental allergen profile as well as food allergy testing to egg and fish panel.       - as above recommend use of long acting antihistamine like Zyrtec, allegra or Xyzal  Food allergy     -  continue avoidance of stove top egg at this time    - have access to self-injectable epinephrine Epipen 0.3mg  at all times    - follow emergency action plan in case of allergic reaction    - will obtain fish and egg IgE levels  Asthma     - continue symbicort 2 puffs twice a day     - have access to albuterol inhaler 2 puffs every 4-6 hours as needed for cough/wheeze/shortness of breath/chest tightness.  May use 15-20 minutes prior to activity.   Monitor frequency of use.    Asthma control goals:   Full participation in all desired activities (may need albuterol before activity)  Albuterol use two time or less a week on average (not counting use with activity)  Cough interfering with sleep two time or less a month  Oral steroids no more than once a year  No hospitalizations  Follow-up 6 months or sooner if needed

## 2017-09-11 NOTE — Progress Notes (Signed)
New Patient Note  RE: [name] MRN: [id number] DOB: 02-Nov-1945 Date of Office Visit: 09/11/2017  Referring provider: Miguel Aschoff, MD Primary care provider: Miguel Aschoff, MD  Chief Complaint: swelling  History of present illness: [name] is a 73 y.o. female presenting today for consultation for angioedema. She presents alone today.    She has been having facial swelling involving her lips, eyes, tongue and jaw.  The swelling occurs about couple times a year.  She feels the last time she had swelling was around July 2018.   Swelling last couple days before resolving completely.    She states she has been having issues with swelling since she was a child.  She also reports one of her children also has issues with swelling episodes.  She denies any respiratory, GI or CV related symptoms with the swelling episodes.  Swelling is not usually accompanied by rash however she states she does get an itchy rash about once a year.      When she has had an itchy rash she reports it spreads across her body and the rash resolves in about a day.  She states she has had swelling of her arm when she had the rash in the past.  She sates the rash occurs about once a year and she has also been having this since she was a child.       She thought the symptoms were related to fish as she noted she has developed rash with fish but not with every time she eats fish.  She reports she loves fish.   She otherwhise does not know any other foods that she has noted symptoms with.  She does have an egg allergy and states that she avoids it as she has vomiting with ingestion.  She is okay to eat baked egg products without issue.   She states she has taking zyrtec in the past for seasonal allergy symptoms with relief.  Symptoms include nasal congestion and sneezing.   She has asthma and takes symbicort 2 puffs twice a day.  She is unable to tell me how often she uses albuterol or when/if she  has needed prednisone for steroids.     Per medication review she used to take lisinopril but this was discontinued.  She is not sure when she stopped this miedication.     Review of systems: Review of Systems  Constitutional: Negative for chills, fever and malaise/fatigue.  HENT: Negative for congestion, ear discharge, ear pain and nosebleeds.   Eyes: Negative for pain, discharge and redness.  Respiratory: Positive for cough. Negative for shortness of breath and wheezing.   Cardiovascular: Negative for chest pain.  Gastrointestinal: Negative for abdominal pain, constipation, diarrhea, nausea and vomiting.  Musculoskeletal: Negative for myalgias.  Skin: Negative for itching and rash.  Neurological: Negative for headaches.    All other systems negative unless noted above in HPI  Past medical history: Past Medical History:  Diagnosis Date  . 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 ASTHMA 04/16/2007  . 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 CORONARY ARTERY DISEASE 08/29/2008  . Environmental allergies   . Hyperlipidemia   . HYPERTENSION 04/16/2007  . Osteoarthritis   . URI 10/25/2009    Past surgical history: Past Surgical History:  Procedure Laterality Date  . CARDIAC CATHETERIZATION      Family history:  Family History  Problem Relation Age of Onset  . Asthma Mother   . Asthma Sister  Social history: She lives in an apartment with carpeting with gas heating and central cooling.  There are no pets in the home.  There is no concern for water damage or mildew or roaches in the home.  She is unemployed.  She has no smoke exposure.  She goes to a day facility 3 days a week.    Medication List: Allergies as of 09/11/2017      Reactions   Lisinopril Swelling   Zolpidem Tartrate    Other reaction(s): Agitation   Eggs Or Egg-derived Products    Upset stomach       Medication List        Accurate as of 09/11/17  5:14 PM. Always use your most recent med list.            acetaminophen 325 MG tablet Commonly known as:  TYLENOL Take 650 mg by mouth every 4 (four) hours as needed for mild pain.   albuterol 108 (90 Base) MCG/ACT inhaler Commonly known as:  PROVENTIL HFA;VENTOLIN HFA Inhale 2 puffs into the lungs every 6 (six) hours as needed for wheezing or shortness of breath.   amLODipine 10 MG tablet Commonly known as:  NORVASC Take 1 tablet (10 mg total) by mouth daily.   aspirin 325 MG tablet Take 325 mg by mouth daily.   atorvastatin 80 MG tablet Commonly known as:  LIPITOR Take 1 tablet (80 mg total) by mouth daily.   Melatonin 3 MG Tabs Take 3 mg by mouth at bedtime as needed (insomnia).   metoprolol tartrate 100 MG tablet Commonly known as:  LOPRESSOR Take 1 tablet (100 mg total) by mouth 2 (two) times daily.   polyethylene glycol powder powder Commonly known as:  GLYCOLAX/MIRALAX Take 17 g by mouth daily. Until daily soft stools  OTC       Known medication allergies: Allergies  Allergen Reactions  . Lisinopril Swelling  . Zolpidem Tartrate     Other reaction(s): Agitation  . Eggs Or Egg-Derived Products     Upset stomach      Physical examination: Blood pressure 130/86, pulse 61, height 5\' 3"  (1.6 m), weight 197 lb (89.4 kg), SpO2 97 %.  General: Alert, interactive, in no acute distress. HEENT: PERRLA, TMs pearly gray, turbinates mildly edematous without discharge, post-pharynx non erythematous. Neck: Supple without lymphadenopathy. Lungs: Mildly decreased breath sounds bilaterally without wheezing, rhonchi or rales. {no increased work of breathing. CV: Normal S1, S2 without murmurs. Abdomen: Nondistended, nontender. Skin: Warm and dry, without lesions or rashes. Extremities:  No clubbing, cyanosis or edema. Neuro:   Grossly intact.  Diagnositics/Labs:  Spirometry: FEV1: 1.07 L 58%, FVC: 1.36 L 57%.  Lung function is reduced.  She did not have much improvement status post bronchodilator  Assessment and plan:    Angioedema    - concerned for swelling not associated with rash as well as family history of swelling issues.   Will obtain labs to determine if you may have a hereditary form of angioedema (HAE).        - keep track of how often the swelling episodes are occurring     - may try use of antihistamine like Zyrtec, Allegra or Xyzal at onset of swelling.  If HAE labs are positive then antihistamines will not be effective.       Rash    - itchy rash that occurs periodically rather that last for a day before resolves seems most consistent with hives.      -  will obtain environmental allergen profile as well as food allergy testing to egg and fish panel.       - as above recommend use of long acting antihistamine like Zyrtec, allegra or Xyzal  Food allergy     -  continue avoidance of stove top egg at this time    - have access to self-injectable epinephrine Epipen 0.3mg  at all times    - follow emergency action plan in case of allergic reaction    - will obtain fish and egg IgE levels  Asthma     - continue symbicort 2 puffs twice a day     - have access to albuterol inhaler 2 puffs every 4-6 hours as needed for cough/wheeze/shortness of breath/chest tightness.  May use 15-20 minutes prior to activity.   Monitor frequency of use.    Asthma control goals:   Full participation in all desired activities (may need albuterol before activity)  Albuterol use two time or less a week on average (not counting use with activity)  Cough interfering with sleep two time or less a month  Oral steroids no more than once a year  No hospitalizations  Follow-up 6 months or sooner if needed   I appreciate the opportunity to take part in [name]'s care. Please do not hesitate to contact me with questions.  Sincerely,   Margo Aye, MD Allergy/Immunology [facility]

## 2017-09-21 LAB — IGE+ALLERGENS ZONE 2(30)
Alternaria Alternata IgE: <0.1 kU/L
Amer Sycamore IgE Qn: <0.1 kU/L
Aspergillus Fumigatus IgE: <0.1 kU/L
Bahia Grass IgE: <0.1 kU/L
Cat Dander IgE: 3.66 kU/L — AB
Cedar, Mountain IgE: <0.1 kU/L
Cladosporium Herbarum IgE: <0.1 kU/L
Cockroach, American IgE: 0.2 kU/L — AB
D Farinae IgE: 6.35 kU/L — AB
D Pteronyssinus IgE: 4.86 kU/L — AB
Dog Dander IgE: 2.34 kU/L — AB
Elm, American IgE: <0.1 kU/L
Hickory, White IgE: <0.1 kU/L
IgE (Immunoglobulin E), Serum: 387 IU/mL — ABNORMAL HIGH (ref 0–100)
Johnson Grass IgE: <0.1 kU/L
M001-IGE PENICILLIUM CHRYSOGEN: 0.11 kU/L — AB
Maple/Box Elder IgE: <0.1 kU/L
Mugwort IgE Qn: <0.1 kU/L
Pigweed, Rough IgE: <0.1 kU/L
Plantain, English IgE: <0.1 kU/L
Ragweed, Short IgE: <0.1 kU/L
Sheep Sorrel IgE Qn: <0.1 kU/L
White Mulberry IgE: <0.1 kU/L

## 2017-09-21 LAB — HEREDITARY ANGIOEDEMA
C2 Esterase Inhibitor, Serum: 44 mg/dL — ABNORMAL HIGH (ref 21–39)
COMPLEMENT C4, SERUM: 35 mg/dL (ref 14–44)

## 2017-09-21 LAB — ALLERGEN PROFILE, FOOD-FISH
Allergen Salmon IgE: <0.1 kU/L
Allergen Trout IgE: <0.1 kU/L
Allergen Walley Pike IgE: <0.1 kU/L
Halibut IgE: <0.1 kU/L
Tuna: <0.1 kU/L

## 2017-09-21 LAB — HAE INTERPRETATION:

## 2017-09-21 LAB — C1 ESTERASE INHIBITOR, FUNC

## 2017-09-21 LAB — F245-IGE EGG, WHOLE

## 2017-10-06 ENCOUNTER — Emergency Department (HOSPITAL_COMMUNITY): Payer: Medicare (Managed Care)

## 2017-10-06 ENCOUNTER — Other Ambulatory Visit: Payer: Self-pay

## 2017-10-06 ENCOUNTER — Emergency Department (HOSPITAL_COMMUNITY)
Admission: EM | Admit: 2017-10-06 | Discharge: 2017-10-06 | Disposition: A | Discharge status: Home / Self Care | Payer: Medicare (Managed Care) | Attending: Emergency Medicine | Admitting: Emergency Medicine

## 2017-10-06 ENCOUNTER — Encounter (HOSPITAL_COMMUNITY): Payer: Self-pay

## 2017-10-06 DIAGNOSIS — I10 Essential (primary) hypertension: Secondary | ICD-10-CM | POA: Diagnosis not present

## 2017-10-06 DIAGNOSIS — Z7982 Long term (current) use of aspirin: Secondary | ICD-10-CM | POA: Insufficient documentation

## 2017-10-06 DIAGNOSIS — R0602 Shortness of breath: Secondary | ICD-10-CM

## 2017-10-06 DIAGNOSIS — R0981 Nasal congestion: Secondary | ICD-10-CM | POA: Insufficient documentation

## 2017-10-06 DIAGNOSIS — J4541 Moderate persistent asthma with (acute) exacerbation: Secondary | ICD-10-CM | POA: Diagnosis not present

## 2017-10-06 DIAGNOSIS — J3489 Other specified disorders of nose and nasal sinuses: Secondary | ICD-10-CM | POA: Insufficient documentation

## 2017-10-06 DIAGNOSIS — I2510 Atherosclerotic heart disease of native coronary artery without angina pectoris: Secondary | ICD-10-CM | POA: Insufficient documentation

## 2017-10-06 DIAGNOSIS — Z8673 Personal history of transient ischemic attack (TIA), and cerebral infarction without residual deficits: Secondary | ICD-10-CM | POA: Diagnosis not present

## 2017-10-06 DIAGNOSIS — Z79899 Other long term (current) drug therapy: Secondary | ICD-10-CM | POA: Insufficient documentation

## 2017-10-06 DIAGNOSIS — R079 Chest pain, unspecified: Secondary | ICD-10-CM | POA: Diagnosis not present

## 2017-10-06 HISTORY — DX: Cerebral infarction, unspecified: I63.9

## 2017-10-06 LAB — COMPREHENSIVE METABOLIC PANEL
ALT: 26 U/L (ref 14–54)
ANION GAP: 11 (ref 5–15)
AST: 30 U/L (ref 15–41)
Albumin: 4 g/dL (ref 3.5–5.0)
Alkaline Phosphatase: 96 U/L (ref 38–126)
BILIRUBIN TOTAL: 0.6 mg/dL (ref 0.3–1.2)
BUN: 14 mg/dL (ref 6–20)
CHLORIDE: 103 mmol/L (ref 101–111)
CO2: 23 mmol/L (ref 22–32)
Calcium: 9.6 mg/dL (ref 8.9–10.3)
Creatinine, Ser: 1.19 mg/dL — ABNORMAL HIGH (ref 0.44–1.00)
GFR, EST AFRICAN AMERICAN: 53 mL/min — AB (ref >60)
GFR, EST NON AFRICAN AMERICAN: 45 mL/min — AB (ref >60)
Glucose, Bld: 120 mg/dL — ABNORMAL HIGH (ref 65–99)
POTASSIUM: 3.8 mmol/L (ref 3.5–5.1)
Sodium: 137 mmol/L (ref 135–145)
TOTAL PROTEIN: 6.9 g/dL (ref 6.5–8.1)

## 2017-10-06 LAB — I-STAT ARTERIAL BLOOD GAS, ED
Acid-base deficit: 1 mmol/L (ref 0.0–2.0)
Bicarbonate: 23.9 mmol/L (ref 20.0–28.0)
O2 SAT: 95 %
PCO2 ART: 39.5 mmHg (ref 32.0–48.0)
PH ART: 7.39 (ref 7.350–7.450)
PO2 ART: 78 mmHg — AB (ref 83.0–108.0)
Patient temperature: 98.6
TCO2: 25 mmol/L (ref 22–32)

## 2017-10-06 LAB — CBC WITH DIFFERENTIAL/PLATELET
BASOS ABS: 0 10*3/uL (ref 0.0–0.1)
Basophils Relative: 0 %
Eosinophils Absolute: 0.2 10*3/uL (ref 0.0–0.7)
Eosinophils Relative: 3 %
HEMATOCRIT: 35.8 % — AB (ref 36.0–46.0)
Hemoglobin: 11.2 g/dL — ABNORMAL LOW (ref 12.0–15.0)
LYMPHS PCT: 32 %
Lymphs Abs: 2.2 10*3/uL (ref 0.7–4.0)
MCH: 28.1 pg (ref 26.0–34.0)
MCHC: 31.3 g/dL (ref 30.0–36.0)
MCV: 89.9 fL (ref 78.0–100.0)
MONO ABS: 0.3 10*3/uL (ref 0.1–1.0)
MONOS PCT: 4 %
NEUTROS ABS: 4.4 10*3/uL (ref 1.7–7.7)
Neutrophils Relative %: 61 %
PLATELETS: 173 10*3/uL (ref 150–400)
RBC: 3.98 MIL/uL (ref 3.87–5.11)
RDW: 14.1 % (ref 11.5–15.5)
WBC: 7.1 10*3/uL (ref 4.0–10.5)

## 2017-10-06 MED ORDER — IPRATROPIUM BROMIDE 0.02 % IN SOLN
0.5000 mg | Freq: Once | RESPIRATORY_TRACT | Status: AC
  Administered 2017-10-06: 0.5 mg via RESPIRATORY_TRACT
  Filled 2017-10-06: qty 2.5

## 2017-10-06 MED ORDER — PREDNISONE 10 MG PO TABS: 40.0000 mg | ORAL_TABLET | Freq: Every day | ORAL | 0 refills | Status: AC

## 2017-10-06 MED ORDER — ALBUTEROL SULFATE HFA 108 (90 BASE) MCG/ACT IN AERS
4.0000 | INHALATION_SPRAY | RESPIRATORY_TRACT | Status: DC
  Administered 2017-10-06: 4 via RESPIRATORY_TRACT
  Filled 2017-10-06: qty 6.7

## 2017-10-06 MED ORDER — ALBUTEROL (5 MG/ML) CONTINUOUS INHALATION SOLN
10.0000 mg/h | INHALATION_SOLUTION | RESPIRATORY_TRACT | Status: DC
  Administered 2017-10-06: 10 mg/h via RESPIRATORY_TRACT
  Filled 2017-10-06: qty 20

## 2017-10-06 NOTE — ED Provider Notes (Addendum)
[facility] Provider Note  CSN: [id number] Arrival date & time: 10/06/17 [id number]  Chief Complaint(s) Shortness of Breath  HPI [name] is a 72 y.o. female with a history of hypertension, prior CVA, angioedema, and asthma who presents to the emergency department with several days of progressively worsening shortness of breath similar to her prior asthma exacerbations.  She reports that this has been accompanied by several days of rhinorrhea and nasal congestion.  She endorses sick contacts at home with similar symptoms.  Denies any fevers, swelling, nausea, vomiting, chest pain, abdominal pain.  She does endorse chest tightness that occurs with her asthma exacerbation.  Patient has been using her home inhalers but reports the last time she used her inhaler was yesterday morning.  Patient was brought in by EMS after she woke up with difficulty breathing.  They provided the patient with DuoNeb, and Solu-Medrol.  She is endorsing some improvement in her work of breathing with the treatment.  Denies any other alleviating or aggravating factors.  Denies any other physical complaints.  HPI  Past Medical History Past Medical History:  Diagnosis Date  . 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 ASTHMA 04/16/2007  . 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 CORONARY ARTERY DISEASE 08/29/2008  . Environmental allergies   . Hyperlipidemia   . HYPERTENSION 04/16/2007  . Osteoarthritis   . Stroke (HCC)   . URI 10/25/2009   Patient Active Problem List   Diagnosis Date Noted  . Impaired glucose tolerance 12/14/2013  . Obesity 12/14/2013  . URI 10/25/2009  . Coronary atherosclerosis 08/29/2008  . Essential hypertension 04/16/2007  . ASTHMA 04/16/2007  . 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 History of cardiovascular disorder 04/16/2007   Home Medication(s) Prior to Admission medications   Medication Sig Start Date End Date Taking? Authorizing Provider  acetaminophen (TYLENOL)  325 MG tablet Take 650 mg by mouth every 4 (four) hours as needed for mild pain.    [provider]  albuterol (PROVENTIL HFA;VENTOLIN HFA) 108 (90 BASE) MCG/ACT inhaler Inhale 2 puffs into the lungs every 6 (six) hours as needed for wheezing or shortness of breath. 03/20/15   Gordy SaversKwiatkowski, Peter F, MD  amLODipine (NORVASC) 10 MG tablet Take 1 tablet (10 mg total) by mouth daily. 03/01/15   Gordy SaversKwiatkowski, Peter F, MD  aspirin 325 MG tablet Take 325 mg by mouth daily.    [provider]  atorvastatin (LIPITOR) 80 MG tablet Take 1 tablet (80 mg total) by mouth daily. 02/09/15   Gordy SaversKwiatkowski, Peter F, MD  Melatonin 3 MG TABS Take 3 mg by mouth at bedtime as needed (insomnia).    [provider]  metoprolol (LOPRESSOR) 100 MG tablet Take 1 tablet (100 mg total) by mouth 2 (two) times daily. 02/09/15   Gordy SaversKwiatkowski, Peter F, MD  polyethylene glycol powder (GLYCOLAX/MIRALAX) powder Take 17 g by mouth daily. Until daily soft stools  OTC 11/15/14   Harle Battiestysinger, Elizabeth, NP  predniSONE (DELTASONE) 10 MG tablet Take 4 tablets (40 mg total) by mouth daily for 4 days. 10/06/17 10/10/17  Nira Connardama, Pedro Eduardo, MD  losartan-hydrochlorothiazide (HYZAAR) 100-25 MG per tablet Take 1 tablet by mouth daily. 11/03/11 11/14/11  Eulis FosterWebb, Padonda B, FNP  Past Surgical History Past Surgical History:  Procedure Laterality Date  . CARDIAC CATHETERIZATION     Family History Family History  Problem Relation Age of Onset  . Asthma Mother   . Asthma Sister     Social History Social History   Tobacco Use  . Smoking status: Never Smoker  . Smokeless tobacco: Never Used  Substance Use Topics  . Alcohol use: No  . Drug use: No   Allergies Lisinopril; Zolpidem tartrate; and Eggs or egg-derived products  Review of Systems Review of Systems All other systems are reviewed and  are negative for acute change except as noted in the HPI  Physical Exam Vital Signs  I have reviewed the triage vital signs BP 133/61   Pulse 83   Temp 97.9 F (36.6 C) (Oral)   Resp (!) 21   SpO2 100%   Physical Exam  Constitutional: She is oriented to person, place, and time. She appears well-developed and well-nourished. No distress.  HENT:  Head: Normocephalic and atraumatic.  Nose: Nose normal.  Eyes: Conjunctivae and EOM are normal. Pupils are equal, round, and reactive to light. Right eye exhibits no discharge. Left eye exhibits no discharge. No scleral icterus.  Neck: Normal range of motion. Neck supple.  Cardiovascular: Normal rate and regular rhythm. Exam reveals no gallop and no friction rub.  No murmur heard. Pulmonary/Chest: Effort normal. No stridor. Tachypnea noted. No respiratory distress. She has no rales.  Poor air movement with prolonged expiratory phase and faint expiratory wheezes diffusely  Abdominal: Soft. She exhibits no distension. There is no tenderness.  Musculoskeletal: She exhibits no edema or tenderness.  Neurological: She is alert and oriented to person, place, and time.  Skin: Skin is warm and dry. No rash noted. She is not diaphoretic. No erythema.  Psychiatric: She has a normal mood and affect.  Vitals reviewed.   ED Results and Treatments Labs (all labs ordered are listed, but only abnormal results are displayed) Labs Reviewed  CBC WITH DIFFERENTIAL/PLATELET - Abnormal; Notable for the following components:      Result Value   Hemoglobin 11.2 (*)    HCT 35.8 (*)    All other components within normal limits  COMPREHENSIVE METABOLIC PANEL - Abnormal; Notable for the following components:   Glucose, Bld 120 (*)    Creatinine, Ser 1.19 (*)    GFR calc non Af Amer 45 (*)    GFR calc Af Amer 53 (*)    All other components within normal limits  I-STAT ARTERIAL BLOOD GAS, ED - Abnormal; Notable for the following components:   pO2, Arterial  78.0 (*)    All other components within normal limits  BLOOD GAS, ARTERIAL                                                                                                                         EKG  EKG Interpretation  Date/Time:  Tuesday October 06 2017 07:11:17 EST Ventricular Rate:  70  PR Interval:    QRS Duration: 105 QT Interval:  402 QTC Calculation: 434 R Axis:   35 Text Interpretation:  Sinus rhythm Anteroseptal infarct, age indeterminate Baseline wander in lead(s) II III aVF V3 Artifact Otherwise no significant change Confirmed by Drema Pry [phone]) on 10/06/2017 7:24:48 AM      Radiology Ct Chest Wo Contrast  Result Date: 10/06/2017 CLINICAL DATA:  Shortness of breath, chest tightness EXAM: CT CHEST WITHOUT CONTRAST TECHNIQUE: Multidetector CT imaging of the chest was performed following the standard protocol without IV contrast. COMPARISON:  None. FINDINGS: Cardiovascular: Normal caliber thoracic aorta. Mild thoracic aortic atherosclerosis. Coronary artery atherosclerosis in the LAD, circumflex, left main and RCA. Coronary stent in the LAD. Normal heart size. No pericardial effusion. Mediastinum/Nodes: No enlarged mediastinal or axillary lymph nodes. Thyroid gland, trachea, and esophagus demonstrate no significant findings. Lungs/Pleura: Lungs are clear. No pleural effusion or pneumothorax. Mild lingular and right basilar atelectasis. Upper Abdomen: No acute abnormality. Musculoskeletal: No acute osseous abnormality. Thoracolumbar spine spondylosis. IMPRESSION: 1. No acute cardiopulmonary disease. 2.  Aortic Atherosclerosis (ICD10-I70.0). 3. Coronary artery atherosclerosis. Electronically Signed   By: Elige Ko   On: 10/06/2017 12:17   Dg Chest Port 1 View  Result Date: 10/06/2017 CLINICAL DATA:  72 year old female with history of shortness of breath. Asthma. Nonproductive cough. EXAM: PORTABLE CHEST 1 VIEW COMPARISON:  Chest x-ray 06/15/2011. FINDINGS: Lung volumes are  low. Bibasilar opacities (left greater than right) may reflect areas of atelectasis and/or airspace consolidation. Small left pleural effusion. No evidence of pulmonary edema. No pneumothorax. No suspicious appearing pulmonary nodules or masses. Heart size is normal. The patient is rotated to the right on today's exam, resulting in distortion of the mediastinal contours and reduced diagnostic sensitivity and specificity for mediastinal pathology. IMPRESSION: 1. Low lung volumes with bibasilar opacities which in large part reflect atelectasis. Underlying airspace consolidation in the left lower lobe is suspected, however, and there does appear to be a small left pleural effusion. Clinical correlation for signs and symptoms of pneumonia is recommended. Followup PA and lateral chest X-ray is recommended in 3-4 weeks following trial of antibiotic therapy to ensure resolution and exclude underlying malignancy. Electronically Signed   By: Trudie Reed M.D.   On: 10/06/2017 08:02   Pertinent labs & imaging results that were available during my care of the patient were reviewed by me and considered in my medical decision making (see chart for details).  Medications Ordered in ED Medications  albuterol (PROVENTIL,VENTOLIN) solution continuous neb (10 mg/hr Nebulization New Bag/Given 10/06/17 0741)  albuterol (PROVENTIL HFA;VENTOLIN HFA) 108 (90 Base) MCG/ACT inhaler 4 puff (not administered)  ipratropium (ATROVENT) nebulizer solution 0.5 mg (0.5 mg Nebulization Given 10/06/17 0741)                                                                                                                                    Procedures Procedures CRITICAL CARE  Performed by: Amadeo Garnet Cardama Total critical care time: 40 minutes Critical care time was exclusive of separately billable procedures and treating other patients. Critical care was necessary to treat or prevent imminent or life-threatening  deterioration. Critical care was time spent personally by me on the following activities: development of treatment plan with patient and/or surrogate as well as nursing, discussions with consultants, evaluation of patient's response to treatment, examination of patient, obtaining history from patient or surrogate, ordering and performing treatments and interventions, ordering and review of laboratory studies, ordering and review of radiographic studies, pulse oximetry and re-evaluation of patient's condition.   (including critical care time)  Medical Decision Making / ED Course I have reviewed the nursing notes for this encounter and the patient's prior records (if available in EHR or on provided paperwork).    Presentation is consistent with asthma exacerbation.  Patient provided with continuous breathing treatment.  Patient already receives steroids by EMS.  Chest x-ray suspicious for possible pneumonia but patient does not have any leukocytosis.  Following the breathing treatment patient has significant improvement in air movement and work of breathing.  She was satting well on room air without any supplemental oxygen.  She was monitored for several hours without worsening.  In that timeframe a CT of the chest was obtained to better characterize suspicious lung findings which revealed no evidence of pneumonia, pleural effusion, pulmonary edema.  The patient appears reasonably screened and/or stabilized for discharge and I doubt any other medical condition or other [facility] requiring further screening, evaluation, or treatment in the ED at this time prior to discharge.  The patient is safe for discharge with strict return precautions.   Final Clinical Impression(s) / ED Diagnoses Final diagnoses:  SOB (shortness of breath)  Moderate persistent asthma with exacerbation   Disposition: Discharge  Condition: Good  I have discussed the results, Dx and Tx plan with the patient who expressed  understanding and agree(s) with the plan. Discharge instructions discussed at great length. The patient was given strict return precautions who verbalized understanding of the instructions. No further questions at time of discharge.    ED Discharge Orders        Ordered    predniSONE (DELTASONE) 10 MG tablet  Daily     10/06/17 1355       Follow Up: Miguel Aschoff, MD [address] [place] [address] [phone]  In 1 day as scheduled      This chart was dictated using voice recognition software.  Despite best efforts to proofread,  errors can occur which can change the documentation meaning.     Nira Conn, MD 10/06/17 1355

## 2017-10-06 NOTE — ED Triage Notes (Signed)
Pt arrives to ED via EMS with c/o SOB x 1 day; Pt has hx of Asthma with non productive cough; Pt arrives with having 15 of Albuterol and 1 of Atrovent and 125 of Solumedrol. Pt is a&ox 4 on arrival. Pt denies pain on arrival. Pt has hx of stroke with right side arm and bilateral leg deficits; pt use wheelchair and walker at home; pt is from home with grandson;. Pt states she had recent fall this weekend but denies hurting herself.-Monique,RN

## 2018-02-08 ENCOUNTER — Other Ambulatory Visit: Payer: Self-pay

## 2018-02-08 ENCOUNTER — Encounter (HOSPITAL_COMMUNITY): Payer: Self-pay | Admitting: *Deleted

## 2018-02-08 ENCOUNTER — Emergency Department (HOSPITAL_COMMUNITY)
Admission: EM | Admit: 2018-02-08 | Discharge: 2018-02-08 | Disposition: A | Discharge status: Home / Self Care | Payer: Medicare (Managed Care) | Attending: Emergency Medicine | Admitting: Emergency Medicine

## 2018-02-08 DIAGNOSIS — I10 Essential (primary) hypertension: Secondary | ICD-10-CM | POA: Insufficient documentation

## 2018-02-08 DIAGNOSIS — I2510 Atherosclerotic heart disease of native coronary artery without angina pectoris: Secondary | ICD-10-CM | POA: Insufficient documentation

## 2018-02-08 DIAGNOSIS — J45909 Unspecified asthma, uncomplicated: Secondary | ICD-10-CM | POA: Diagnosis not present

## 2018-02-08 DIAGNOSIS — T783XXA Angioneurotic edema, initial encounter: Secondary | ICD-10-CM

## 2018-02-08 DIAGNOSIS — Z79899 Other long term (current) drug therapy: Secondary | ICD-10-CM | POA: Diagnosis not present

## 2018-02-08 MED ORDER — DIPHENHYDRAMINE HCL 50 MG/ML IJ SOLN
25.0000 mg | Freq: Once | INTRAMUSCULAR | Status: AC
  Administered 2018-02-08: 25 mg via INTRAVENOUS
  Filled 2018-02-08: qty 1

## 2018-02-08 MED ORDER — DIPHENHYDRAMINE HCL 25 MG PO TABS: 25.0000 mg | ORAL_TABLET | Freq: Four times a day (QID) | ORAL | 0 refills | Status: AC

## 2018-02-08 MED ORDER — PREDNISONE 10 MG PO TABS: 60.0000 mg | ORAL_TABLET | Freq: Every day | ORAL | 0 refills | Status: DC

## 2018-02-08 MED ORDER — FAMOTIDINE IN NACL 20-0.9 MG/50ML-% IV SOLN
20.0000 mg | Freq: Once | INTRAVENOUS | Status: AC
  Administered 2018-02-08: 20 mg via INTRAVENOUS
  Filled 2018-02-08: qty 50

## 2018-02-08 MED ORDER — EPINEPHRINE 0.3 MG/0.3ML IJ SOAJ: 0.3000 mg | Freq: Once | INTRAMUSCULAR | 0 refills | Status: AC

## 2018-02-08 MED ORDER — METHYLPREDNISOLONE SODIUM SUCC 125 MG IJ SOLR
125.0000 mg | Freq: Once | INTRAMUSCULAR | Status: AC
  Administered 2018-02-08: 125 mg via INTRAVENOUS
  Filled 2018-02-08: qty 2

## 2018-02-08 NOTE — Discharge Instructions (Addendum)
We saw you in the ER after you had the allergic reaction.  The reaction is severe, however, it appears to be in control and there is no increased swelling or any difficulty in breathing noted. We are not sure what caused the reaction, and it is important for you to follow up with an allergist. Please take the medications prescribed. PLEASE RETURN TO THE ER IMMEDIATELY IN CASE YOU START HAVING WORSENING SWELLING, DIFFICULTY IN BREATHING ETC.  

## 2018-02-08 NOTE — ED Provider Notes (Addendum)
[facility]   CSN: [id number] Arrival date & time: 02/08/18  [id number]     History   Chief Complaint Chief Complaint  Patient presents with  . Angioedema    HPI [name] is a 72 y.o. female.  HPI  72 year old female with history of ACE induced angioedema, CAD, hypertension and hyperlipidemia comes in with chief complaint of right-sided lip swelling.  Patient states that she went to bed feeling all right, however when she woke up she noticed that her top lip on the right side was swollen.  Patient did not have any problems after her dinner last night.  Patient denies any chest pain, shortness of breath, difficulty in breathing, difficulty in swallowing that is new -EMS however did note that patient had mild wheezing.  In addition to lisinopril, which was discontinued patient also is allergic to eggs and Ambien -however, as far as she knows she has not been exposed to either of those 2 agents.  Past Medical History:  Diagnosis Date  . 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 ASTHMA 04/16/2007  . 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 CORONARY ARTERY DISEASE 08/29/2008  . Environmental allergies   . Hyperlipidemia   . HYPERTENSION 04/16/2007  . Osteoarthritis   . Stroke (HCC)   . URI 10/25/2009    Patient Active Problem List   Diagnosis Date Noted  . Impaired glucose tolerance 12/14/2013  . Obesity 12/14/2013  . URI 10/25/2009  . Coronary atherosclerosis 08/29/2008  . Essential hypertension 04/16/2007  . ASTHMA 04/16/2007  . 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 History of cardiovascular disorder 04/16/2007    Past Surgical History:  Procedure Laterality Date  . CARDIAC CATHETERIZATION       OB History   None      Home Medications    Prior to Admission medications   Medication Sig Start Date End Date Taking? Authorizing Provider  acetaminophen (TYLENOL) 500 MG tablet Take 500-1,000 mg by mouth 3 (three) times daily as needed  for mild pain.    Yes [provider]  albuterol (ACCUNEB) 0.63 MG/3ML nebulizer solution Take 3 mLs by nebulization 3 (three) times daily as needed for wheezing or shortness of breath.   Yes [provider]  albuterol (PROVENTIL HFA;VENTOLIN HFA) 108 (90 BASE) MCG/ACT inhaler Inhale 2 puffs into the lungs every 6 (six) hours as needed for wheezing or shortness of breath. 03/20/15  Yes Gordy SaversKwiatkowski, Peter F, MD  amLODipine (NORVASC) 10 MG tablet Take 1 tablet (10 mg total) by mouth daily. 03/01/15  Yes Gordy SaversKwiatkowski, Peter F, MD  aspirin EC 81 MG tablet Take 81 mg by mouth daily.   Yes [provider]  atorvastatin (LIPITOR) 10 MG tablet Take 10 mg by mouth daily.   Yes [provider]  budesonide-formoterol (SYMBICORT) 80-4.5 MCG/ACT inhaler Inhale 2 puffs into the lungs 2 (two) times daily.   Yes [provider]  citalopram (CELEXA) 20 MG tablet Take 20 mg by mouth daily.   Yes [provider]  EPINEPHrine 0.3 mg/0.3 mL IJ SOAJ injection Inject 0.3 mg into the muscle See admin instructions. Inject once as needed for allergic reaction. May repeat dose x1 after 5-15 minutes if needed.   Yes [provider]  furosemide (LASIX) 40 MG tablet Take 40 mg by mouth 2 (two) times daily.   Yes [provider]  guaiFENesin (ROBITUSSIN) 100 MG/5ML SOLN Take 5 mLs by mouth every 4 (four) hours as needed for cough or to  loosen phlegm.   Yes [provider]  Melatonin 5 MG TABS Take 3 mg by mouth at bedtime as needed (insomnia).    Yes [provider]  Menthol, Topical Analgesic, (BIOFREEZE EX) Apply 1 application topically 3 (three) times daily as needed (joint pain).   Yes [provider]  metoprolol tartrate (LOPRESSOR) 50 MG tablet Take 75 mg by mouth See admin instructions. Take 1 1/2 tablets (75mg ) twice daily   Yes [provider]  sennosides-docusate sodium (SENOKOT-S) 8.6-50 MG tablet Take 1-2 tablets by  mouth 2 (two) times daily.   Yes [provider]  spironolactone (ALDACTONE) 50 MG tablet Take 50 mg by mouth daily.   Yes [provider]  atorvastatin (LIPITOR) 80 MG tablet Take 1 tablet (80 mg total) by mouth daily. Patient not taking: Reported on 02/08/2018 02/09/15   Gordy Savers, MD  metoprolol (LOPRESSOR) 100 MG tablet Take 1 tablet (100 mg total) by mouth 2 (two) times daily. Patient not taking: Reported on 02/08/2018 02/09/15   Gordy Savers, MD  polyethylene glycol powder [facility]) powder Take 17 g by mouth daily. Until daily soft stools  OTC Patient not taking: Reported on 02/08/2018 11/15/14   Harle Battiest, NP  losartan-hydrochlorothiazide (HYZAAR) 100-25 MG per tablet Take 1 tablet by mouth daily. 11/03/11 11/14/11  Eulis Foster, FNP    Family History Family History  Problem Relation Age of Onset  . Asthma Mother   . Asthma Sister     Social History Social History   Tobacco Use  . Smoking status: Never Smoker  . Smokeless tobacco: Never Used  Substance Use Topics  . Alcohol use: No  . Drug use: No     Allergies   Lisinopril; Zolpidem tartrate; and Eggs or egg-derived products   Review of Systems Review of Systems  Constitutional: Positive for activity change.  HENT: Positive for facial swelling. Negative for drooling and trouble swallowing.   Respiratory: Positive for wheezing. Negative for shortness of breath.   Cardiovascular: Negative for chest pain.  Allergic/Immunologic: Positive for environmental allergies and food allergies. Negative for immunocompromised state.  Hematological: Does not bruise/bleed easily.  All other systems reviewed and are negative.    Physical Exam Updated Vital Signs BP (!) 130/52   Pulse 64   Temp 98.4 F (36.9 C)   Resp 16   SpO2 96%   Physical Exam  Constitutional: She is oriented to person, place, and time. She appears well-developed.  HENT:  Head: Normocephalic and  atraumatic.  Upper lip, primarily on patient's right side is edematous. No tongue swelling, uvula deviation.  No drooling, slurred speech, hoarse voice or stridor.  Eyes: Pupils are equal, round, and reactive to light. Conjunctivae and EOM are normal.  Neck: Normal range of motion. Neck supple.  Cardiovascular: Normal rate, regular rhythm and normal heart sounds.  Pulmonary/Chest: Effort normal and breath sounds normal. No respiratory distress. She has no wheezes.  Abdominal: Soft. Bowel sounds are normal. She exhibits no distension. There is no tenderness.  Neurological: She is alert and oriented to person, place, and time.  Skin: Skin is warm and dry.  Nursing note and vitals reviewed.    ED Treatments / Results  Labs (all labs ordered are listed, but only abnormal results are displayed) Labs Reviewed - No data to display  EKG None  Radiology No results found.  Procedures Procedures (including critical care time)  Medications Ordered in ED Medications  methylPREDNISolone sodium succinate (SOLU-MEDROL) 125 mg/2  mL injection 125 mg (125 mg Intravenous Given 02/08/18 0741)  famotidine (PEPCID) IVPB 20 mg premix (0 mg Intravenous Stopped 02/08/18 0836)  diphenhydrAMINE (BENADRYL) injection 25 mg (25 mg Intravenous Given 02/08/18 0741)     Initial Impression / Assessment and Plan / ED Course  I have reviewed the triage vital signs and the nursing notes.  Pertinent labs & imaging results that were available during my care of the patient were reviewed by me and considered in my medical decision making (see chart for details).  Clinical Course as of Feb 08 1033  Mon Feb 08, 2018  1033 Pt reassessed. She looks well, the swelling has improved. Pt will be given food. Stable for d/c. We will d/c with epipen.   [AN]    Clinical Course User Index [AN] Derwood Kaplan, MD    72 year old female with history of stroke and ACE induced angioedema comes in with what appears to be  angioedema without her being on lisinopril or exposed to any known allergens.  At the moment patient is not demonstrating any acute respiratory compromise.  We will give her IV Solu-Medrol along with H1 and H2 blockers.  We will reassess the patient.  If she continues to be stable and the symptoms are not getting worse we will discharge patient.  Final Clinical Impressions(s) / ED Diagnoses   Final diagnoses:  Angioedema, initial encounter    ED Discharge Orders    None       Derwood Kaplan, MD 02/08/18 1610    Derwood Kaplan, MD 02/08/18 1036

## 2018-02-08 NOTE — ED Notes (Signed)
Daughter updated on phone. States she will come get pt when discharged

## 2018-02-08 NOTE — ED Triage Notes (Signed)
Pt called EMS this morning after noticing her top lip was swollen. Reports having [employer] at 5pm yesterday but has had the same food from there in the past without any problems. EMS reports slight wheeze noted to L upper lobe and attempted to give PO benadryl but pt has difficulty swallowing at baseline and was unable to swallow medication. 5mg  albuterol given enroute. Pt denies pain or worsening sob although noted to become more sob on exertion which she reports is her baseline

## 2018-05-10 ENCOUNTER — Telehealth: Payer: Self-pay | Admitting: *Deleted

## 2018-05-10 NOTE — Telephone Encounter (Signed)
[facility]- referring physician- calling because they never received notes from pt visit in January. Sent electronically.

## 2018-05-26 ENCOUNTER — Other Ambulatory Visit: Payer: Self-pay | Admitting: Internal Medicine

## 2018-05-26 DIAGNOSIS — Z1231 Encounter for screening mammogram for malignant neoplasm of breast: Secondary | ICD-10-CM

## 2018-06-25 ENCOUNTER — Ambulatory Visit
Admission: RE | Admit: 2018-06-25 | Discharge: 2018-06-25 | Disposition: A | Discharge status: Home / Self Care | Payer: Medicare (Managed Care) | Source: Ambulatory Visit | Attending: Internal Medicine | Admitting: Internal Medicine

## 2018-06-25 DIAGNOSIS — Z1231 Encounter for screening mammogram for malignant neoplasm of breast: Secondary | ICD-10-CM

## 2018-09-20 ENCOUNTER — Other Ambulatory Visit: Payer: Self-pay | Admitting: Gerontology

## 2018-09-20 ENCOUNTER — Ambulatory Visit
Admission: RE | Admit: 2018-09-20 | Discharge: 2018-09-20 | Disposition: A | Discharge status: Home / Self Care | Payer: Medicare (Managed Care) | Source: Ambulatory Visit | Attending: Gerontology | Admitting: Gerontology

## 2018-09-20 DIAGNOSIS — R0602 Shortness of breath: Secondary | ICD-10-CM

## 2019-02-17 ENCOUNTER — Other Ambulatory Visit: Payer: Self-pay | Admitting: Nurse Practitioner

## 2019-02-17 ENCOUNTER — Ambulatory Visit
Admission: RE | Admit: 2019-02-17 | Discharge: 2019-02-17 | Disposition: A | Discharge status: Home / Self Care | Payer: Medicare (Managed Care) | Source: Ambulatory Visit | Attending: Nurse Practitioner | Admitting: Nurse Practitioner

## 2019-02-17 DIAGNOSIS — M545 Low back pain, unspecified: Secondary | ICD-10-CM

## 2019-02-17 DIAGNOSIS — M25551 Pain in right hip: Secondary | ICD-10-CM

## 2019-02-17 DIAGNOSIS — M25552 Pain in left hip: Secondary | ICD-10-CM

## 2019-02-19 ENCOUNTER — Other Ambulatory Visit: Payer: Self-pay

## 2019-02-19 ENCOUNTER — Emergency Department (HOSPITAL_COMMUNITY)
Admission: EM | Admit: 2019-02-19 | Discharge: 2019-02-20 | Disposition: A | Discharge status: Home / Self Care | Payer: Medicare (Managed Care) | Attending: Emergency Medicine | Admitting: Emergency Medicine

## 2019-02-19 DIAGNOSIS — R600 Localized edema: Secondary | ICD-10-CM | POA: Insufficient documentation

## 2019-02-19 DIAGNOSIS — J45909 Unspecified asthma, uncomplicated: Secondary | ICD-10-CM | POA: Diagnosis not present

## 2019-02-19 DIAGNOSIS — I10 Essential (primary) hypertension: Secondary | ICD-10-CM | POA: Insufficient documentation

## 2019-02-19 DIAGNOSIS — R0602 Shortness of breath: Secondary | ICD-10-CM | POA: Diagnosis not present

## 2019-02-19 DIAGNOSIS — Z8673 Personal history of transient ischemic attack (TIA), and cerebral infarction without residual deficits: Secondary | ICD-10-CM | POA: Insufficient documentation

## 2019-02-19 DIAGNOSIS — R52 Pain, unspecified: Secondary | ICD-10-CM

## 2019-02-19 DIAGNOSIS — Z7982 Long term (current) use of aspirin: Secondary | ICD-10-CM | POA: Diagnosis not present

## 2019-02-19 DIAGNOSIS — M79604 Pain in right leg: Secondary | ICD-10-CM | POA: Diagnosis not present

## 2019-02-19 DIAGNOSIS — W07XXXA Fall from chair, initial encounter: Secondary | ICD-10-CM | POA: Diagnosis not present

## 2019-02-19 DIAGNOSIS — I2510 Atherosclerotic heart disease of native coronary artery without angina pectoris: Secondary | ICD-10-CM | POA: Insufficient documentation

## 2019-02-19 DIAGNOSIS — Z79899 Other long term (current) drug therapy: Secondary | ICD-10-CM | POA: Diagnosis not present

## 2019-02-19 DIAGNOSIS — M79605 Pain in left leg: Secondary | ICD-10-CM | POA: Diagnosis present

## 2019-02-19 DIAGNOSIS — W19XXXA Unspecified fall, initial encounter: Secondary | ICD-10-CM

## 2019-02-19 NOTE — ED Provider Notes (Addendum)
TIME SEEN: 11:49 PM  CHIEF COMPLAINT: Bilateral lower extremity pain  HPI: Patient is a 73 year old female with history of CAD, CVA, hypertension, hyperlipidemia, asthma who presents to the emergency department with EMS from home with complaints of bilateral leg pain.  History is limited as patient is a poor historian.  She states that about 2 weeks ago she fell trying to get into her chair.  States she fell onto her bottom.  Has had pain in her butt and both thighs since that time but progressively worsened today and she called EMS.  It appears she had x-rays of her bilateral hips and pelvis as well as her lumbar spine on 02/17/2019 which showed no acute abnormality.  She denies any new injury.  She has redness without warmth to her shins bilaterally which she states has been present for 2 weeks.  Her legs are also significantly swollen bilaterally which she states has been chronic for her since her stroke 2 years ago.  States she normally ambulates with a walker.  She does not have pain medication at home.  She denies any chest pain.  States she has chronic shortness of breath from her asthma which is unchanged.  ROS: See HPI Constitutional: no fever  Eyes: no drainage  ENT: no runny nose   Cardiovascular:  no chest pain  Resp: Chronic and unchanged SOB  GI: no vomiting GU: no dysuria Integumentary: no rash  Allergy: no hives  Musculoskeletal: Chronic bilateral leg swelling  Neurological: no slurred speech ROS otherwise negative  PAST MEDICAL HISTORY/PAST SURGICAL HISTORY:  Past Medical History:  Diagnosis Date  . 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 ASTHMA 04/16/2007  . 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 CORONARY ARTERY DISEASE 08/29/2008  . Environmental allergies   . Hyperlipidemia   . HYPERTENSION 04/16/2007  . Osteoarthritis   . Stroke (HCC)   . URI 10/25/2009    MEDICATIONS:  Prior to Admission medications   Medication Sig Start Date End Date Taking? Authorizing Provider   acetaminophen (TYLENOL) 500 MG tablet Take 500-1,000 mg by mouth 3 (three) times daily as needed for mild pain.     [provider]  albuterol (ACCUNEB) 0.63 MG/3ML nebulizer solution Take 3 mLs by nebulization 3 (three) times daily as needed for wheezing or shortness of breath.    [provider]  albuterol (PROVENTIL HFA;VENTOLIN HFA) 108 (90 BASE) MCG/ACT inhaler Inhale 2 puffs into the lungs every 6 (six) hours as needed for wheezing or shortness of breath. 03/20/15   Gordy SaversKwiatkowski, Peter F, MD  amLODipine (NORVASC) 10 MG tablet Take 1 tablet (10 mg total) by mouth daily. 03/01/15   Gordy SaversKwiatkowski, Peter F, MD  aspirin EC 81 MG tablet Take 81 mg by mouth daily.    [provider]  atorvastatin (LIPITOR) 10 MG tablet Take 10 mg by mouth daily.    [provider]  atorvastatin (LIPITOR) 80 MG tablet Take 1 tablet (80 mg total) by mouth daily. Patient not taking: Reported on 02/08/2018 02/09/15   Gordy SaversKwiatkowski, Peter F, MD  budesonide-formoterol [facility](SYMBICORT) 80-4.5 MCG/ACT inhaler Inhale 2 puffs into the lungs 2 (two) times daily.    [provider]  citalopram (CELEXA) 20 MG tablet Take 20 mg by mouth daily.    [provider]  diphenhydrAMINE (BENADRYL) 25 MG tablet Take 1 tablet (25 mg total) by mouth every 6 (six) hours. 02/08/18   Derwood KaplanNanavati, Ankit, MD  EPINEPHrine 0.3 mg/0.3 mL IJ SOAJ injection Inject 0.3 mg into the muscle See  admin instructions. Inject once as needed for allergic reaction. May repeat dose x1 after 5-15 minutes if needed.    [provider]  furosemide (LASIX) 40 MG tablet Take 40 mg by mouth 2 (two) times daily.    [provider]  guaiFENesin (ROBITUSSIN) 100 MG/5ML SOLN Take 5 mLs by mouth every 4 (four) hours as needed for cough or to loosen phlegm.    [provider]  Melatonin 5 MG TABS Take 3 mg by mouth at bedtime as needed (insomnia).     [provider]  Menthol, Topical Analgesic,  (BIOFREEZE EX) Apply 1 application topically 3 (three) times daily as needed (joint pain).    [provider]  metoprolol (LOPRESSOR) 100 MG tablet Take 1 tablet (100 mg total) by mouth 2 (two) times daily. Patient not taking: Reported on 02/08/2018 02/09/15   Gordy SaversKwiatkowski, Peter F, MD  metoprolol tartrate (LOPRESSOR) 50 MG tablet Take 75 mg by mouth See admin instructions. Take 1 1/2 tablets (75mg ) twice daily    [provider]  polyethylene glycol powder (GLYCOLAX/MIRALAX) powder Take 17 g by mouth daily. Until daily soft stools  OTC Patient not taking: Reported on 02/08/2018 11/15/14   Harle Battiestysinger, Elizabeth, NP  predniSONE (DELTASONE) 10 MG tablet Take 6 tablets (60 mg total) by mouth daily. 02/08/18   Derwood KaplanNanavati, Ankit, MD  sennosides-docusate sodium (SENOKOT-S) 8.6-50 MG tablet Take 1-2 tablets by mouth 2 (two) times daily.    [provider]  spironolactone (ALDACTONE) 50 MG tablet Take 50 mg by mouth daily.    [provider]  losartan-hydrochlorothiazide (HYZAAR) 100-25 MG per tablet Take 1 tablet by mouth daily. 11/03/11 11/14/11  Eulis FosterWebb, Padonda B, FNP    ALLERGIES:  Allergies  Allergen Reactions  . Lisinopril Swelling  . Zolpidem Tartrate     Other reaction(s): Agitation  . Eggs Or Egg-Derived Products     Upset stomach     SOCIAL HISTORY:  Social History   Tobacco Use  . Smoking status: Never Smoker  . Smokeless tobacco: Never Used  Substance Use Topics  . Alcohol use: No    FAMILY HISTORY: Family History  Problem Relation Age of Onset  . Asthma Mother   . Asthma Sister     EXAM: BP (!) 158/130 (BP Location: Right Arm)   Pulse (!) 58   Temp 98.4 F (36.9 C)   Resp (!) 22   SpO2 95%  CONSTITUTIONAL: Alert and oriented x4 but is a poor historian.  Obese.  Elderly.  Appears uncomfortable. HEAD: Normocephalic; atraumatic EYES: Conjunctivae clear, PERRL, EOMI ENT: normal nose; no rhinorrhea; moist mucous membranes; pharynx without  lesions noted; no dental injury; no septal hematoma NECK: Supple, no meningismus, no LAD; no midline spinal tenderness, step-off or deformity; trachea midline CARD: RRR; S1 and S2 appreciated; no murmurs, no clicks, no rubs, no gallops RESP: Normal chest excursion without splinting or tachypnea; breath sounds clear and equal bilaterally; no wheezes, no rhonchi, no rales; no hypoxia or respiratory distress CHEST:  chest wall stable, no crepitus or ecchymosis or deformity, nontender to palpation; no flail chest ABD/GI: Normal bowel sounds; non-distended; soft, non-tender, no rebound, no guarding; no ecchymosis or other lesions noted PELVIS:  stable, nontender to palpation BACK:  The back appears normal and is non-tender to palpation, there is no CVA tenderness; no midline spinal tenderness, step-off or deformity EXT: Patient is very tender to palpation over her coccyx, posterior and anterior hips bilaterally without leg length discrepancy or deformity, bilateral  femurs and bilateral anterior knees.  Both lower extremities have 3+ pitting edema which she reports is chronic.  She has erythema of her shins bilaterally without warmth.  She has 2+ DP pulses palpated on exam bilaterally.  She has a hard time lifting her legs off the bed secondary to pain but I am able to passively range her hips and she cries out in pain.  She reports normal sensation throughout both legs.  No tenderness of her upper extremities. SKIN: Normal color for age and race; warm NEURO: Moves all extremities equally PSYCH: The patient's mood and manner are appropriate. Grooming and personal hygiene are appropriate.  MEDICAL DECISION MAKING: Patient here with bilateral leg pain.  States this occurred after a fall 2 weeks ago.  It appears she had x-rays of her lumbar spine, bilateral hips and pelvis which showed no acute abnormality yesterday.  Will obtain x-ray of bilateral femurs, knees.  Will check labs today.  She does have redness in  her shins that she states has been there for 2 weeks without warmth and no fever.  This is likely changes from venous stasis dermatitis rather than cellulitis.  She does appear volume overloaded but states this is chronic.  She denies any shortness of breath and is not hypoxic here.  She denies any chest pain.  Will give fentanyl for pain and reassess.  ED PROGRESS: Patient's x-ray showed no acute abnormalities.  Her labs are unremarkable.  No leukocytosis.  Normal BNP.  No renal failure.  Patient's x-ray showed no acute abnormality.  Nurse reports that patient complains of pain anywhere you touch her.  I have asked her if she would like more pain medication which she denies.  We will attempt to ambulate her in the ED with a walker.  She states she normally ambulates with a walker at home.  3:10 AM  Patient is able to ambulate with a walker.  She states it is painful but she can do it.  She states she does have someone that comes to her house to check on her and help her with things.  She states that they do not come every day.  She otherwise lives at home alone.  She has no medical condition present at this time that would require admission to the hospital.  She states she feels comfortable with a plan to discharge home with pain medication.  Have talked to her about whether or not she feels safe at home and the options of staying here in the morning to see case management, social work and physical therapy for possible rehab placement.  She states she does not want placement into a nursing facility or temporary rehab facility and would prefer to go home.  I will place a consult for social work and case management just to make sure that we are optimizing her home health care resources.  We will send prescription of pain medication to her pharmacy.  3:45 AM  I have called daughter at [phone].  Received a message that this person has not yet set up with her voicemail box.  At this time, I do not feel  there is any life-threatening condition present. I have reviewed and discussed all results (EKG, imaging, lab, urine as appropriate) and exam findings with patient/family. I have reviewed nursing notes and appropriate previous records.  I feel the patient is safe to be discharged home without further emergent workup and can continue workup as an outpatient as needed. Discussed usual and customary  return precautions. Patient/family verbalize understanding and are comfortable with this plan.  Outpatient follow-up has been provided as needed. All questions have been answered.      4:50 AM  Pt refuses PTAR.  We have attempted to get in touch with her daughter several times without success.  Patient states she would like to go home.  She feels comfortable taking a cab home.  She says she has the keys to her house and feels comfortable getting up and walking on her own.  We have again offered to allow her to stay in the emergency department to see social work, case management, physical therapy which she has declined.  She is adamant that she does not want placement.    Maday Guarino, Delice Bison, DO 02/20/19 0411    Salvatore Shear, Delice Bison, DO 02/20/19 [phone]

## 2019-02-19 NOTE — ED Triage Notes (Signed)
Pt coming from home with complaints of right hip and leg pain after having fall at home the evening of 26th. Pt was able to ambulate all day today and had no pain until approx 3 hours ago. No obvious deformities noted. Pt slid out of recliner onto bottom, did not hit head, and was able to pick herself back up yesterday when she fell. Pt on ASA daily

## 2019-02-20 ENCOUNTER — Emergency Department (HOSPITAL_COMMUNITY): Payer: Medicare (Managed Care)

## 2019-02-20 LAB — BASIC METABOLIC PANEL
Anion gap: 10 (ref 5–15)
BUN: 20 mg/dL (ref 8–23)
CO2: 30 mmol/L (ref 22–32)
Calcium: 9.3 mg/dL (ref 8.9–10.3)
Chloride: 99 mmol/L (ref 98–111)
Creatinine, Ser: 1.19 mg/dL — ABNORMAL HIGH (ref 0.44–1.00)
GFR calc Af Amer: 54 mL/min — ABNORMAL LOW (ref >60)
GFR calc non Af Amer: 46 mL/min — ABNORMAL LOW (ref >60)
Glucose, Bld: 104 mg/dL — ABNORMAL HIGH (ref 70–99)
Potassium: 3.7 mmol/L (ref 3.5–5.1)
Sodium: 139 mmol/L (ref 135–145)

## 2019-02-20 LAB — CBC WITH DIFFERENTIAL/PLATELET
Abs Immature Granulocytes: 0 10*3/uL (ref 0.00–0.07)
Basophils Absolute: 0 10*3/uL (ref 0.0–0.1)
Basophils Relative: 1 %
Eosinophils Absolute: 0.3 10*3/uL (ref 0.0–0.5)
Eosinophils Relative: 6 %
HCT: 33.9 % — ABNORMAL LOW (ref 36.0–46.0)
Hemoglobin: 10.5 g/dL — ABNORMAL LOW (ref 12.0–15.0)
Immature Granulocytes: 0 %
Lymphocytes Relative: 28 %
Lymphs Abs: 1.4 10*3/uL (ref 0.7–4.0)
MCH: 27.6 pg (ref 26.0–34.0)
MCHC: 31 g/dL (ref 30.0–36.0)
MCV: 89.2 fL (ref 80.0–100.0)
Monocytes Absolute: 0.4 10*3/uL (ref 0.1–1.0)
Monocytes Relative: 9 %
Neutro Abs: 2.8 10*3/uL (ref 1.7–7.7)
Neutrophils Relative %: 56 %
Platelets: 190 10*3/uL (ref 150–400)
RBC: 3.8 MIL/uL — ABNORMAL LOW (ref 3.87–5.11)
RDW: 14.8 % (ref 11.5–15.5)
WBC: 4.9 10*3/uL (ref 4.0–10.5)
nRBC: 0 % (ref 0.0–0.2)

## 2019-02-20 LAB — BRAIN NATRIURETIC PEPTIDE: B Natriuretic Peptide: 28.2 pg/mL (ref 0.0–100.0)

## 2019-02-20 MED ORDER — HYDROCODONE-ACETAMINOPHEN 5-325 MG PO TABS: 1.0000 | ORAL_TABLET | ORAL | 0 refills | Status: DC | PRN

## 2019-02-20 MED ORDER — ONDANSETRON HCL 4 MG/2ML IJ SOLN
4.0000 mg | Freq: Once | INTRAMUSCULAR | Status: AC
  Administered 2019-02-20: 4 mg via INTRAVENOUS
  Filled 2019-02-20: qty 2

## 2019-02-20 MED ORDER — FENTANYL CITRATE (PF) 100 MCG/2ML IJ SOLN
50.0000 ug | Freq: Once | INTRAMUSCULAR | Status: AC
  Administered 2019-02-20: 50 ug via INTRAVENOUS
  Filled 2019-02-20: qty 2

## 2019-02-20 MED ORDER — HYDROCODONE-ACETAMINOPHEN 5-325 MG PO TABS
1.0000 | ORAL_TABLET | Freq: Once | ORAL | Status: AC
  Administered 2019-02-20: 1 via ORAL
  Filled 2019-02-20: qty 1

## 2019-02-20 MED ORDER — DOCUSATE SODIUM 100 MG PO CAPS: 100.0000 mg | ORAL_CAPSULE | Freq: Two times a day (BID) | ORAL | 0 refills | Status: DC

## 2019-02-20 NOTE — ED Notes (Addendum)
Pt assisted out of bed and was given a walker which she uses at home to ambulate. Pt ambulated from room 25 to the nurses desk. Pt spoke with Dr Leonides Schanz while ambulating to the nurses desk. Pt denies any sob but is still complaining of hip and leg pain. Pt then ambulates back to room 25 where she is assisted back into bed and hooked up to the monitor and given a warm blanket.

## 2019-02-20 NOTE — Discharge Instructions (Signed)
X-rays of your lower back, pelvis, hips, thighs, knees were normal.  I have prescribed Vicodin for pain control that I have sent to your pharmacy.  Please follow-up with your primary care doctor if symptoms continue.  I have also consulted case management and social work who will contact you at home to see if there is any resources they can help you with at home.  We have also offered the possibility of placement in a rehab facility which you have declined.  If you feel you are unable to care for yourself at home, please contact your primary care doctor.

## 2019-02-20 NOTE — ED Notes (Signed)
Able to get in touch with pt's daughter Janace Hoard who is going to pick pt up.

## 2019-02-20 NOTE — ED Notes (Signed)
Attempted to contact pt's daughter numerous times; will continue to try

## 2019-02-27 ENCOUNTER — Emergency Department (HOSPITAL_COMMUNITY): Payer: Medicare (Managed Care)

## 2019-02-27 ENCOUNTER — Other Ambulatory Visit: Payer: Self-pay

## 2019-02-27 ENCOUNTER — Inpatient Hospital Stay (HOSPITAL_COMMUNITY)
Admission: EM | Admit: 2019-02-27 | Discharge: 2019-03-04 | DRG: 565 | Disposition: A | Discharge status: Skilled Nursing Facility | Payer: Medicare (Managed Care) | Attending: Internal Medicine | Admitting: Internal Medicine

## 2019-02-27 DIAGNOSIS — M79672 Pain in left foot: Secondary | ICD-10-CM | POA: Diagnosis present

## 2019-02-27 DIAGNOSIS — Z7951 Long term (current) use of inhaled steroids: Secondary | ICD-10-CM

## 2019-02-27 DIAGNOSIS — Z888 Allergy status to other drugs, medicaments and biological substances status: Secondary | ICD-10-CM

## 2019-02-27 DIAGNOSIS — I2510 Atherosclerotic heart disease of native coronary artery without angina pectoris: Secondary | ICD-10-CM | POA: Diagnosis present

## 2019-02-27 DIAGNOSIS — Z1159 Encounter for screening for other viral diseases: Secondary | ICD-10-CM | POA: Diagnosis not present

## 2019-02-27 DIAGNOSIS — R748 Abnormal levels of other serum enzymes: Secondary | ICD-10-CM | POA: Diagnosis present

## 2019-02-27 DIAGNOSIS — W19XXXA Unspecified fall, initial encounter: Secondary | ICD-10-CM | POA: Diagnosis not present

## 2019-02-27 DIAGNOSIS — T796XXA Traumatic ischemia of muscle, initial encounter: Principal | ICD-10-CM | POA: Diagnosis present

## 2019-02-27 DIAGNOSIS — E7030 Albinism, unspecified: Secondary | ICD-10-CM | POA: Diagnosis present

## 2019-02-27 DIAGNOSIS — M6282 Rhabdomyolysis: Secondary | ICD-10-CM | POA: Diagnosis present

## 2019-02-27 DIAGNOSIS — W1830XA Fall on same level, unspecified, initial encounter: Secondary | ICD-10-CM | POA: Diagnosis present

## 2019-02-27 DIAGNOSIS — M79671 Pain in right foot: Secondary | ICD-10-CM | POA: Diagnosis not present

## 2019-02-27 DIAGNOSIS — Z7952 Long term (current) use of systemic steroids: Secondary | ICD-10-CM | POA: Diagnosis not present

## 2019-02-27 DIAGNOSIS — E785 Hyperlipidemia, unspecified: Secondary | ICD-10-CM | POA: Diagnosis present

## 2019-02-27 DIAGNOSIS — Y92000 Kitchen of unspecified non-institutional (private) residence as  the place of occurrence of the external cause: Secondary | ICD-10-CM | POA: Diagnosis not present

## 2019-02-27 DIAGNOSIS — Z9181 History of falling: Secondary | ICD-10-CM | POA: Diagnosis not present

## 2019-02-27 DIAGNOSIS — F329 Major depressive disorder, single episode, unspecified: Secondary | ICD-10-CM | POA: Diagnosis present

## 2019-02-27 DIAGNOSIS — M25551 Pain in right hip: Secondary | ICD-10-CM | POA: Diagnosis not present

## 2019-02-27 DIAGNOSIS — H3530 Unspecified macular degeneration: Secondary | ICD-10-CM | POA: Diagnosis present

## 2019-02-27 DIAGNOSIS — Z7982 Long term (current) use of aspirin: Secondary | ICD-10-CM

## 2019-02-27 DIAGNOSIS — E041 Nontoxic single thyroid nodule: Secondary | ICD-10-CM | POA: Diagnosis present

## 2019-02-27 DIAGNOSIS — Z79899 Other long term (current) drug therapy: Secondary | ICD-10-CM

## 2019-02-27 DIAGNOSIS — J45909 Unspecified asthma, uncomplicated: Secondary | ICD-10-CM | POA: Diagnosis present

## 2019-02-27 DIAGNOSIS — N183 Chronic kidney disease, stage 3 (moderate): Secondary | ICD-10-CM | POA: Diagnosis present

## 2019-02-27 DIAGNOSIS — M1990 Unspecified osteoarthritis, unspecified site: Secondary | ICD-10-CM | POA: Diagnosis present

## 2019-02-27 DIAGNOSIS — Z8673 Personal history of transient ischemic attack (TIA), and cerebral infarction without residual deficits: Secondary | ICD-10-CM

## 2019-02-27 DIAGNOSIS — I129 Hypertensive chronic kidney disease with stage 1 through stage 4 chronic kidney disease, or unspecified chronic kidney disease: Secondary | ICD-10-CM | POA: Diagnosis present

## 2019-02-27 DIAGNOSIS — Z91012 Allergy to eggs: Secondary | ICD-10-CM | POA: Diagnosis not present

## 2019-02-27 DIAGNOSIS — H547 Unspecified visual loss: Secondary | ICD-10-CM | POA: Diagnosis not present

## 2019-02-27 DIAGNOSIS — Z825 Family history of asthma and other chronic lower respiratory diseases: Secondary | ICD-10-CM

## 2019-02-27 LAB — CBC WITH DIFFERENTIAL/PLATELET
Abs Immature Granulocytes: 0.01 10*3/uL (ref 0.00–0.07)
Basophils Absolute: 0 10*3/uL (ref 0.0–0.1)
Basophils Relative: 1 %
Eosinophils Absolute: 0.1 10*3/uL (ref 0.0–0.5)
Eosinophils Relative: 1 %
HCT: 38.7 % (ref 36.0–46.0)
Hemoglobin: 11.7 g/dL — ABNORMAL LOW (ref 12.0–15.0)
Immature Granulocytes: 0 %
Lymphocytes Relative: 16 %
Lymphs Abs: 1 10*3/uL (ref 0.7–4.0)
MCH: 27.6 pg (ref 26.0–34.0)
MCHC: 30.2 g/dL (ref 30.0–36.0)
MCV: 91.3 fL (ref 80.0–100.0)
Monocytes Absolute: 0.5 10*3/uL (ref 0.1–1.0)
Monocytes Relative: 8 %
Neutro Abs: 4.6 10*3/uL (ref 1.7–7.7)
Neutrophils Relative %: 74 %
Platelets: 228 10*3/uL (ref 150–400)
RBC: 4.24 MIL/uL (ref 3.87–5.11)
RDW: 14.8 % (ref 11.5–15.5)
WBC: 6.1 10*3/uL (ref 4.0–10.5)
nRBC: 0 % (ref 0.0–0.2)

## 2019-02-27 LAB — URINALYSIS, ROUTINE W REFLEX MICROSCOPIC
Bilirubin Urine: NEGATIVE
Glucose, UA: NEGATIVE mg/dL
Ketones, ur: 5 mg/dL — AB
Nitrite: NEGATIVE
Protein, ur: 30 mg/dL — AB
Specific Gravity, Urine: 1.014 (ref 1.005–1.030)
pH: 6 (ref 5.0–8.0)

## 2019-02-27 LAB — CBC
HCT: 31.9 % — ABNORMAL LOW (ref 36.0–46.0)
Hemoglobin: 10 g/dL — ABNORMAL LOW (ref 12.0–15.0)
MCH: 27.9 pg (ref 26.0–34.0)
MCHC: 31.3 g/dL (ref 30.0–36.0)
MCV: 89.1 fL (ref 80.0–100.0)
Platelets: 192 10*3/uL (ref 150–400)
RBC: 3.58 MIL/uL — ABNORMAL LOW (ref 3.87–5.11)
RDW: 14.7 % (ref 11.5–15.5)
WBC: 6.5 10*3/uL (ref 4.0–10.5)
nRBC: 0 % (ref 0.0–0.2)

## 2019-02-27 LAB — BASIC METABOLIC PANEL
Anion gap: 10 (ref 5–15)
Anion gap: 10 (ref 5–15)
BUN: 12 mg/dL (ref 8–23)
BUN: 12 mg/dL (ref 8–23)
CO2: 28 mmol/L (ref 22–32)
CO2: 27 mmol/L (ref 22–32)
Calcium: 9.3 mg/dL (ref 8.9–10.3)
Calcium: 8.9 mg/dL (ref 8.9–10.3)
Chloride: 102 mmol/L (ref 98–111)
Chloride: 104 mmol/L (ref 98–111)
Creatinine, Ser: 0.96 mg/dL (ref 0.44–1.00)
Creatinine, Ser: 0.96 mg/dL (ref 0.44–1.00)
GFR calc Af Amer: >60 mL/min (ref >60)
GFR calc Af Amer: >60 mL/min (ref >60)
GFR calc non Af Amer: 60 mL/min — ABNORMAL LOW (ref >60)
GFR calc non Af Amer: 60 mL/min — ABNORMAL LOW (ref >60)
Glucose, Bld: 119 mg/dL — ABNORMAL HIGH (ref 70–99)
Glucose, Bld: 101 mg/dL — ABNORMAL HIGH (ref 70–99)
Potassium: 4.3 mmol/L (ref 3.5–5.1)
Potassium: 3.6 mmol/L (ref 3.5–5.1)
Sodium: 140 mmol/L (ref 135–145)
Sodium: 141 mmol/L (ref 135–145)

## 2019-02-27 LAB — SARS CORONAVIRUS 2 BY RT PCR (HOSPITAL ORDER, PERFORMED IN ~~LOC~~ HOSPITAL LAB): SARS Coronavirus 2: NEGATIVE

## 2019-02-27 LAB — CK
Total CK: 7172 U/L — ABNORMAL HIGH (ref 38–234)
Total CK: 6792 U/L — ABNORMAL HIGH (ref 38–234)

## 2019-02-27 LAB — HEPATIC FUNCTION PANEL
ALT: 94 U/L — ABNORMAL HIGH (ref 0–44)
AST: 210 U/L — ABNORMAL HIGH (ref 15–41)
Albumin: 3.6 g/dL (ref 3.5–5.0)
Alkaline Phosphatase: 107 U/L (ref 38–126)
Bilirubin, Direct: 0.3 mg/dL — ABNORMAL HIGH (ref 0.0–0.2)
Indirect Bilirubin: 0.9 mg/dL (ref 0.3–0.9)
Total Bilirubin: 1.2 mg/dL (ref 0.3–1.2)
Total Protein: 6.8 g/dL (ref 6.5–8.1)

## 2019-02-27 MED ORDER — FLUTICASONE PROPIONATE 50 MCG/ACT NA SUSP
1.0000 | Freq: Every day | NASAL | Status: DC
  Administered 2019-02-28 – 2019-03-04 (×5): 1 via NASAL
  Filled 2019-02-27: qty 16

## 2019-02-27 MED ORDER — SODIUM CHLORIDE 0.9% FLUSH
3.0000 mL | Freq: Two times a day (BID) | INTRAVENOUS | Status: DC
  Administered 2019-02-27 – 2019-03-03 (×7): 3 mL via INTRAVENOUS

## 2019-02-27 MED ORDER — SENNOSIDES-DOCUSATE SODIUM 8.6-50 MG PO TABS
1.0000 | ORAL_TABLET | Freq: Two times a day (BID) | ORAL | Status: DC
  Administered 2019-02-27 – 2019-03-02 (×6): 1 via ORAL
  Administered 2019-03-02: 2 via ORAL
  Administered 2019-03-03: 1 via ORAL
  Filled 2019-02-27: qty 2
  Filled 2019-02-27: qty 1
  Filled 2019-02-27: qty 2
  Filled 2019-02-27 (×6): qty 1

## 2019-02-27 MED ORDER — ATORVASTATIN CALCIUM 10 MG PO TABS
10.0000 mg | ORAL_TABLET | Freq: Every day | ORAL | Status: DC
  Administered 2019-02-28 – 2019-03-03 (×4): 10 mg via ORAL
  Filled 2019-02-27 (×4): qty 1

## 2019-02-27 MED ORDER — LORATADINE 10 MG PO TABS
10.0000 mg | ORAL_TABLET | Freq: Every day | ORAL | Status: DC
  Administered 2019-02-28 – 2019-03-04 (×5): 10 mg via ORAL
  Filled 2019-02-27 (×5): qty 1

## 2019-02-27 MED ORDER — ALBUTEROL SULFATE 0.63 MG/3ML IN NEBU: 3.0000 mL | INHALATION_SOLUTION | Freq: Three times a day (TID) | RESPIRATORY_TRACT | Status: DC | PRN

## 2019-02-27 MED ORDER — MELATONIN 3 MG PO TABS
6.0000 mg | ORAL_TABLET | Freq: Every evening | ORAL | Status: DC | PRN
  Administered 2019-03-02: 6 mg via ORAL
  Filled 2019-02-27 (×2): qty 2

## 2019-02-27 MED ORDER — MOMETASONE FURO-FORMOTEROL FUM 200-5 MCG/ACT IN AERO
2.0000 | INHALATION_SPRAY | Freq: Two times a day (BID) | RESPIRATORY_TRACT | Status: DC
  Administered 2019-02-27 – 2019-03-04 (×10): 2 via RESPIRATORY_TRACT
  Filled 2019-02-27: qty 8.8

## 2019-02-27 MED ORDER — IPRATROPIUM-ALBUTEROL 0.5-2.5 (3) MG/3ML IN SOLN: 3.0000 mL | RESPIRATORY_TRACT | Status: AC

## 2019-02-27 MED ORDER — ASPIRIN EC 81 MG PO TBEC
81.0000 mg | DELAYED_RELEASE_TABLET | Freq: Every day | ORAL | Status: DC
  Administered 2019-02-28 – 2019-03-04 (×5): 81 mg via ORAL
  Filled 2019-02-27 (×6): qty 1

## 2019-02-27 MED ORDER — SPIRONOLACTONE 25 MG PO TABS
25.0000 mg | ORAL_TABLET | Freq: Every day | ORAL | Status: DC
  Administered 2019-02-28 – 2019-03-04 (×5): 25 mg via ORAL
  Filled 2019-02-27 (×5): qty 1

## 2019-02-27 MED ORDER — MELATONIN 5 MG PO TABS
5.0000 mg | ORAL_TABLET | Freq: Every evening | ORAL | Status: DC | PRN
  Filled 2019-02-27: qty 1

## 2019-02-27 MED ORDER — ALBUTEROL SULFATE (2.5 MG/3ML) 0.083% IN NEBU: 2.5000 mg | INHALATION_SOLUTION | Freq: Four times a day (QID) | RESPIRATORY_TRACT | Status: DC | PRN

## 2019-02-27 MED ORDER — GUAIFENESIN 100 MG/5ML PO SOLN: 5.0000 mL | ORAL | Status: DC | PRN

## 2019-02-27 MED ORDER — SODIUM CHLORIDE 0.9 % IV BOLUS
1000.0000 mL | Freq: Once | INTRAVENOUS | Status: AC
  Administered 2019-02-27: 16:00:00 1000 mL via INTRAVENOUS

## 2019-02-27 MED ORDER — ENOXAPARIN SODIUM 40 MG/0.4ML ~~LOC~~ SOLN
40.0000 mg | SUBCUTANEOUS | Status: DC
  Administered 2019-02-28 – 2019-03-03 (×4): 40 mg via SUBCUTANEOUS
  Filled 2019-02-27 (×4): qty 0.4

## 2019-02-27 MED ORDER — SODIUM CHLORIDE 0.9 % IV SOLN
INTRAVENOUS | Status: DC
  Administered 2019-02-27 – 2019-03-01 (×5): via INTRAVENOUS

## 2019-02-27 MED ORDER — METOPROLOL TARTRATE 50 MG PO TABS
75.0000 mg | ORAL_TABLET | Freq: Two times a day (BID) | ORAL | Status: DC
  Administered 2019-02-27 – 2019-03-04 (×10): 75 mg via ORAL
  Filled 2019-02-27 (×9): qty 1

## 2019-02-27 NOTE — ED Provider Notes (Signed)
[facility] EMERGENCY DEPARTMENT Provider Note   CSN: [id number] Arrival date & time: 02/27/19  1156     History   Chief Complaint Chief Complaint  Patient presents with   Fall    HPI [name] is a 73 y.o. female with history of hypertension, CVA, CAD who presents following fall.  Patient was found down by her home health aide this morning.  She presumably was on the floor all night.  She is complaining of right hip pain and bilateral shoulder pain.  She denies any neck or back pain.  She is unsure if she hit her head or pass out.  She denies any chest pain, shortness of breath, abdominal pain, nausea, vomiting, numbness or tingling.     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      Home Medications    Prior to Admission medications   Medication Sig Start Date End Date Taking? Authorizing Provider  acetaminophen (TYLENOL) 500 MG tablet Take 500-1,000 mg by mouth 3 (three) times daily as needed for mild pain.    Yes [provider]  albuterol (ACCUNEB) 0.63 MG/3ML nebulizer solution Take 3 mLs by nebulization 3 (three) times daily as needed for wheezing or shortness of breath.   Yes [provider]    albuterol (PROVENTIL HFA;VENTOLIN HFA) 108 (90 BASE) MCG/ACT inhaler Inhale 2 puffs into the lungs every 6 (six) hours as needed for wheezing or shortness of breath. 03/20/15  Yes Gordy Savers, MD  amLODipine (NORVASC) 10 MG tablet Take 1 tablet (10 mg total) by mouth daily. 03/01/15  Yes Gordy Savers, MD  aspirin EC 81 MG tablet Take 81 mg by mouth daily.   Yes [provider]  atorvastatin (LIPITOR) 10 MG tablet Take 10 mg by mouth daily.   Yes [provider]  budesonide-formoterol (SYMBICORT) 160-4.5 MCG/ACT inhaler Inhale 2 puffs into the lungs 2 (two) times daily.    Yes [provider]  citalopram (CELEXA) 20 MG tablet Take 20 mg by mouth daily.   Yes [provider]  diphenhydrAMINE (BENADRYL) 25 MG tablet Take 1 tablet (25 mg total) by mouth every 6 (six) hours. Patient taking differently: Take 25 mg by mouth 3 (three) times daily as needed (lip swelling).  02/08/18  Yes Nanavati, Ankit, MD  EPINEPHrine 0.3 mg/0.3 mL IJ SOAJ injection Inject 0.3 mg into the muscle See admin instructions. Inject once as needed for allergic reaction. May repeat dose x1 after 5-15 minutes if needed.   Yes [provider]  fexofenadine (ALLEGRA) 60 MG tablet Take 60 mg by mouth 2 (two) times daily.   Yes [provider]  fluticasone (FLONASE) 50 MCG/ACT nasal spray Place 1 spray  into both nostrils daily.   Yes [provider]  furosemide (LASIX) 40 MG tablet Take 40 mg by mouth 2 (two) times daily.   Yes [provider]  guaiFENesin (ROBITUSSIN) 100 MG/5ML SOLN Take 5 mLs by mouth every 4 (four) hours as needed for cough or to loosen phlegm.   Yes [provider]  Melatonin 5 MG TABS Take 5 mg by mouth at bedtime as needed (insomnia).    Yes [provider]  Menthol, Topical Analgesic, (BIOFREEZE EX) Apply 1 application topically 3 (three) times daily as needed (joint pain).   Yes [provider]   metoprolol tartrate (LOPRESSOR) 50 MG tablet Take 75 mg by mouth See admin instructions. Take 1 1/2 tablets ( ) twice daily   Yes [provider]  sennosides-docusate sodium (SENOKOT-S) 8.6-50 MG tablet Take 1-2 tablets by mouth 2 (two) times daily.   Yes [provider]  spironolactone (ALDACTONE) 25 MG tablet Take 25 mg by mouth daily.    Yes [provider]  losartan-hydrochlorothiazide (HYZAAR) 100-25 MG per tablet Take 1 tablet by mouth daily. 11/03/11 11/14/11  Eulis Foster, FNP    Family History Family History  Problem Relation Age of Onset   Asthma Mother    Asthma Sister     Social History Social History   Tobacco Use   Smoking status: Never Smoker   Smokeless tobacco: Never Used  Substance Use Topics   Alcohol use: No   Drug use: No     Allergies   Lisinopril, Zolpidem tartrate, and Eggs or egg-derived products   Review of Systems Review of Systems  Constitutional: Negative for chills and fever.  HENT: Negative for facial swelling and sore throat.   Respiratory: Negative for shortness of breath.   Cardiovascular: Positive for leg swelling. Negative for chest pain.  Gastrointestinal: Negative for abdominal pain, nausea and vomiting.  Genitourinary: Negative for dysuria.  Musculoskeletal: Positive for arthralgias. Negative for back pain.  Skin: Negative for rash and wound.  Neurological: Negative for headaches.  Psychiatric/Behavioral: The patient is not nervous/anxious.      Physical Exam Updated Vital Signs BP (!) 123/49    Pulse (!) 58    Temp 97.8 F (36.6 C) (Oral)    Resp 15    SpO2 96%   Physical Exam Vitals signs and nursing note reviewed.  Constitutional:      General: She is not in acute distress.    Appearance: She is well-developed. She is not diaphoretic.  HENT:     Head: Normocephalic and atraumatic.     Mouth/Throat:     Pharynx: No oropharyngeal exudate.  Eyes:     General: No scleral icterus.        Right eye: No discharge.        Left eye: No discharge.     Conjunctiva/sclera: Conjunctivae normal.     Pupils: Pupils are equal, round, and reactive to light.  Neck:     Musculoskeletal: Normal range of motion and neck supple.     Thyroid: No thyromegaly.  Cardiovascular:     Rate and Rhythm: Normal rate and regular rhythm.     Heart sounds: Normal heart sounds. No murmur. No friction rub. No gallop.   Pulmonary:     Effort: Pulmonary effort is normal. No respiratory distress.     Breath sounds: Normal breath sounds. No stridor. No wheezing or rales.  Abdominal:     General: Bowel sounds are normal. There is no distension.  Palpations: Abdomen is soft.     Tenderness: There is no abdominal tenderness. There is no guarding or rebound.  Musculoskeletal:     Right lower leg: Edema present.     Left lower leg: Edema present.     Comments: Bilateral 2+ pitting edema with some erythema; this appears to be baseline for the patient  Lymphadenopathy:     Cervical: No cervical adenopathy.  Skin:    General: Skin is warm and dry.     Coloration: Skin is not pale.     Findings: No rash.  Neurological:     Mental Status: She is alert.     Coordination: Coordination normal.      ED Treatments / Results  Labs (all labs ordered are listed, but only abnormal results are displayed) Labs Reviewed  BASIC METABOLIC PANEL - Abnormal; Notable for the following components:      Result Value   Glucose, Bld 119 (*)    GFR calc non Af Amer 60 (*)    All other components within normal limits  CBC WITH DIFFERENTIAL/PLATELET - Abnormal; Notable for the following components:   Hemoglobin 11.7 (*)    All other components within normal limits  CK - Abnormal; Notable for the following components:   Total CK 7,172 (*)    All other components within normal limits  HEPATIC FUNCTION PANEL - Abnormal; Notable for the following components:   AST 210 (*)    ALT 94 (*)    Bilirubin, Direct 0.3  (*)    All other components within normal limits  SARS CORONAVIRUS 2 (HOSPITAL ORDER, PERFORMED IN [facility])  URINALYSIS, ROUTINE W REFLEX MICROSCOPIC  HIV ANTIBODY (ROUTINE TESTING W REFLEX)  CBC  CK  CK  CK  BASIC METABOLIC PANEL  BASIC METABOLIC PANEL  BASIC METABOLIC PANEL    EKG EKG Interpretation  Date/Time:  Sunday February 27 2019 12:58:49 EDT Ventricular Rate:  55 PR Interval:    QRS Duration: 213 QT Interval:  473 QTC Calculation: 453 R Axis:   97 Text Interpretation:  Sinus rhythm Nonspecific intraventricular conduction delay Artifact in lead(s) I II III aVR aVL aVF V1 V3 V4 When copmpared to prior, more artifact.  No STEMI Confirmed by Theda Belfastegeler, Chris ([id number]) on 02/27/2019 1:10:24 PM   Radiology Dg Shoulder Right  Result Date: 02/27/2019 CLINICAL DATA:  73 year old female found down, presumed fall EXAM: RIGHT SHOULDER - 2+ VIEW COMPARISON:  Concurrently obtained radiographs of the left shoulder. FINDINGS: There is no evidence of fracture or dislocation. There is no evidence of arthropathy or other focal bone abnormality. Soft tissues are unremarkable. IMPRESSION: Negative. Electronically Signed   By: Malachy MoanHeath  McCullough M.D.   On: 02/27/2019 14:15   Dg Tibia/fibula Right  Result Date: 02/27/2019 CLINICAL DATA:  73 year old female found down with right-sided arm and hip pain EXAM: RIGHT TIBIA AND FIBULA - 2 VIEW COMPARISON:  None. FINDINGS: No acute fracture or malalignment. Degenerative osteoarthritis of the knee joint involving all 3 compartments. Scattered atherosclerotic vascular calcifications. IMPRESSION: No acute fracture or malalignment. Atherosclerotic vascular calcifications. Electronically Signed   By: Malachy MoanHeath  McCullough M.D.   On: 02/27/2019 14:09   Ct Head Wo Contrast  Result Date: 02/27/2019 CLINICAL DATA:  Fall.  Found down on kitchen floor.  Confusion. EXAM: CT HEAD WITHOUT CONTRAST CT CERVICAL SPINE WITHOUT CONTRAST TECHNIQUE: Multidetector CT  imaging of the head and cervical spine was performed following the standard protocol without intravenous contrast. Multiplanar CT image reconstructions of  the cervical spine were also generated. COMPARISON:  02/17/2014 head CT.  12/25/2004 cervical spine CT. FINDINGS: CT HEAD FINDINGS Brain: No evidence of parenchymal hemorrhage or extra-axial fluid collection. No mass lesion, mass effect, or midline shift. No CT evidence of acute infarction. Nonspecific prominent subcortical and periventricular white matter hypodensity, most in keeping with chronic small vessel ischemic change. Generalized cerebral volume loss. Cerebral ventricle sizes are concordant with the degree of cerebral volume loss. Vascular: No acute abnormality. Skull: No evidence of calvarial fracture. Sinuses/Orbits: The visualized paranasal sinuses are essentially clear. Other:  The mastoid air cells are unopacified. CT CERVICAL SPINE FINDINGS Alignment: Straightening of the cervical spine. No facet subluxation. Dens is well positioned between the lateral masses of C1. Skull base and vertebrae: No acute fracture. No primary bone lesion or focal pathologic process. Soft tissues and spinal canal: No prevertebral edema. No visible canal hematoma. Disc levels: Marked multilevel degenerative disc disease throughout the cervical spine. Mild-to-moderate bilateral facet arthropathy. Mild degenerate foraminal stenosis bilaterally at C4-5. Upper chest: No acute abnormality. Other: Visualized mastoid air cells appear clear. Hypodense 2.1 cm right thyroid nodule. No pathologically enlarged cervical nodes. IMPRESSION: 1. No evidence of acute intracranial abnormality. No evidence of calvarial fracture. 2. Generalized cerebral volume loss and prominent chronic small vessel ischemic changes in the cerebral white matter. 3. No cervical spine fracture or subluxation. 4. Moderate to marked degenerative changes throughout the cervical spine as detailed. 5. Hypodense 2.1  cm right thyroid nodule, for which thyroid ultrasound correlation is indicated if not previously performed. This follows ACR consensus guidelines: Managing Incidental Thyroid Nodules Detected on Imaging: White Paper of the ACR Incidental Thyroid Findings Committee. J Am Coll Radiol 2015; 12:143-150. Electronically Signed   By: Delbert PhenixJason A Poff M.D.   On: 02/27/2019 14:24   Ct Cervical Spine Wo Contrast  Result Date: 02/27/2019 CLINICAL DATA:  Fall.  Found down on kitchen floor.  Confusion. EXAM: CT HEAD WITHOUT CONTRAST CT CERVICAL SPINE WITHOUT CONTRAST TECHNIQUE: Multidetector CT imaging of the head and cervical spine was performed following the standard protocol without intravenous contrast. Multiplanar CT image reconstructions of the cervical spine were also generated. COMPARISON:  02/17/2014 head CT.  12/25/2004 cervical spine CT. FINDINGS: CT HEAD FINDINGS Brain: No evidence of parenchymal hemorrhage or extra-axial fluid collection. No mass lesion, mass effect, or midline shift. No CT evidence of acute infarction. Nonspecific prominent subcortical and periventricular white matter hypodensity, most in keeping with chronic small vessel ischemic change. Generalized cerebral volume loss. Cerebral ventricle sizes are concordant with the degree of cerebral volume loss. Vascular: No acute abnormality. Skull: No evidence of calvarial fracture. Sinuses/Orbits: The visualized paranasal sinuses are essentially clear. Other:  The mastoid air cells are unopacified. CT CERVICAL SPINE FINDINGS Alignment: Straightening of the cervical spine. No facet subluxation. Dens is well positioned between the lateral masses of C1. Skull base and vertebrae: No acute fracture. No primary bone lesion or focal pathologic process. Soft tissues and spinal canal: No prevertebral edema. No visible canal hematoma. Disc levels: Marked multilevel degenerative disc disease throughout the cervical spine. Mild-to-moderate bilateral facet arthropathy.  Mild degenerate foraminal stenosis bilaterally at C4-5. Upper chest: No acute abnormality. Other: Visualized mastoid air cells appear clear. Hypodense 2.1 cm right thyroid nodule. No pathologically enlarged cervical nodes. IMPRESSION: 1. No evidence of acute intracranial abnormality. No evidence of calvarial fracture. 2. Generalized cerebral volume loss and prominent chronic small vessel ischemic changes in the cerebral white matter. 3. No cervical spine fracture or subluxation.  4. Moderate to marked degenerative changes throughout the cervical spine as detailed. 5. Hypodense 2.1 cm right thyroid nodule, for which thyroid ultrasound correlation is indicated if not previously performed. This follows ACR consensus guidelines: Managing Incidental Thyroid Nodules Detected on Imaging: White Paper of the ACR Incidental Thyroid Findings Committee. J Am Coll Radiol 2015; 12:143-150. Electronically Signed   By: Delbert PhenixJason A Poff M.D.   On: 02/27/2019 14:24   Dg Chest Portable 1 View  Result Date: 02/27/2019 CLINICAL DATA:  Fall.  Bilateral hip pain. EXAM: PORTABLE CHEST 1 VIEW COMPARISON:  09/20/2018 FINDINGS: Oblique AP portable radiograph with the chin overlying the apices. Midline trachea. Mild cardiomegaly with a tortuous thoracic aorta. No pleural effusion or pneumothorax. No congestive failure. Clear lungs. IMPRESSION: No acute cardiopulmonary disease. Mild cardiomegaly, without acute disease. Mild limitations as detailed above. Electronically Signed   By: Jeronimo GreavesKyle  Talbot M.D.   On: 02/27/2019 13:19   Dg Shoulder Left  Result Date: 02/27/2019 CLINICAL DATA:  73 year old female found down, presumed fall EXAM: LEFT SHOULDER - 2+ VIEW COMPARISON:  Concurrently obtained radiographs of the right shoulder FINDINGS: There is no evidence of fracture or dislocation. There is no evidence of arthropathy or other focal bone abnormality. Soft tissues are unremarkable. IMPRESSION: Negative. Electronically Signed   By: Malachy MoanHeath   McCullough M.D.   On: 02/27/2019 14:14   Dg Knee Complete 4 Views Right  Result Date: 02/27/2019 CLINICAL DATA:  73 year old female found down, presumed fall EXAM: RIGHT KNEE - COMPLETE 4+ VIEW COMPARISON:  None. FINDINGS: No evidence of acute fracture, malalignment or knee joint effusion. Advanced tricompartmental degenerative osteoarthritis with joint space narrowing, subchondral sclerosis and osteophyte formation. Scattered atherosclerotic vascular calcifications visualized in the superficial femoral, popliteal and runoff arteries. No focal soft tissue abnormality. IMPRESSION: 1. No acute fracture or malalignment. 2. Tricompartmental degenerative osteoarthritis. 3. Atherosclerotic vascular calcifications. Electronically Signed   By: Malachy MoanHeath  McCullough M.D.   On: 02/27/2019 14:10   Dg Hips Bilat With Pelvis 3-4 Views  Result Date: 02/27/2019 CLINICAL DATA:  73 year old female found down with right hip pain, presumed fall EXAM: DG HIP (WITH OR WITHOUT PELVIS) 3-4V BILAT COMPARISON:  Prior radiographs of the pelvis 02/17/2019 FINDINGS: No evidence of acute fracture or dislocation. Mild bilateral hip joint osteoarthritis. Extensive atherosclerotic vascular calcifications. The bones appear osteopenic. Lower lumbar degenerative disc disease and facet arthropathy. IMPRESSION: 1. No acute fracture or malalignment. 2. Bilateral hip joint osteoarthritis. 3. Lower lumbar degenerative disc disease and L5-S1 facet arthropathy worse on the right. 4. Extensive atherosclerotic vascular calcifications. Electronically Signed   By: Malachy MoanHeath  McCullough M.D.   On: 02/27/2019 14:12    Procedures Procedures (including critical care time)  Medications Ordered in ED Medications  enoxaparin (LOVENOX) injection 40 mg (has no administration in time range)  sodium chloride flush (NS) 0.9 % injection 3 mL (has no administration in time range)  0.9 %  sodium chloride infusion (has no administration in time range)   ipratropium-albuterol (DUONEB) 0.5-2.5 (3) MG/3ML nebulizer solution 3 mL (has no administration in time range)  sodium chloride 0.9 % bolus 1,000 mL (0 mLs Intravenous Stopped 02/27/19 1650)     Initial Impression / Assessment and Plan / ED Course  I have reviewed the triage vital signs and the nursing notes.  Pertinent labs & imaging results that were available during my care of the patient were reviewed by me and considered in my medical decision making (see chart for details).        Patient  presenting following fall.  Patient has no acute fractures or intracranial bleeding, however her CK is over 7000.  Otherwise labs are stable for the patient.  CT head shows chronic right ischemic changes and degenerative changes in the C-spine.  There is also a hypodense 2.1 cm right thyroid nodule.  Will defer further evaluation by medicine or outpatient.  IV fluids initiated.  Discussed patient case with internal medicine teaching service who accepts patient for admission and further hydration for rhabdomyolysis.  I spoke with patient's daughter, Janace Hoard and updated her with patient's plan of care.  I also discussed patient case with Mariann Laster, case Freight forwarder, who is in communication with case manager at [facility].  Patient may benefit from placement at a SNF at discharge because of her frequent falls and seemingly decompensation.  Patient also evaluated by my attending, Dr. Sherry Ruffing, who guided the patient's management and agrees with plan.  Final Clinical Impressions(s) / ED Diagnoses   Final diagnoses:  Fall, initial encounter  Traumatic rhabdomyolysis, initial encounter [facility])    ED Discharge Orders    None       Frederica Kuster, PA-C 02/27/19 1738    Tegeler, Gwenyth Allegra, MD 03/01/19 1505

## 2019-02-27 NOTE — ED Notes (Signed)
Pt returned to room from CT and Xray.

## 2019-02-27 NOTE — ED Notes (Signed)
ED TO INPATIENT HANDOFF REPORT  ED Nurse Name and Phone #: Tammy Sours RN [phone]  S Name/Age/Gender [name] 73 y.o. female Room/Bed: 034C/034C  Code Status   Code Status: Not on file  Home/SNF/Other Home Patient oriented to: self, place, time and situation Is this baseline? Yes   Triage Complete: Triage complete  Chief Complaint FALL  Triage Note [facility] reports patient coming from home and she was found in the kitchen on the floor by her morning home health aid. We do not know how long she was on the floor. She was laying on her right side in a pool of urine and feces. She has been confused and is normally oriented. Her current complaint is right -sided arm and hip pain.    Allergies Allergies  Allergen Reactions  . Lisinopril Swelling  . Zolpidem Tartrate     Other reaction(s): Agitation  . Eggs Or Egg-Derived Products     Upset stomach     Level of Care/Admitting Diagnosis ED Disposition    ED Disposition Condition Comment   Admit  The patient appears reasonably stabilized for admission considering the current resources, flow, and capabilities available in the ED at this time, and I doubt any other [facility] requiring further screening and/or treatment in the ED prior to admission is  present.       B Medical/Surgery History Past Medical History:  Diagnosis Date  . 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 ASTHMA 04/16/2007  . 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 CORONARY ARTERY DISEASE 08/29/2008  . Environmental allergies   . Hyperlipidemia   . HYPERTENSION 04/16/2007  . Osteoarthritis   . Stroke (HCC)   . URI 10/25/2009   Past Surgical History:  Procedure Laterality Date  . CARDIAC CATHETERIZATION       A IV Location/Drains/Wounds Patient Lines/Drains/Airways Status   Active Line/Drains/Airways    Name:   Placement date:   Placement time:   Site:   Days:   Peripheral IV 02/27/19 Right;Posterior Forearm   02/27/19    1541    Forearm   less than 1           Intake/Output Last 24 hours No intake or output data in the 24 hours ending 02/27/19 1635  Labs/Imaging Results for orders placed or performed during the hospital encounter of 02/27/19 (from the past 48 hour(s))  SARS Coronavirus 2 (CEPHEID - Performed in [facility] hospital lab), Hosp Order     Status: None   Collection Time: 02/27/19 12:40 PM   Specimen: Nasopharyngeal Swab  Result Value Ref Range   SARS Coronavirus 2 NEGATIVE NEGATIVE    Comment: (NOTE) If result is NEGATIVE SARS-CoV-2 target nucleic acids are NOT DETECTED. The SARS-CoV-2 RNA is generally detectable in upper and lower  respiratory specimens during the acute phase of infection. The lowest  concentration of SARS-CoV-2 viral copies this assay can detect is 250  copies / mL. A negative result does not preclude SARS-CoV-2 infection  and should not be used as the sole basis for treatment or other  patient management decisions.  A negative result may occur with  improper specimen collection / handling, submission of specimen other  than nasopharyngeal swab, presence of viral mutation(s) within the  areas targeted by this assay, and inadequate number of viral copies  (<250 copies / mL). A negative result must be combined with clinical  observations, patient history, and epidemiological information. If result is POSITIVE SARS-CoV-2 target nucleic acids are DETECTED. The SARS-CoV-2 RNA is generally  detectable in upper and lower  respiratory specimens dur ing the acute phase of infection.  Positive  results are indicative of active infection with SARS-CoV-2.  Clinical  correlation with patient history and other diagnostic information is  necessary to determine patient infection status.  Positive results do  not rule out bacterial infection or co-infection with other viruses. If result is PRESUMPTIVE POSTIVE SARS-CoV-2 nucleic acids MAY BE PRESENT.   A presumptive positive result was obtained on the submitted  specimen  and confirmed on repeat testing.  While 2019 novel coronavirus  (SARS-CoV-2) nucleic acids may be present in the submitted sample  additional confirmatory testing may be necessary for epidemiological  and / or clinical management purposes  to differentiate between  SARS-CoV-2 and other Sarbecovirus currently known to infect humans.  If clinically indicated additional testing with an alternate test  methodology [phone]) is advised. The SARS-CoV-2 RNA is generally  detectable in upper and lower respiratory sp ecimens during the acute  phase of infection. The expected result is Negative. Fact Sheet for Patients:  StrictlyIdeas.no Fact Sheet for Healthcare Providers: BankingDealers.co.za This test is not yet approved or cleared by the [place] FDA and has been authorized for detection and/or diagnosis of SARS-CoV-2 by FDA under an Emergency Use Authorization (EUA).  This EUA will remain in effect (meaning this test can be used) for the duration of the COVID-19 declaration under Section 564(b)(1) of the Act, 21 U.S.C. section 360bbb-3(b)(1), unless the authorization is terminated or revoked sooner. Performed at [facility], [place] [address] [place], [place] [address]   Basic metabolic panel     Status: Abnormal   Collection Time: 02/27/19 12:55 PM  Result Value Ref Range   Sodium 140 135 - 145 mmol/L   Potassium 4.3 3.5 - 5.1 mmol/L   Chloride 102 98 - 111 mmol/L   CO2 28 22 - 32 mmol/L   Glucose, Bld 119 (H) 70 - 99 mg/dL   BUN 12 8 - 23 mg/dL   Creatinine, Ser 0.96 0.44 - 1.00 mg/dL   Calcium 9.3 8.9 - 10.3 mg/dL   GFR calc non Af Amer 60 (L) >60 mL/min   GFR calc Af Amer >60 >60 mL/min   Anion gap 10 5 - 15    Comment: Performed at [facility], [place] [address], [place], [place] [address]  CBC with Differential     Status: Abnormal   Collection Time: 02/27/19 12:55 PM  Result Value Ref Range   WBC 6.1  4.0 - 10.5 K/uL   RBC 4.24 3.87 - 5.11 MIL/uL   Hemoglobin 11.7 (L) 12.0 - 15.0 g/dL   HCT 38.7 36.0 - 46.0 %   MCV 91.3 80.0 - 100.0 fL   MCH 27.6 26.0 - 34.0 pg   MCHC 30.2 30.0 - 36.0 g/dL   RDW 14.8 11.5 - 15.5 %   Platelets 228 150 - 400 K/uL   nRBC 0.0 0.0 - 0.2 %   Neutrophils Relative % 74 %   Neutro Abs 4.6 1.7 - 7.7 K/uL   Lymphocytes Relative 16 %   Lymphs Abs 1.0 0.7 - 4.0 K/uL   Monocytes Relative 8 %   Monocytes Absolute 0.5 0.1 - 1.0 K/uL   Eosinophils Relative 1 %   Eosinophils Absolute 0.1 0.0 - 0.5 K/uL   Basophils Relative 1 %   Basophils Absolute 0.0 0.0 - 0.1 K/uL   Immature Granulocytes 0 %   Abs Immature Granulocytes 0.01 0.00 -  0.07 K/uL    Comment: Performed at [facility], [address] [place], [place] [address]  [place]     Status: Abnormal   Collection Time: 02/27/19 12:55 PM  Result Value Ref Range   Total CK 7,172 (H) 38 - 234 U/L    Comment: RESULTS CONFIRMED BY MANUAL DILUTION Performed at [facility], [address] [place], [place] [address]   Hepatic function panel     Status: Abnormal   Collection Time: 02/27/19 12:55 PM  Result Value Ref Range   Total Protein 6.8 6.5 - 8.1 g/dL   Albumin 3.6 3.5 - 5.0 g/dL   AST 098210 (H) 15 - 41 U/L   ALT 94 (H) 0 - 44 U/L   Alkaline Phosphatase 107 38 - 126 U/L   Total Bilirubin 1.2 0.3 - 1.2 mg/dL   Bilirubin, Direct 0.3 (H) 0.0 - 0.2 mg/dL   Indirect Bilirubin 0.9 0.3 - 0.9 mg/dL    Comment: Performed at [facility], [address] [place], [place] [address]   Dg Shoulder Right  Result Date: 02/27/2019 CLINICAL DATA:  73 year old female found down, presumed fall EXAM: RIGHT SHOULDER - 2+ VIEW COMPARISON:  Concurrently obtained radiographs of the left shoulder. FINDINGS: There is no evidence of fracture or dislocation. There is no evidence of arthropathy or other focal bone abnormality. Soft tissues are unremarkable. IMPRESSION: Negative. Electronically Signed   By: Malachy MoanHeath   McCullough M.D.   On: 02/27/2019 14:15   Dg Tibia/fibula Right  Result Date: 02/27/2019 CLINICAL DATA:  73 year old female found down with right-sided arm and hip pain EXAM: RIGHT TIBIA AND FIBULA - 2 VIEW COMPARISON:  None. FINDINGS: No acute fracture or malalignment. Degenerative osteoarthritis of the knee joint involving all 3 compartments. Scattered atherosclerotic vascular calcifications. IMPRESSION: No acute fracture or malalignment. Atherosclerotic vascular calcifications. Electronically Signed   By: Malachy MoanHeath  McCullough M.D.   On: 02/27/2019 14:09   Ct Head Wo Contrast  Result Date: 02/27/2019 CLINICAL DATA:  Fall.  Found down on kitchen floor.  Confusion. EXAM: CT HEAD WITHOUT CONTRAST CT CERVICAL SPINE WITHOUT CONTRAST TECHNIQUE: Multidetector CT imaging of the head and cervical spine was performed following the standard protocol without intravenous contrast. Multiplanar CT image reconstructions of the cervical spine were also generated. COMPARISON:  02/17/2014 head CT.  12/25/2004 cervical spine CT. FINDINGS: CT HEAD FINDINGS Brain: No evidence of parenchymal hemorrhage or extra-axial fluid collection. No mass lesion, mass effect, or midline shift. No CT evidence of acute infarction. Nonspecific prominent subcortical and periventricular white matter hypodensity, most in keeping with chronic small vessel ischemic change. Generalized cerebral volume loss. Cerebral ventricle sizes are concordant with the degree of cerebral volume loss. Vascular: No acute abnormality. Skull: No evidence of calvarial fracture. Sinuses/Orbits: The visualized paranasal sinuses are essentially clear. Other:  The mastoid air cells are unopacified. CT CERVICAL SPINE FINDINGS Alignment: Straightening of the cervical spine. No facet subluxation. Dens is well positioned between the lateral masses of C1. Skull base and vertebrae: No acute fracture. No primary bone lesion or focal pathologic process. Soft tissues and spinal canal: No  prevertebral edema. No visible canal hematoma. Disc levels: Marked multilevel degenerative disc disease throughout the cervical spine. Mild-to-moderate bilateral facet arthropathy. Mild degenerate foraminal stenosis bilaterally at C4-5. Upper chest: No acute abnormality. Other: Visualized mastoid air cells appear clear. Hypodense 2.1 cm right thyroid nodule. No pathologically enlarged cervical nodes. IMPRESSION: 1. No evidence of acute intracranial abnormality. No evidence of calvarial  fracture. 2. Generalized cerebral volume loss and prominent chronic small vessel ischemic changes in the cerebral white matter. 3. No cervical spine fracture or subluxation. 4. Moderate to marked degenerative changes throughout the cervical spine as detailed. 5. Hypodense 2.1 cm right thyroid nodule, for which thyroid ultrasound correlation is indicated if not previously performed. This follows ACR consensus guidelines: Managing Incidental Thyroid Nodules Detected on Imaging: White Paper of the ACR Incidental Thyroid Findings Committee. J Am Coll Radiol 2015; 12:143-150. Electronically Signed   By: Delbert PhenixJason A Poff M.D.   On: 02/27/2019 14:24   Ct Cervical Spine Wo Contrast  Result Date: 02/27/2019 CLINICAL DATA:  Fall.  Found down on kitchen floor.  Confusion. EXAM: CT HEAD WITHOUT CONTRAST CT CERVICAL SPINE WITHOUT CONTRAST TECHNIQUE: Multidetector CT imaging of the head and cervical spine was performed following the standard protocol without intravenous contrast. Multiplanar CT image reconstructions of the cervical spine were also generated. COMPARISON:  02/17/2014 head CT.  12/25/2004 cervical spine CT. FINDINGS: CT HEAD FINDINGS Brain: No evidence of parenchymal hemorrhage or extra-axial fluid collection. No mass lesion, mass effect, or midline shift. No CT evidence of acute infarction. Nonspecific prominent subcortical and periventricular white matter hypodensity, most in keeping with chronic small vessel ischemic change.  Generalized cerebral volume loss. Cerebral ventricle sizes are concordant with the degree of cerebral volume loss. Vascular: No acute abnormality. Skull: No evidence of calvarial fracture. Sinuses/Orbits: The visualized paranasal sinuses are essentially clear. Other:  The mastoid air cells are unopacified. CT CERVICAL SPINE FINDINGS Alignment: Straightening of the cervical spine. No facet subluxation. Dens is well positioned between the lateral masses of C1. Skull base and vertebrae: No acute fracture. No primary bone lesion or focal pathologic process. Soft tissues and spinal canal: No prevertebral edema. No visible canal hematoma. Disc levels: Marked multilevel degenerative disc disease throughout the cervical spine. Mild-to-moderate bilateral facet arthropathy. Mild degenerate foraminal stenosis bilaterally at C4-5. Upper chest: No acute abnormality. Other: Visualized mastoid air cells appear clear. Hypodense 2.1 cm right thyroid nodule. No pathologically enlarged cervical nodes. IMPRESSION: 1. No evidence of acute intracranial abnormality. No evidence of calvarial fracture. 2. Generalized cerebral volume loss and prominent chronic small vessel ischemic changes in the cerebral white matter. 3. No cervical spine fracture or subluxation. 4. Moderate to marked degenerative changes throughout the cervical spine as detailed. 5. Hypodense 2.1 cm right thyroid nodule, for which thyroid ultrasound correlation is indicated if not previously performed. This follows ACR consensus guidelines: Managing Incidental Thyroid Nodules Detected on Imaging: White Paper of the ACR Incidental Thyroid Findings Committee. J Am Coll Radiol 2015; 12:143-150. Electronically Signed   By: Delbert PhenixJason A Poff M.D.   On: 02/27/2019 14:24   Dg Chest Portable 1 View  Result Date: 02/27/2019 CLINICAL DATA:  Fall.  Bilateral hip pain. EXAM: PORTABLE CHEST 1 VIEW COMPARISON:  09/20/2018 FINDINGS: Oblique AP portable radiograph with the chin overlying  the apices. Midline trachea. Mild cardiomegaly with a tortuous thoracic aorta. No pleural effusion or pneumothorax. No congestive failure. Clear lungs. IMPRESSION: No acute cardiopulmonary disease. Mild cardiomegaly, without acute disease. Mild limitations as detailed above. Electronically Signed   By: Jeronimo GreavesKyle  Talbot M.D.   On: 02/27/2019 13:19   Dg Shoulder Left  Result Date: 02/27/2019 CLINICAL DATA:  73 year old female found down, presumed fall EXAM: LEFT SHOULDER - 2+ VIEW COMPARISON:  Concurrently obtained radiographs of the right shoulder FINDINGS: There is no evidence of fracture or dislocation. There is no evidence of arthropathy or other focal bone abnormality.  Soft tissues are unremarkable. IMPRESSION: Negative. Electronically Signed   By: Malachy MoanHeath  McCullough M.D.   On: 02/27/2019 14:14   Dg Knee Complete 4 Views Right  Result Date: 02/27/2019 CLINICAL DATA:  73 year old female found down, presumed fall EXAM: RIGHT KNEE - COMPLETE 4+ VIEW COMPARISON:  None. FINDINGS: No evidence of acute fracture, malalignment or knee joint effusion. Advanced tricompartmental degenerative osteoarthritis with joint space narrowing, subchondral sclerosis and osteophyte formation. Scattered atherosclerotic vascular calcifications visualized in the superficial femoral, popliteal and runoff arteries. No focal soft tissue abnormality. IMPRESSION: 1. No acute fracture or malalignment. 2. Tricompartmental degenerative osteoarthritis. 3. Atherosclerotic vascular calcifications. Electronically Signed   By: Malachy MoanHeath  McCullough M.D.   On: 02/27/2019 14:10   Dg Hips Bilat With Pelvis 3-4 Views  Result Date: 02/27/2019 CLINICAL DATA:  73 year old female found down with right hip pain, presumed fall EXAM: DG HIP (WITH OR WITHOUT PELVIS) 3-4V BILAT COMPARISON:  Prior radiographs of the pelvis 02/17/2019 FINDINGS: No evidence of acute fracture or dislocation. Mild bilateral hip joint osteoarthritis. Extensive atherosclerotic vascular  calcifications. The bones appear osteopenic. Lower lumbar degenerative disc disease and facet arthropathy. IMPRESSION: 1. No acute fracture or malalignment. 2. Bilateral hip joint osteoarthritis. 3. Lower lumbar degenerative disc disease and L5-S1 facet arthropathy worse on the right. 4. Extensive atherosclerotic vascular calcifications. Electronically Signed   By: Malachy MoanHeath  McCullough M.D.   On: 02/27/2019 14:12    Pending Labs Unresulted Labs (From admission, onward)    Start     Ordered   02/27/19 1457  Urinalysis, Routine w reflex microscopic  ONCE - STAT,   STAT     02/27/19 1456          Vitals/Pain Today's Vitals   02/27/19 1443 02/27/19 1445 02/27/19 1500 02/27/19 1508  BP:  132/72 (!) 126/55   Pulse: (!) 58     Resp: 15 16 14    Temp:      TempSrc:      SpO2: 94%  96%   PainSc:    0-No pain    Isolation Precautions No active isolations  Medications Medications  sodium chloride 0.9 % bolus 1,000 mL (1,000 mLs Intravenous New Bag/Given 02/27/19 1543)    Mobility walks High fall risk   Focused Assessments Cardiac Assessment Handoff:  Cardiac Rhythm: Sinus bradycardia Lab Results  Component Value Date   CKTOTAL 7,172 (H) 02/27/2019   CKMB 48.1 (H) 08/10/2008   TROPONINI (HH) 08/10/2008    4.95        POSSIBLE MYOCARDIAL ISCHEMIA. SERIAL TESTING RECOMMENDED. CRITICAL RESULT CALLED TO, READ BACK BY AND VERIFIED WITH: M HUDGSON,RN,1639,08/10/08,D BRADLEY   No results found for: DDIMER Does the Patient currently have chest pain? No     R Recommendations: See Admitting Provider Note  Report given to:   Additional Notes:

## 2019-02-27 NOTE — Care Management (Signed)
ED CM updated [facility] that concerning patient's admission, spoke with Sharlyne Cai [phone]

## 2019-02-27 NOTE — ED Notes (Signed)
Doctor at bedside.

## 2019-02-27 NOTE — H&P (Signed)
Date: 02/27/2019               Patient Name:  [name] MRN: [id number]  DOB: Jun 01, 1946 Age / Sex: 73 y.o., female   PCP: Miguel AschoffWilliams, Julie Anne, MD         Medical Service: Internal Medicine Teaching Service         Attending Physician: Dr. Rush Landmarkegeler, Canary Brimhristopher J, *    First Contact: Dr. Barbaraann FasterSteen Pager: [phone]  Second Contact: Dr. Alinda MoneyMelvin Pager: [phone]       After Hours (After 5p/  First Contact Pager: [phone]  weekends / holidays): Second Contact Pager: [phone]   Chief Complaint: Fall   History of Present Illness: [name] is a 73 y.o. female with history of asthma, HTN, CVA, CKD stage 3, depression, albinism with macular degeneration and CAD who presents after a fall. She recalls talking to her daughter on the phone and then after she went to the kitchen. She was walking using a wheelchair and fell in her kitchen. This morning she was found by her home health aide. She says she is has a history of falls because she does not ambulate well. On my exam she is no longer in pain. De  She complained of R.Hip pain and shoulder pain in ED. Imaging in ED ruled out acute fractures and intracranial bleeding. Patient was found to have CK 7000.   Meds:  No current facility-administered medications on file prior to encounter.    Current Outpatient Medications on File Prior to Encounter  Medication Sig  . acetaminophen (TYLENOL) 500 MG tablet Take 500-1,000 mg by mouth 3 (three) times daily as needed for mild pain.   Marland Kitchen. albuterol (ACCUNEB) 0.63 MG/3ML nebulizer solution Take 3 mLs by nebulization 3 (three) times daily as needed for wheezing or shortness of breath.  Marland Kitchen. albuterol (PROVENTIL HFA;VENTOLIN HFA) 108 (90 BASE) MCG/ACT inhaler Inhale 2 puffs into the lungs every 6 (six) hours as needed for wheezing or shortness of breath.  Marland Kitchen. amLODipine (NORVASC) 10 MG tablet Take 1 tablet (10 mg total) by mouth daily.  Marland Kitchen. aspirin EC 81 MG tablet Take 81 mg by mouth daily.  Marland Kitchen.  atorvastatin (LIPITOR) 10 MG tablet Take 10 mg by mouth daily.  Marland Kitchen. atorvastatin (LIPITOR) 80 MG tablet Take 1 tablet (80 mg total) by mouth daily. (Patient not taking: Reported on 02/08/2018)  . budesonide-formoterol (SYMBICORT) 80-4.5 MCG/ACT inhaler Inhale 2 puffs into the lungs 2 (two) times daily.  . citalopram (CELEXA) 20 MG tablet Take 20 mg by mouth daily.  . diphenhydrAMINE (BENADRYL) 25 MG tablet Take 1 tablet (25 mg total) by mouth every 6 (six) hours.  . docusate sodium (COLACE) 100 MG capsule Take 1 capsule (100 mg total) by mouth every 12 (twelve) hours.  Marland Kitchen. EPINEPHrine 0.3 mg/0.3 mL IJ SOAJ injection Inject 0.3 mg into the muscle See admin instructions. Inject once as needed for allergic reaction. May repeat dose x1 after 5-15 minutes if needed.  . furosemide (LASIX) 40 MG tablet Take 40 mg by mouth 2 (two) times daily.  Marland Kitchen. guaiFENesin (ROBITUSSIN) 100 MG/5ML SOLN Take 5 mLs by mouth every 4 (four) hours as needed for cough or to loosen phlegm.  Marland Kitchen. HYDROcodone-acetaminophen (NORCO/VICODIN) 5-325 MG tablet Take 1 tablet by mouth every 4 (four) hours as needed.  . Melatonin 5 MG TABS Take 3 mg by mouth at bedtime as needed (insomnia).   . Menthol, Topical Analgesic, (BIOFREEZE EX) Apply 1  application topically 3 (three) times daily as needed (joint pain).  . metoprolol (LOPRESSOR) 100 MG tablet Take 1 tablet (100 mg total) by mouth 2 (two) times daily. (Patient not taking: Reported on 02/08/2018)  . metoprolol tartrate (LOPRESSOR) 50 MG tablet Take 75 mg by mouth See admin instructions. Take 1 1/2 tablets (75mg ) twice daily  . polyethylene glycol powder (GLYCOLAX/MIRALAX) powder Take 17 g by mouth daily. Until daily soft stools  OTC (Patient not taking: Reported on 02/08/2018)  . predniSONE (DELTASONE) 10 MG tablet Take 6 tablets (60 mg total) by mouth daily.  . sennosides-docusate sodium (SENOKOT-S) 8.6-50 MG tablet Take 1-2 tablets by mouth 2 (two) times daily.  Marland Kitchen. spironolactone (ALDACTONE)  50 MG tablet Take 50 mg by mouth daily.  . [DISCONTINUED] losartan-hydrochlorothiazide (HYZAAR) 100-25 MG per tablet Take 1 tablet by mouth daily.       Allergies: Allergies as of 02/27/2019 - Review Complete 02/20/2019  Allergen Reaction Noted  . Lisinopril Swelling 11/15/2014  . Zolpidem tartrate  11/15/2014  . Eggs or egg-derived products  10/25/2013   Past Medical History:  Diagnosis Date  . 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 ASTHMA 04/16/2007  . 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 CORONARY ARTERY DISEASE 08/29/2008  . Environmental allergies   . Hyperlipidemia   . HYPERTENSION 04/16/2007  . Osteoarthritis   . Stroke (HCC)   . URI 10/25/2009    Family History:  Family History  Problem Relation Age of Onset  . Asthma Mother   . Asthma Sister      Social History:  Social History   Tobacco Use  . Smoking status: Never Smoker  . Smokeless tobacco: Never Used  Substance Use Topics  . Alcohol use: No  . Drug use: No    Review of Systems: A complete ROS was negative except as per HPI.   Physical Exam: Blood pressure (!) 126/55, pulse (!) 58, temperature 97.8 F (36.6 C), temperature source Oral, resp. rate 14, SpO2 96 %.  Physical Exam Constitutional:      Appearance: She is obese.  HENT:     Head: Normocephalic and atraumatic.  Cardiovascular:     Rate and Rhythm: Normal rate and regular rhythm.  Pulmonary:     Effort: No respiratory distress.     Breath sounds: Wheezing present.  Musculoskeletal:        General: No swelling or signs of injury.  Neurological:     Mental Status: She is alert.     Comments: Patient legally blind. Moves all 4 extremities , weakness in L.arm from prior stroke. Sensation equal in all 4 extremities to light touch  Psychiatric:        Mood and Affect: Mood normal.        Behavior: Behavior normal.     Assessment & Plan by Problem: Active Problems:   * No active hospital problems. *  [name] is a 73 y.o. female  with history of asthma, HTN, CVA, CKD stage 3, depression, albinism with macular degeneration and CAD who presents after a fall and found to have rhabdomyolysis. CK  7,172.   Rhabdomyolysis  Patient fell last nigh t and found down this morning by health aide.  - IV fluids - Trend CK - PT/OT  CAD - continue home statin, Asprin , metoprolol tartrate   Asthma Patient %sat 98 on RA. Wheezing bilaterally.  - schedule duoneb   Diet: Heart Healthy DVT: Lovenox  Code: DNI  Dispo: Admit patient to Observation with expected  length of stay less than 2 midnights.  Signed: Tamsen Snider, MD PGY1  [phone]

## 2019-02-27 NOTE — Care Management (Signed)
ED CM noted patient was in the ED last week s/p fall as well.  Patient to be active with [facility]. CM contacted Janalyn Shy on Call RN [phone]. Nurse is familiar with patient are will follow patient for transitional care planning from the ED. ED CM will continue to follow and provide updates

## 2019-02-27 NOTE — ED Notes (Signed)
Called main lab, added Hepatic Panel

## 2019-02-27 NOTE — ED Notes (Signed)
IV team at bedside 

## 2019-02-27 NOTE — Progress Notes (Signed)
Pt arrived to unit, placed on low bed due to being found down prior to admission - high fall risk. Pt is mildly confused, answers orientation questions correctly but is forgetful. Pt given bed bath: she has significant pain when moved in bed, especially around right hip. Skin check performed with Jocelyn Lamer, charge RN: lower legs are bilaterally dry and somewhat warm and reddened. MASD found in circular pattern on bilateral buttocks (pt was found in puddle of stool and urine). MASD also found in abdominal skin folds more to the right than the left: powder applied for now. Purwick in place.

## 2019-02-27 NOTE — ED Notes (Signed)
ED Provider at bedside. 

## 2019-02-27 NOTE — ED Notes (Signed)
ED Doctor at bedside.  

## 2019-02-27 NOTE — ED Triage Notes (Signed)
[facility] reports patient coming from home and she was found in the kitchen on the floor by her morning home health aid. We do not know how long she was on the floor. She was laying on her right side in a pool of urine and feces. She has been confused and is normally oriented. Her current complaint is right -sided arm and hip pain.

## 2019-02-27 NOTE — ED Notes (Signed)
Patient transported to CT 

## 2019-02-28 DIAGNOSIS — H547 Unspecified visual loss: Secondary | ICD-10-CM

## 2019-02-28 DIAGNOSIS — Z79899 Other long term (current) drug therapy: Secondary | ICD-10-CM

## 2019-02-28 DIAGNOSIS — I129 Hypertensive chronic kidney disease with stage 1 through stage 4 chronic kidney disease, or unspecified chronic kidney disease: Secondary | ICD-10-CM

## 2019-02-28 DIAGNOSIS — Z8673 Personal history of transient ischemic attack (TIA), and cerebral infarction without residual deficits: Secondary | ICD-10-CM

## 2019-02-28 DIAGNOSIS — W19XXXA Unspecified fall, initial encounter: Secondary | ICD-10-CM | POA: Diagnosis present

## 2019-02-28 DIAGNOSIS — M79672 Pain in left foot: Secondary | ICD-10-CM

## 2019-02-28 DIAGNOSIS — T796XXA Traumatic ischemia of muscle, initial encounter: Principal | ICD-10-CM

## 2019-02-28 DIAGNOSIS — Z7982 Long term (current) use of aspirin: Secondary | ICD-10-CM

## 2019-02-28 DIAGNOSIS — M79671 Pain in right foot: Secondary | ICD-10-CM

## 2019-02-28 DIAGNOSIS — I2510 Atherosclerotic heart disease of native coronary artery without angina pectoris: Secondary | ICD-10-CM

## 2019-02-28 DIAGNOSIS — H3530 Unspecified macular degeneration: Secondary | ICD-10-CM

## 2019-02-28 DIAGNOSIS — F329 Major depressive disorder, single episode, unspecified: Secondary | ICD-10-CM

## 2019-02-28 DIAGNOSIS — J45909 Unspecified asthma, uncomplicated: Secondary | ICD-10-CM

## 2019-02-28 DIAGNOSIS — E7030 Albinism, unspecified: Secondary | ICD-10-CM

## 2019-02-28 DIAGNOSIS — N183 Chronic kidney disease, stage 3 (moderate): Secondary | ICD-10-CM

## 2019-02-28 LAB — BASIC METABOLIC PANEL
Anion gap: 10 (ref 5–15)
Anion gap: 11 (ref 5–15)
Anion gap: 5 (ref 5–15)
Anion gap: 8 (ref 5–15)
BUN: 14 mg/dL (ref 8–23)
BUN: 10 mg/dL (ref 8–23)
BUN: 10 mg/dL (ref 8–23)
BUN: 11 mg/dL (ref 8–23)
CO2: 26 mmol/L (ref 22–32)
CO2: 25 mmol/L (ref 22–32)
CO2: 26 mmol/L (ref 22–32)
CO2: 25 mmol/L (ref 22–32)
Calcium: 8.6 mg/dL — ABNORMAL LOW (ref 8.9–10.3)
Calcium: 8.4 mg/dL — ABNORMAL LOW (ref 8.9–10.3)
Calcium: 8.3 mg/dL — ABNORMAL LOW (ref 8.9–10.3)
Calcium: 8.5 mg/dL — ABNORMAL LOW (ref 8.9–10.3)
Chloride: 106 mmol/L (ref 98–111)
Chloride: 105 mmol/L (ref 98–111)
Chloride: 108 mmol/L (ref 98–111)
Chloride: 107 mmol/L (ref 98–111)
Creatinine, Ser: 1 mg/dL (ref 0.44–1.00)
Creatinine, Ser: 0.92 mg/dL (ref 0.44–1.00)
Creatinine, Ser: 0.95 mg/dL (ref 0.44–1.00)
Creatinine, Ser: 0.97 mg/dL (ref 0.44–1.00)
GFR calc Af Amer: >60 mL/min (ref >60)
GFR calc Af Amer: >60 mL/min (ref >60)
GFR calc Af Amer: >60 mL/min (ref >60)
GFR calc Af Amer: >60 mL/min (ref >60)
GFR calc non Af Amer: 57 mL/min — ABNORMAL LOW (ref >60)
GFR calc non Af Amer: >60 mL/min (ref >60)
GFR calc non Af Amer: >60 mL/min (ref >60)
GFR calc non Af Amer: 59 mL/min — ABNORMAL LOW (ref >60)
Glucose, Bld: 103 mg/dL — ABNORMAL HIGH (ref 70–99)
Glucose, Bld: 95 mg/dL (ref 70–99)
Glucose, Bld: 107 mg/dL — ABNORMAL HIGH (ref 70–99)
Glucose, Bld: 104 mg/dL — ABNORMAL HIGH (ref 70–99)
Potassium: 3.5 mmol/L (ref 3.5–5.1)
Potassium: 3.6 mmol/L (ref 3.5–5.1)
Potassium: 3.8 mmol/L (ref 3.5–5.1)
Potassium: 3.8 mmol/L (ref 3.5–5.1)
Sodium: 142 mmol/L (ref 135–145)
Sodium: 141 mmol/L (ref 135–145)
Sodium: 139 mmol/L (ref 135–145)
Sodium: 140 mmol/L (ref 135–145)

## 2019-02-28 LAB — HEPATIC FUNCTION PANEL
ALT: 92 U/L — ABNORMAL HIGH (ref 0–44)
AST: 239 U/L — ABNORMAL HIGH (ref 15–41)
Albumin: 2.9 g/dL — ABNORMAL LOW (ref 3.5–5.0)
Alkaline Phosphatase: 82 U/L (ref 38–126)
Bilirubin, Direct: 0.2 mg/dL (ref 0.0–0.2)
Indirect Bilirubin: 0.8 mg/dL (ref 0.3–0.9)
Total Bilirubin: 1 mg/dL (ref 0.3–1.2)
Total Protein: 5.5 g/dL — ABNORMAL LOW (ref 6.5–8.1)

## 2019-02-28 LAB — CK
Total CK: 7587 U/L — ABNORMAL HIGH (ref 38–234)
Total CK: 6342 U/L — ABNORMAL HIGH (ref 38–234)
Total CK: 6222 U/L — ABNORMAL HIGH (ref 38–234)
Total CK: 5725 U/L — ABNORMAL HIGH (ref 38–234)

## 2019-02-28 LAB — MAGNESIUM: Magnesium: 1.6 mg/dL — ABNORMAL LOW (ref 1.7–2.4)

## 2019-02-28 MED ORDER — KETOROLAC TROMETHAMINE 15 MG/ML IJ SOLN
15.0000 mg | Freq: Once | INTRAMUSCULAR | Status: AC
  Administered 2019-02-28: 15 mg via INTRAVENOUS
  Filled 2019-02-28: qty 1

## 2019-02-28 MED ORDER — POTASSIUM CHLORIDE CRYS ER 20 MEQ PO TBCR
40.0000 meq | EXTENDED_RELEASE_TABLET | Freq: Once | ORAL | Status: AC
  Administered 2019-02-28: 40 meq via ORAL
  Filled 2019-02-28: qty 2

## 2019-02-28 MED ORDER — MAGNESIUM SULFATE 2 GM/50ML IV SOLN
2.0000 g | Freq: Once | INTRAVENOUS | Status: AC
  Administered 2019-02-28: 2 g via INTRAVENOUS
  Filled 2019-02-28: qty 50

## 2019-02-28 MED ORDER — CYCLOBENZAPRINE HCL 10 MG PO TABS
10.0000 mg | ORAL_TABLET | Freq: Three times a day (TID) | ORAL | Status: DC | PRN
  Administered 2019-02-28 – 2019-03-03 (×3): 10 mg via ORAL
  Filled 2019-02-28 (×3): qty 1

## 2019-02-28 NOTE — Evaluation (Signed)
Physical Therapy Evaluation Patient Details Name: [name] MRN: [id number] DOB: 03-21-1946 Today's Date: 02/28/2019   History of Present Illness   [name] is a 73 y.o. female with history of asthma, HTN, CVA, CKD stage 3, depression, albinism with macular degeneration and CAD who presents after a fal    Clinical Impression  Patient admitted with the above listed diagnosis. Patient reports she is [facility] at [facility] with some ADLs. Patient reporting B LE pain, however tolerable to touch as well as weight bearing once in standing. Patient requires up to Mod A for trunk control to come into sitting with Min A +2 for sit to stand and stand pivot transfer to recliner - cueing throughout for safety and sequencing. PT to currently recommend SNF at discharge based on current functional status with PT to continue to follow acutely.      Follow Up Recommendations SNF;Supervision/Assistance - 24 hour    Equipment Recommendations  Other (comment)(defer)    Recommendations for Other Services       Precautions / Restrictions Precautions Precautions: Fall Restrictions Weight Bearing Restrictions: No      Mobility  Bed Mobility Overal bed mobility: Needs Assistance Bed Mobility: Supine to Sit     Supine to sit: Mod assist     General bed mobility comments: Mod A to come to bedside- pateint able to walk feet to EOB then requires Mod A for trunk righting and to bring hips to EOB  Transfers Overall transfer level: Needs assistance Equipment used: Rolling walker (2 wheeled) Transfers: Sit to/from [employer] CorporationStand;Stand Pivot Transfers Sit to Stand: Min assist;+2 physical assistance Stand pivot transfers: Min assist;+2 physical assistance       General transfer comment: Min A +2 to stand from bedside; good weight acceptance onto B LE without increase in pain; cueing for safety and sequencing  Ambulation/Gait             General  Gait Details: deferred  Stairs            Wheelchair Mobility    Modified Rankin (Stroke Patients Only)       Balance Overall balance assessment: Needs assistance Sitting-balance support: Single extremity supported;Feet supported Sitting balance-Leahy Scale: Fair   Postural control: Right lateral lean(states this is baseline) Standing balance support: Bilateral upper extremity supported;During functional activity Standing balance-Leahy Scale: Poor Standing balance comment: reliant on RW                             Pertinent Vitals/Pain Pain Assessment: Faces Faces Pain Scale: Hurts little more Pain Location: bilateral feet Pain Descriptors / Indicators: Constant;Cramping;Grimacing;Guarding;Moaning;Other (Comment) Pain Intervention(s): Limited activity within patient's tolerance;Monitored during session;Repositioned    Home Living Family/patient expects to be discharged to:: Private residence Living Arrangements: Alone Available Help at Discharge: Family;Home health;Available PRN/intermittently Type of Home: Apartment Home Access: Level entry     Home Layout: One level Home Equipment: Walker - 2 wheels;Shower seat;Bedside commode;Cane - single point Additional Comments: history of falls    Prior Function Level of Independence: Needs assistance   Gait / Transfers Assistance Needed: pt reports she was using a RW/SPC for mobility;per chart pt was using a w/c for mobility prior to admission  ADL's / Homemaking Assistance Needed: pt reports her aide assists with all ADL/IADL        Hand Dominance   Dominant Hand: Right    Extremity/Trunk Assessment  Upper Extremity Assessment Upper Extremity Assessment: Defer to OT evaluation    Lower Extremity Assessment Lower Extremity Assessment: Generalized weakness    Cervical / Trunk Assessment Cervical / Trunk Assessment: Kyphotic(+ R lateral lean)  Communication   Communication: No difficulties   Cognition Arousal/Alertness: Awake/alert Behavior During Therapy: WFL for tasks assessed/performed Overall Cognitive Status: No family/caregiver present to determine baseline cognitive functioning                                        General Comments General comments (skin integrity, edema, etc.): very red buttocks - nursing in room and aware    Exercises     Assessment/Plan    PT Assessment Patient needs continued PT services  PT Problem List Decreased strength;Decreased activity tolerance;Decreased balance;Decreased mobility;Decreased coordination;Decreased knowledge of use of DME;Decreased safety awareness       PT Treatment Interventions DME instruction;Gait training;Functional mobility training;Therapeutic activities;Therapeutic exercise;Balance training;Patient/family education    PT Goals (Current goals can be found in the Care Plan section)  Acute Rehab PT Goals Patient Stated Goal: to not be in pain PT Goal Formulation: With patient Time For Goal Achievement: 03/14/19 Potential to Achieve Goals: Good    Frequency Min 3X/week   Barriers to discharge        Co-evaluation               AM-PAC PT "6 Clicks" Mobility  Outcome Measure Help needed turning from your back to your side while in a flat bed without using bedrails?: A Little Help needed moving from lying on your back to sitting on the side of a flat bed without using bedrails?: A Little Help needed moving to and from a bed to a chair (including a wheelchair)?: A Lot Help needed standing up from a chair using your arms (e.g., wheelchair or bedside chair)?: A Little Help needed to walk in hospital room?: A Lot Help needed climbing 3-5 steps with a railing? : Total 6 Click Score: 14    End of Session Equipment Utilized During Treatment: Gait belt Activity Tolerance: Patient tolerated treatment well Patient left: in chair;with call bell/phone within reach;with chair alarm set Nurse  Communication: Mobility status PT Visit Diagnosis: Unsteadiness on feet (R26.81);Other abnormalities of gait and mobility (R26.89);Muscle weakness (generalized) (M62.81)    Time: 5009-3818 PT Time Calculation (min) (ACUTE ONLY): 32 min   Charges:   PT Evaluation $PT Eval Moderate Complexity: 1 Mod PT Treatments $Therapeutic Activity: 8-22 mins        Lanney Gins, PT, DPT Supplemental Physical Therapist 02/28/19 3:35 PM Pager: [phone] Office: [phone]

## 2019-02-28 NOTE — Progress Notes (Signed)
  Date: 02/28/2019  Patient name: [name]  Medical record number: [id number]  Date of birth: 04-11-1946   I have seen and evaluated this patient and I have discussed the plan of care with the house staff. Please see their note for complete details. I concur with their findings with the following additions/corrections:   73 yo woman with complex medical history admitted after a fall with rhabdomyolysis. By description, likely mechanical fall, though transient loss of consciousness is a consideration. Remained on the ground until found in the morning by her aide, leading to rhabdomyolysis.  On exam today, she is screaming from paroxysmal pain and is highly sensitive to touch in both feet, which are warm and well perfused.  1. Rhabdomyolysis -Continue IV hydration, trending CK to <5000  2. Fall -Although likely mechanical with limited mobility and blindness, EKG and CT head reassuring, will continue telemetry to monitor for arrhythmia -PT/OT eval  3. Bilateral foot pain -Unclear etiology, apparently sudden onset this morning, unrelated to fall, differential is primarily ischemia vs muscle cramp vs neuropathy. No bowel/bladder incontinence, but if develops, will need to consider myelopathy. Feet are well perfused, no sign of ischemia. -For now, will treat with cyclobenzaprine given her description as cramping and track response  Lenice Pressman, M.D., Ph.D. 02/28/2019, 1:59 PM

## 2019-02-28 NOTE — Progress Notes (Signed)
Occupational Therapy Evaluation Patient Details Name: [name] MRN: [id number] DOB: 1946-04-17 Today's Date: 02/28/2019    History of Present Illness  [name] is a 73 y.o. female with history of asthma, HTN, CVA, CKD stage 3, depression, albinism with macular degeneration and CAD who presents after a fal   Clinical Impression   PTA, pt was living at home alone, and [facility] and pt reports using RW/SPC for functional mobility. Pt currently significantly limited secondary to pain, requiring modA+2-MaxA+2 for bed mobility to changes linens and totalA for pericare due to linens saturated with urine, pt was aware of saturation. Due to decline in current level of function, pt would benefit from acute OT to address established goals to facilitate safe D/C to venue listed below. At this time, recommend SNF follow-up. Will continue to follow acutely.     Follow Up Recommendations  SNF;Supervision/Assistance - 24 hour    Equipment Recommendations  3 in 1 bedside commode;Hospital bed    Recommendations for Other Services PT consult     Precautions / Restrictions Precautions Precautions: Fall Restrictions Weight Bearing Restrictions: No      Mobility Bed Mobility Overal bed mobility: Needs Assistance Bed Mobility: Rolling Rolling: Mod assist;Max assist;+2 for physical assistance         General bed mobility comments: soiled in urine, required modA+2 to MaxA+2 for rolling to complete pericare  Transfers                 General transfer comment: deferred secondary to pain    Balance Overall balance assessment: (deferred)                                         ADL either performed or assessed with clinical judgement   ADL Overall ADL's : Needs assistance/impaired Eating/Feeding: Set up;Sitting   Grooming: Set up;Sitting   Upper Body Bathing: Moderate assistance   Lower Body Bathing: Total  assistance   Upper Body Dressing : Moderate assistance Upper Body Dressing Details (indicate cue type and reason): modA +2 for rolling to don/doff gown Lower Body Dressing: Total assistance   Toilet Transfer: Total assistance   Toileting- Clothing Manipulation and Hygiene: Total assistance         General ADL Comments: upon arrival pt soiled in urine (with potent smell);required modA+2-maxA+2 for rolling to change linens, pt significantly limited by pain grimacing and moaning in pain at rest     Vision Baseline Vision/History: Glaucoma Patient Visual Report: No change from baseline       Perception     Praxis      Pertinent Vitals/Pain Pain Assessment: Faces Faces Pain Scale: Hurts worst Pain Location: bilateral feet Pain Descriptors / Indicators: Constant;Cramping;Grimacing;Guarding;Moaning;Other (Comment)(screaming) Pain Intervention(s): Limited activity within patient's tolerance;Monitored during session;Repositioned     Hand Dominance Right   Extremity/Trunk Assessment Upper Extremity Assessment Upper Extremity Assessment: Generalized weakness   Lower Extremity Assessment Lower Extremity Assessment: Defer to PT evaluation       Communication Communication Communication: No difficulties   Cognition Arousal/Alertness: Awake/alert Behavior During Therapy: WFL for tasks assessed/performed Overall Cognitive Status: No family/caregiver present to determine baseline cognitive functioning                                     General  Comments  educated pt on UE and LE mobility while in bed to maintain strength and ROM;pt winced in pain with posterior pericare;pt significantly limited by pain in bilateral feet;RN and NT aware;medical team present during session aware of pt's pain     Exercises     Shoulder Instructions      Home Living Family/patient expects to be discharged to:: Private residence Living Arrangements: Alone Available Help at  Discharge: Family;Home health;Available PRN/intermittently Type of Home: Apartment Home Access: Level entry     Home Layout: One level     Bathroom Shower/Tub: Occupational psychologist: Handicapped height     Home Equipment: Environmental consultant - 2 wheels;Shower seat;Bedside commode;Cane - single point   Additional Comments: history of falls      Prior Functioning/Environment Level of Independence: Needs assistance  Gait / Transfers Assistance Needed: pt reports she was using a RW/SPC for mobility;per chart pt was using a w/c for mobility prior to admission ADL's / Homemaking Assistance Needed: pt reports her aide assists with all ADL/IADL            OT Problem List: Decreased strength;Decreased range of motion;Decreased activity tolerance;Impaired balance (sitting and/or standing);Decreased safety awareness;Decreased knowledge of use of DME or AE;Decreased knowledge of precautions;Obesity;Pain      OT Treatment/Interventions: Self-care/ADL training;Therapeutic exercise;Energy conservation;DME and/or AE instruction;Modalities;Therapeutic activities;Patient/family education;Balance training    OT Goals(Current goals can be found in the care plan section) Acute Rehab OT Goals Patient Stated Goal: to not be in pain OT Goal Formulation: With patient Time For Goal Achievement: 03/14/19 Potential to Achieve Goals: Good ADL Goals Pt Will Perform Grooming: with modified independence;standing Pt Will Perform Upper Body Dressing: with modified independence Pt Will Perform Lower Body Dressing: with set-up;with adaptive equipment;sit to/from stand Pt Will Transfer to Toilet: with modified independence;ambulating  OT Frequency: Min 2X/week   Barriers to D/C: Decreased caregiver support  pt lives alone       Co-evaluation              AM-PAC OT "6 Clicks" Daily Activity     Outcome Measure Help from another person eating meals?: A Little Help from another person taking care  of personal grooming?: A Little Help from another person toileting, which includes using toliet, bedpan, or urinal?: Total Help from another person bathing (including washing, rinsing, drying)?: A Lot Help from another person to put on and taking off regular upper body clothing?: A Lot Help from another person to put on and taking off regular lower body clothing?: Total 6 Click Score: 12   End of Session Nurse Communication: Mobility status;Other (comment)(pt's pain)  Activity Tolerance: Patient limited by pain Patient left: in bed;with call bell/phone within reach;with bed alarm set  OT Visit Diagnosis: Unsteadiness on feet (R26.81);Other abnormalities of gait and mobility (R26.89);Repeated falls (R29.6);Muscle weakness (generalized) (M62.81);History of falling (Z91.81);Low vision, both eyes (H54.2);Pain Pain - Right/Left: (bilateral) Pain - part of body: (feet)                Time: 1610-9604 OT Time Calculation (min): 27 min Charges:  OT General Charges $OT Visit: 1 Visit OT Evaluation $OT Eval Moderate Complexity: 1 Mod OT Treatments $Self Care/Home Management : 8-22 mins  Dorinda Hill OTR/L Acute Rehabilitation Services Office: [phone]   Wyn Forster 02/28/2019, 10:06 AM

## 2019-02-28 NOTE — Progress Notes (Signed)
   Subjective:   [name] is in pain on exam. She describes pain starting at her knees and going through the bottom of her feet. The pain come is rhythmic patterns it seems and started this morning. Denies this ever happening before.   Objective:  Vital signs in last 24 hours: Vitals:   02/27/19 1530 02/27/19 2121 02/28/19 0538 02/28/19 0816  BP: (!) 123/49 (!) 125/59 (!) 118/59   Pulse:  79 86   Resp: 15 18 18    Temp:  98.9 F (37.2 C) 98.6 F (37 C)   TempSrc:  Oral    SpO2: 96% 99% 97% 96%   Physical Exam Constitutional:      General: She is in acute distress.     Appearance: She is obese.  Cardiovascular:     Rate and Rhythm: Normal rate and regular rhythm.  Abdominal:     General: Bowel sounds are normal.  Musculoskeletal:        General: Tenderness present. No swelling or signs of injury.     Comments: Pain out of proportion. Cramps not visible on exam, but patient seems to experience intensity in rhythmic pattern.   Skin:    General: Skin is warm and dry.     Findings: Erythema present.     Comments: Bilaterally erythema on ankles   Neurological:     Mental Status: She is alert.      Assessment/Plan:  Active Problems:   Rhabdomyolysis  [name] is a 73 y.o. female with history of asthma, HTN, CVA, CKD stage 3, depression, albinism with macular degeneration and CAD who presents after a fall.  Patient fell and found down the next morning by health aide. AST , ALT, and elevated  CK 7,172. Imaging in the ED including CT cervical spine, CT head wo contrast, xray L. And R. Shoulder, xray Tibia/Fibula , and xray knee all without acute injury.   Rhabdomyolysis  Patient fell and found down the next morning by health aide. CK 7172 > 6342 today.  - IV fluids - Trend CK   - PT/OT  Foot pain Bilaterally pain below knee and going through the bottom of her feet. The pain seems to intensifies in rhythmic patterns and likely cramps with acute onset. Lower  extremities are warm and dry. Pain started this morning. Calcium 8.4 not corrected. Will get CMP and Mag with noon labs.  - flexeril 10mg  once. Will monitor to see if it relieves pain.  - f/u CMP,Mag  CAD - continue home statin, Asprin , metoprolol tartrate   Asthma Patient %sat 98 on RA. Wheezing bilaterally.  - schedule duoneb   Diet: Heart Healthy  DVT: Lovenox  Code: DNI  Dispo: Anticipated discharge in approximately 2-3  day(s).   Tamsen Snider, MD PGY1  [phone]

## 2019-03-01 LAB — BASIC METABOLIC PANEL
Anion gap: 7 (ref 5–15)
BUN: 8 mg/dL (ref 8–23)
CO2: 23 mmol/L (ref 22–32)
Calcium: 8.2 mg/dL — ABNORMAL LOW (ref 8.9–10.3)
Chloride: 112 mmol/L — ABNORMAL HIGH (ref 98–111)
Creatinine, Ser: 0.87 mg/dL (ref 0.44–1.00)
GFR calc Af Amer: >60 mL/min (ref >60)
GFR calc non Af Amer: >60 mL/min (ref >60)
Glucose, Bld: 104 mg/dL — ABNORMAL HIGH (ref 70–99)
Potassium: 3.7 mmol/L (ref 3.5–5.1)
Sodium: 142 mmol/L (ref 135–145)

## 2019-03-01 LAB — MAGNESIUM: Magnesium: 1.8 mg/dL (ref 1.7–2.4)

## 2019-03-01 LAB — CK: Total CK: 3134 U/L — ABNORMAL HIGH (ref 38–234)

## 2019-03-01 LAB — HIV ANTIBODY (ROUTINE TESTING W REFLEX): HIV Screen 4th Generation wRfx: NONREACTIVE

## 2019-03-01 NOTE — TOC Initial Note (Signed)
Transition of Care [facility]) - Initial/Assessment Note    Patient Details  Name: [name] MRN: [id number] Date of Birth: [birth year]  [facility] CM/SW Contact:    Benard Halsted, LCSW Phone Number: 03/01/2019, 4:51 PM  Clinical Narrative:                 CSW is assisting with discharge plan. Per [facility], they do not feel patient would be safe at home and request that we try to convince patient to go to [facility] (only SNF in contract with [facility] accepting patients). CSW notified patient's daughter, who will try to convince patient to agree to go. Also awaiting response from [facility] on if they will have a bed and if they need an updated COVID test.  Expected Discharge Plan: [place] Barriers to Discharge: No SNF bed   Patient Goals and CMS Choice Patient states their goals for this hospitalization and ongoing recovery are:: Return home CMS Medicare.gov Compare Post Acute Care list provided to:: Patient Choice offered to / list presented to : Patient  Expected Discharge Plan and Services Expected Discharge Plan: [place] In-house Referral: Clinical Social Work Discharge Planning Services: NA Post Acute Care Choice: [place] Living arrangements for the past 2 months: Apartment                           HH Arranged: NA          Prior Living Arrangements/Services Living arrangements for the past 2 months: Apartment Lives with:: Self Patient language and need for interpreter reviewed:: Yes Do you feel safe going back to the place where you live?: Yes      Need for Family Participation in Patient Care: Yes (Comment) Care giver support system in place?: Yes (comment) Current home services: DME, Other (comment)([facility]) Criminal Activity/Legal Involvement Pertinent to Current Situation/Hospitalization: No - Comment as needed  Activities of Daily Living      Permission Sought/Granted Permission sought to share  information with : Facility Sport and exercise psychologist, Family Supports    Share Information with NAME: [name]  Permission granted to share info w AGENCY: SNFs  Permission granted to share info w Relationship: Daughter  Permission granted to share info w Contact Information: [phone]  Emotional Assessment Appearance:: Appears stated age Attitude/Demeanor/Rapport: Engaged Affect (typically observed): Accepting, Appropriate Orientation: : Oriented to Self, Oriented to Place, Oriented to  Time, Oriented to Situation Alcohol / Substance Use: Not Applicable Psych Involvement: No (comment)  Admission diagnosis:  Fall [W19.XXXA] Fall, initial encounter [id number]] Traumatic rhabdomyolysis, initial encounter ([place]) [T79.6XXA] Patient Active Problem List   Diagnosis Date Noted  . Fall 02/28/2019  . Bilateral foot pain 02/28/2019  . Rhabdomyolysis 02/27/2019  . Impaired glucose tolerance 12/14/2013  . Obesity 12/14/2013  . URI 10/25/2009  . Coronary atherosclerosis 08/29/2008  . Essential hypertension 04/16/2007  . ASTHMA 04/16/2007  . 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 History of cardiovascular disorder 04/16/2007   PCP:  Angelica Pou, MD Pharmacy:   [id number] [place] [facility], [place] [address] Phone: [phone] Fax: [phone]     Social Determinants of Health (SDOH) Interventions    Readmission Risk Interventions No flowsheet data found.

## 2019-03-01 NOTE — NC FL2 (Signed)
[place] LEVEL OF CARE SCREENING TOOL     IDENTIFICATION  Patient Name: [name] Birthdate: 1945/09/17 Sex: female Admission Date (Current Location): 02/27/2019  [facility] and [place] Number:  Herbalist and Address:  [place], [place] [address], [place], [place] [address]      Provider Number: [id number]  Attending Physician Name and Address:  Oda Kilts, MD  Relative Name and Phone Number:  Janace Hoard, daughter, [phone]    Current Level of Care: Hospital Recommended Level of Care: [place] Prior Approval Number:    Date Approved/Denied:   PASRR Number: [id number]  Discharge Plan: SNF    Current Diagnoses: Patient Active Problem List   Diagnosis Date Noted  . Fall 02/28/2019  . Bilateral foot pain 02/28/2019  . Rhabdomyolysis 02/27/2019  . Impaired glucose tolerance 12/14/2013  . Obesity 12/14/2013  . URI 10/25/2009  . Coronary atherosclerosis 08/29/2008  . Essential hypertension 04/16/2007  . ASTHMA 04/16/2007  . 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 History of cardiovascular disorder 04/16/2007    Orientation RESPIRATION BLADDER Height & Weight     Self, Time, Situation, Place  Normal Incontinent, External catheter Weight: 260 lb (117.9 kg) Height:     BEHAVIORAL SYMPTOMS/MOOD NEUROLOGICAL BOWEL NUTRITION STATUS      Continent Diet(Please see DC Summary)  AMBULATORY STATUS COMMUNICATION OF NEEDS Skin   Extensive Assist Verbally Normal                       Personal Care Assistance Level of Assistance  Bathing, Feeding, Dressing Bathing Assistance: Maximum assistance Feeding assistance: Maximum assistance Dressing Assistance: Maximum assistance     Functional Limitations Info  Sight, Hearing, Speech Sight Info: Impaired Hearing Info: Adequate Speech Info: Adequate    SPECIAL CARE FACTORS FREQUENCY  PT (By licensed PT), OT (By licensed OT)     PT Frequency: 5x OT  Frequency: 3x            Contractures Contractures Info: Not present    Additional Factors Info  Code Status, Allergies Code Status Info: Partial Allergies Info: Lisinopril, Zolpidem Tartrate, Eggs Or Egg-derived Products           Current Medications (03/01/2019):  This is the current hospital active medication list Current Facility-Administered Medications  Medication Dose Route Frequency Provider Last Rate Last Dose  . albuterol (PROVENTIL) (2.5 MG/3ML) 0.083% nebulizer solution 2.5 mg  2.5 mg Nebulization Q6H PRN Neva Seat, MD      . aspirin EC tablet 81 mg  81 mg Oral Daily Neva Seat, MD   81 mg at 03/01/19 0901  . atorvastatin (LIPITOR) tablet 10 mg  10 mg Oral q1800 Neva Seat, MD   10 mg at 02/28/19 1834  . cyclobenzaprine (FLEXERIL) tablet 10 mg  10 mg Oral TID PRN Neva Seat, MD   10 mg at 02/28/19 7353  . enoxaparin (LOVENOX) injection 40 mg  40 mg Subcutaneous Q24H Neva Seat, MD   40 mg at 02/28/19 1836  . fluticasone (FLONASE) 50 MCG/ACT nasal spray 1 spray  1 spray Each Nare Daily Neva Seat, MD   1 spray at 03/01/19 [phone]  . guaiFENesin (ROBITUSSIN) 100 MG/5ML solution 100 mg  5 mL Oral Q4H PRN Neva Seat, MD      . loratadine (CLARITIN) tablet 10 mg  10 mg Oral Daily Neva Seat, MD   10 mg at 03/01/19 0902  . Melatonin TABS 6 mg  6  mg Oral QHS PRN Anne Shutteraines, Alexander N, MD      . metoprolol tartrate (LOPRESSOR) tablet 75 mg  75 mg Oral BID Beola CordMelvin, Alexander, MD   75 mg at 03/01/19 0902  . mometasone-formoterol (DULERA) 200-5 MCG/ACT inhaler 2 puff  2 puff Inhalation BID Beola CordMelvin, Alexander, MD   2 puff at 03/01/19 [phone]  . senna-docusate (Senokot-S) tablet 1-2 tablet  1-2 tablet Oral BID Beola CordMelvin, Alexander, MD   1 tablet at 03/01/19 0902  . sodium chloride flush (NS) 0.9 % injection 3 mL  3 mL Intravenous Q12H Beola CordMelvin, Alexander, MD   3 mL at 02/28/19 2240  . spironolactone (ALDACTONE) tablet 25 mg  25 mg Oral Daily  Beola CordMelvin, Alexander, MD   25 mg at 03/01/19 0902     Discharge Medications: Please see discharge summary for a list of discharge medications.  Relevant Imaging Results:  Relevant Lab Results:   Additional Information SSN: [id number]   COVID Negative on 02/27/19  Mearl LatinNadia S Sundiata Ferrick, LCSW

## 2019-03-01 NOTE — Progress Notes (Signed)
Paula from [facility] called for an update on patient and was updated.  zpatient continue to state that she is going home not SNF.  At the moment she is in bed resting.  Will continue to monitor.

## 2019-03-01 NOTE — Progress Notes (Signed)
Physical Therapy Treatment Patient Details Name: [name] MRN: [id number] DOB: 1946/01/29 Today's Date: 03/01/2019    History of Present Illness  [name] is a 73 y.o. female with history of asthma, HTN, CVA, CKD stage 3, depression, albinism with macular degeneration and CAD who presents after a fal    PT Comments    Pt was laying in wet bed when I arrived - hadnt asked to be changed.  Pt needed max encouragement to get OOB.  Pt needed a lot of help to get to EOB, stand and get cleaned up to sit in chair. She was tired from all of this.  Pt leaning to right entire time.  Pt talking about wanting to go home - with her meals on wheels, pace and aide.  At this point I cant picture her caring for herself.  Pt buttocks are bright red (cleaned and barrier cream added).  Pt at high risk for skin breakdown - needs to stay dry.  Pt says she will do fine at home. s he did bear weight OK on legs when she got up (leaning on RW).  I am going to have to stand by PT recommendation for SNF care for safest DC plan - from what I saw today.  I informed pt and nursing.    Follow Up Recommendations  SNF;Supervision/Assistance - 24 hour     Equipment Recommendations  None recommended by PT    Recommendations for Other Services       Precautions / Restrictions Precautions Precautions: Fall    Mobility  Bed Mobility Overal bed mobility: Needs Assistance Bed Mobility: Supine to Sit Rolling: Mod assist   Supine to sit: Mod assist     General bed mobility comments: pt needed mod assist to sit up and then she tried and tried to scoot herself forward but finally i had to pull the chuck to help get her hips closer to the EOB.  Transfers Overall transfer level: Needs assistance Equipment used: Rolling walker (2 wheeled) Transfers: Sit to/from UGI CorporationStand;Stand Pivot Transfers Sit to Stand: Min assist;From elevated surface Stand pivot transfers: Min assist;+2 physical assistance        General transfer comment: Pt stood on side of bed 3 times. s he needed min assist to get up from elevated surface. she stood with RW and needed physical assist to get right hand on the RW handle. she continued to lean to the right in sitting and in standing.  pt rocked back and forth (knees locked) to move feet to turn to sit down in the chair.  pt was wet so nursing helped remove her gown, hospital pants and wash up in standing.  pts bottom is red (from staying wet) - nursing added barrier cream.  pt stood in flexed posture - leaning to the left while standing and getting cleaned up.  pt fatiqued after this  Ambulation/Gait                 Stairs             Wheelchair Mobility    Modified Rankin (Stroke Patients Only)       Balance Overall balance assessment: Needs assistance;History of Falls     Sitting balance - Comments: pt leaning to the right all the time in sitting -  i had her lean to the left and she stayed there for 10 seconds only       Standing balance comment: pt reliant on RW when standing -  and she leaned forward and to the right                            Cognition Arousal/Alertness: Awake/alert Behavior During Therapy: WFL for tasks assessed/performed Overall Cognitive Status: No family/caregiver present to determine baseline cognitive functioning                                 General Comments: pt laying in bed withi purewick but all the bed wet and pt didnt call for help to get bed changed      Exercises      General Comments General comments (skin integrity, edema, etc.): again very red buttocks - nursing cleaned pt and put on barrier cream.  pt at high risk for skin breakdown - as laying in wet bed and had not asked to be changed etc      Pertinent Vitals/Pain Pain Assessment: 0-10 Pain Score: 5  Pain Location: bilateral feet Pain Descriptors / Indicators: Constant;Cramping;Grimacing;Guarding;Moaning;Other  (Comment) Pain Intervention(s): Limited activity within patient's tolerance;Repositioned;Monitored during session    Home Living                      Prior Function            PT Goals (current goals can now be found in the care plan section) Progress towards PT goals: Progressing toward goals    Frequency    Min 3X/week      PT Plan Current plan remains appropriate    Co-evaluation              AM-PAC PT "6 Clicks" Mobility   Outcome Measure  Help needed turning from your back to your side while in a flat bed without using bedrails?: A Lot Help needed moving from lying on your back to sitting on the side of a flat bed without using bedrails?: A Lot Help needed moving to and from a bed to a chair (including a wheelchair)?: A Lot Help needed standing up from a chair using your arms (e.g., wheelchair or bedside chair)?: A Lot Help needed to walk in hospital room?: A Lot Help needed climbing 3-5 steps with a railing? : Total 6 Click Score: 11    End of Session Equipment Utilized During Treatment: Gait belt Activity Tolerance: Patient tolerated treatment well Patient left: in chair;with chair alarm set;with call bell/phone within reach Nurse Communication: Mobility status PT Visit Diagnosis: Unsteadiness on feet (R26.81);Other abnormalities of gait and mobility (R26.89);Muscle weakness (generalized) (M62.81);Pain     Time: 1400-1425 PT Time Calculation (min) (ACUTE ONLY): 25 min  Charges:  $Therapeutic Activity: 23-37 mins                    03/01/2019   Rande Lawman, PT    [name] 03/01/2019, 2:48 PM

## 2019-03-01 NOTE — Progress Notes (Signed)
   Subjective:   She states she is doing okay today. She still has some pain in her feet, but it is much better today  Patient states she would prefer not to go to SNF, but would prefer to go home because she feels she does better at home  Objective:  Vital signs in last 24 hours: Vitals:   02/28/19 2122 02/28/19 2136 03/01/19 0455 03/01/19 0457  BP: (!) 140/50   (!) 149/49  Pulse: 73   63  Resp: 18   18  Temp: 98.7 F (37.1 C)   98.4 F (36.9 C)  TempSrc: Oral   Oral  SpO2: 97% 95%  96%  Weight:   117.9 kg    Physical Exam Constitutional:      Appearance: She is obese.  HENT:     Nose: Nose normal.     Mouth/Throat:     Mouth: Mucous membranes are moist.  Cardiovascular:     Rate and Rhythm: Normal rate and regular rhythm.  Pulmonary:     Effort: Pulmonary effort is normal.     Breath sounds: Normal breath sounds.  Skin:    General: Skin is warm and dry.  Neurological:     Mental Status: She is alert.      Assessment/Plan:  Principal Problem:   Rhabdomyolysis Active Problems:   Fall   Bilateral foot pain  [name] is a 73 y.o. female with history of asthma, HTN, CVA, CKD stage 3, depression, albinism with macular degeneration and CAD who presents after a fall.  Patient fell and found down the next morning by health aide. AST , ALT, and elevated  CK 7,172. Imaging in the ED including CT cervical spine, CT head wo contrast, xray L. And R. Shoulder, xray Tibia/Fibula , and xray knee all without acute injury.   Rhabdomyolysis Patient fell and found down the next morning by health aide. CK 7172 > 3134 today. Patient feels well this morning. Ready for discharge pending recommendation.  - PT/OT  Foot pain Bilaterally pain below knee and going through the bottom of her feet. The pain seems to intensifies in rhythmic patterns and likely cramps with acute onset. Lower extremities are warm and dry. Pain started this morning. Calcium 8.4 not corrected.  Will get CMP and Mag with noon labs.  - resolved  CAD - continue home statin, Asprin , metoprolol tartrate   Asthma Patient %sat 98 on RA. Wheezing bilaterally.  - schedule duoneb  Diet: Heart Healthy  DVT: Lovenox  Code: DNI  Dispo: Anticipated discharge today.   Anita Snider, MD PGY1  [phone]

## 2019-03-01 NOTE — Progress Notes (Signed)
  Date: 03/01/2019  Patient name: [name]  Medical record number: [id number]  Date of birth: [birth year]   I have seen and evaluated this patient and I have discussed the plan of care with the house staff. Please see their note for complete details. I concur with their findings with the following additions/corrections:   Rhabdomyolysis is improving with IV fluids.  The main issue now is ensuring it is safe discharge plan.  PT/OT have recommended SNF for safety and rehab.  Unfortunately, she adamantly insists that she wants to go home and understands the safety risks.  She admits that she has had multiple falls recently.  With her most recent fall, she was not wearing her life alert, and so remained on the ground overnight.  We have continued to reinforce the recommendation for SNF, but she does retain capacity to make her own decisions and we will abide by her wishes.  Lenice Pressman, M.D., Ph.D. 03/01/2019, 3:54 PM

## 2019-03-02 LAB — BASIC METABOLIC PANEL
Anion gap: 7 (ref 5–15)
BUN: 8 mg/dL (ref 8–23)
CO2: 25 mmol/L (ref 22–32)
Calcium: 8.7 mg/dL — ABNORMAL LOW (ref 8.9–10.3)
Chloride: 109 mmol/L (ref 98–111)
Creatinine, Ser: 0.81 mg/dL (ref 0.44–1.00)
GFR calc Af Amer: >60 mL/min (ref >60)
GFR calc non Af Amer: >60 mL/min (ref >60)
Glucose, Bld: 108 mg/dL — ABNORMAL HIGH (ref 70–99)
Potassium: 4.1 mmol/L (ref 3.5–5.1)
Sodium: 141 mmol/L (ref 135–145)

## 2019-03-02 NOTE — Progress Notes (Signed)
   Subjective: Seen on rounds this AM. She appears comfortable resting in bed. In a joking manner, Pt says she was upset she didn't get to go home yesterday. Her daughter has convinced her to go to SNF. Had large BM yesterday. Feet are still tender but better than what it was. Ankle area more sore. She feels confident that she can walk.   Objective:  Vital signs in last 24 hours: Vitals:   03/01/19 2004 03/01/19 2115 03/02/19 0501 03/02/19 0728  BP:  (!) 157/61 (!) 160/62   Pulse:  82 70   Resp:  18 18   Temp:  98.7 F (37.1 C) 99.2 F (37.3 C)   TempSrc:  Oral Oral   SpO2: 97% 100% 96% 96%  Weight:   117 kg    Physical Exam Constitutional:      Appearance: She is obese.  HENT:     Head: Normocephalic and atraumatic.     Mouth/Throat:     Mouth: Mucous membranes are moist.  Cardiovascular:     Rate and Rhythm: Normal rate and regular rhythm.  Pulmonary:     Effort: Pulmonary effort is normal.     Breath sounds: Normal breath sounds.  Abdominal:     General: Bowel sounds are normal.     Palpations: Abdomen is soft.  Skin:    General: Skin is warm and dry.  Neurological:     Mental Status: She is alert.      Assessment/Plan:  Principal Problem:   Rhabdomyolysis Active Problems:   Fall   Bilateral foot pain  Mindee Robledo is a 73 y.o. female with history of asthma, HTN, CVA, CKD stage 3, depression, albinism with macular degeneration and CAD who presents after a fall.  Patient fell and found down the next morning by health aide. AST , ALT, and elevated CK 7,172. Imaging in the ED including CT cervical spine, CT head wo contrast, xray L. And R. Shoulder, xray Tibia/Fibula , and xray knee all without acute injury.   Rhabdomyolysis Patient fell and found down the nextmorning by health aide.CK 7172 > 3134 7/7. Patient feels well this morning. Ready for discharge to SNF. Patient initially wanted to go home against recommendations from PT/OT/MD to go to SNF.  After speaking to her daughter she is going to go to SNF.  -SW is currently working on placement at [facility] pending availability.   Foot pain Bilaterally pain below kneeand going through the bottom of her feet. The pain seems to intensifies in rhythmic patterns and likely cramps with acute onset. Lower extremities are warm and dry. Pain has gotten much better after IV fluids , one dose of flexeril, and magnesium.   - resolved  CAD - continue home statin, Asprin , metoprolol tartrate   Asthma Patient %sat 98 on RA. Wheezing bilaterally.  - schedule duoneb  Diet: Heart Healthy  DVT: Lovenox  Code: DNI   Dispo: Anticipated discharge today.   Tamsen Snider, MD PGY1  [phone]

## 2019-03-02 NOTE — Progress Notes (Signed)
  Date: 03/02/2019  Patient name: [name]  Medical record number: [id number]  Date of birth: 1946/08/03   I have seen and evaluated this patient and I have discussed the plan of care with the house staff. Please see their note for complete details. I concur with their findings with the following additions/corrections:   Reluctantly agreeing to SNF for rehab, given her fall and risk for ongoing falls and on basis of recommendation by PT. Working on bed availability, appreciate CSW assistance.  Lenice Pressman, M.D., Ph.D. 03/02/2019, 7:53 PM

## 2019-03-02 NOTE — Progress Notes (Signed)
Physical Therapy Treatment Patient Details Name: [name] MRN: [id number] DOB: 08-19-1946 Today's Date: 03/02/2019    History of Present Illness  [name] is a 73 y.o. female with history of asthma, HTN, CVA, CKD stage 3, depression, albinism with macular degeneration and CAD who presents after a fal    PT Comments    Pt agreeable to OOB mobility today with verbal encouragement. Pt ambulated 30 ft in room with RW, requiring up to min assist for steadying and safety with RW use. Pt tolerated LE exercises well, to address LE weakness. PT continuing to recommend SNF given pt's deficits and visual impairment, PT to continue to follow acutely.    Follow Up Recommendations  SNF;Supervision/Assistance - 24 hour     Equipment Recommendations  None recommended by PT    Recommendations for Other Services       Precautions / Restrictions Precautions Precautions: Fall Restrictions Weight Bearing Restrictions: No    Mobility  Bed Mobility Overal bed mobility: Needs Assistance Bed Mobility: Supine to Sit Rolling: Mod assist         General bed mobility comments: Mod assist for completion of LEs off EOB, trunk elevation, and scooting to EOB with use of bed pad. Pt with use of bedrails to scoot to EOB.  Transfers Overall transfer level: Needs assistance Equipment used: Rolling walker (2 wheeled) Transfers: Sit to/from Stand Sit to Stand: Min assist;From elevated surface         General transfer comment: sit to stand x2, first attempt pt unable to come to full standing due to bilateral foot pain. After resting, pt tried again and was able to come to full standing with min assist for power up and steadying. PT had to place pt's RUE on the handrest part of RW, pt with tendency to use bar below handrest.  Ambulation/Gait Ambulation/Gait assistance: Min guard;Min assist Gait Distance (Feet): 30 Feet Assistive device: Rolling walker (2 wheeled) Gait  Pattern/deviations: Step-through pattern;Decreased stride length;Trunk flexed Gait velocity: decr   General Gait Details: Mostly min guard for safety, occasional min assist for steadying. Pt with very stiff-legged ambulation, verbal cuing for placement in RW x2. Pt fatigued after 30 ft ambulation and presented with DOE 3/4.   Stairs             Wheelchair Mobility    Modified Rankin (Stroke Patients Only)       Balance Overall balance assessment: Needs assistance Sitting-balance support: Single extremity supported;Feet supported Sitting balance-Leahy Scale: Fair Sitting balance - Comments: Pt with heavy R lateral leaning, resting on R forearm. PT verbally and tactilly cued pt to come to upright sitting, pt using bedrail to pull to upright. Postural control: Right lateral lean(states this is baseline) Standing balance support: Bilateral upper extremity supported;During functional activity Standing balance-Leahy Scale: Poor Standing balance comment: reliant on RW                            Cognition Arousal/Alertness: Awake/alert Behavior During Therapy: WFL for tasks assessed/performed Overall Cognitive Status: No family/caregiver present to determine baseline cognitive functioning                                 General Comments: Pt is pleasant and cooperative with PT      Exercises General Exercises - Lower Extremity Ankle Circles/Pumps: AROM;Both;15 reps;Seated Long Arc Quad: AROM;Both;10 reps;Seated Heel Slides: AROM;Both;15  reps;Seated Straight Leg Raises: AAROM;Both;10 reps;Seated Hip Flexion/Marching: AROM;Both;10 reps;Seated    General Comments General comments (skin integrity, edema, etc.): pt's bed wet upon PT arrival, PT stripped pt's bed when pt up in recliner. RN notified, also pt needed new purewick.      Pertinent Vitals/Pain Pain Assessment: Faces Faces Pain Scale: Hurts little more Pain Location: bilateral feet Pain  Descriptors / Indicators: Sore Pain Intervention(s): Limited activity within patient's tolerance;Repositioned;Monitored during session    Home Living                      Prior Function            PT Goals (current goals can now be found in the care plan section) Acute Rehab PT Goals Patient Stated Goal: to not be in pain PT Goal Formulation: With patient Time For Goal Achievement: 03/14/19 Potential to Achieve Goals: Good Progress towards PT goals: Progressing toward goals    Frequency    Min 3X/week      PT Plan Current plan remains appropriate    Co-evaluation              AM-PAC PT "6 Clicks" Mobility   Outcome Measure  Help needed turning from your back to your side while in a flat bed without using bedrails?: A Lot Help needed moving from lying on your back to sitting on the side of a flat bed without using bedrails?: A Lot Help needed moving to and from a bed to a chair (including a wheelchair)?: A Lot Help needed standing up from a chair using your arms (e.g., wheelchair or bedside chair)?: A Lot Help needed to walk in hospital room?: A Little Help needed climbing 3-5 steps with a railing? : Total 6 Click Score: 12    End of Session Equipment Utilized During Treatment: Gait belt Activity Tolerance: Patient tolerated treatment well Patient left: in chair;with chair alarm set;with call bell/phone within reach Nurse Communication: Mobility status PT Visit Diagnosis: Unsteadiness on feet (R26.81);Other abnormalities of gait and mobility (R26.89);Muscle weakness (generalized) (M62.81);Pain     Time: [id number] PT Time Calculation (min) (ACUTE ONLY): 33 min  Charges:  $Gait Training: 8-22 mins $Therapeutic Exercise: 8-22 mins                     Nicola PoliceAlexa D Morganne Haile, PT Acute Rehabilitation Services Pager [phone]  Office [phone]    Jamilia Jacques D Despina Hiddenure 03/02/2019, 4:58 PM

## 2019-03-02 NOTE — Care Management Important Message (Signed)
Important Message  Patient Details  Name: [name] MRN: [id number] Date of Birth: 1946-07-09   Medicare Important Message Given:  Yes     Khadar Monger 03/02/2019, 1:25 PM

## 2019-03-02 NOTE — Discharge Summary (Addendum)
Name: [name] MRN: [id number] DOB: 1946/08/15 73 y.o. PCP: Miguel AschoffWilliams, Julie Anne, MD  Date of Admission: 02/27/2019 11:56 AM Date of Discharge: 03/02/2019 Attending Physician: Anne Shutteraines,  N, MD  Discharge Diagnosis:  Rhabdomyolysis secondary to a mechanical fall Foot Pain  Discharge Medications: Allergies as of 03/04/2019      Reactions   Lisinopril Swelling   Zolpidem Tartrate    Other reaction(s): Agitation   Eggs Or Egg-derived Products    Upset stomach       Medication List    STOP taking these medications   furosemide 40 MG tablet Commonly known as: LASIX     TAKE these medications   acetaminophen 500 MG tablet Commonly known as: TYLENOL Take 500-1,000 mg by mouth 3 (three) times daily as needed for mild pain.   albuterol 0.63 MG/3ML nebulizer solution Commonly known as: ACCUNEB Take 3 mLs by nebulization 3 (three) times daily as needed for wheezing or shortness of breath.   albuterol 108 (90 Base) MCG/ACT inhaler Commonly known as: VENTOLIN HFA Inhale 2 puffs into the lungs every 6 (six) hours as needed for wheezing or shortness of breath.   amLODipine 10 MG tablet Commonly known as: NORVASC Take 1 tablet (10 mg total) by mouth daily.   aspirin EC 81 MG tablet Take 81 mg by mouth daily.   atorvastatin 10 MG tablet Commonly known as: LIPITOR Take 10 mg by mouth daily.   BIOFREEZE EX Apply 1 application topically 3 (three) times daily as needed (joint pain).   budesonide-formoterol 160-4.5 MCG/ACT inhaler Commonly known as: SYMBICORT Inhale 2 puffs into the lungs 2 (two) times daily.   citalopram 20 MG tablet Commonly known as: CELEXA Take 20 mg by mouth daily.   diphenhydrAMINE 25 MG tablet Commonly known as: BENADRYL Take 1 tablet (25 mg total) by mouth every 6 (six) hours. What changed:   when to take this  reasons to take this   EPINEPHrine 0.3 mg/0.3 mL Soaj injection Commonly known as: EPI-PEN Inject 0.3 mg into the  muscle See admin instructions. Inject once as needed for allergic reaction. May repeat dose x1 after 5-15 minutes if needed.   fexofenadine 60 MG tablet Commonly known as: ALLEGRA Take 60 mg by mouth 2 (two) times daily.   fluticasone 50 MCG/ACT nasal spray Commonly known as: FLONASE Place 1 spray into both nostrils daily.   guaiFENesin 100 MG/5ML Soln Commonly known as: ROBITUSSIN Take 5 mLs by mouth every 4 (four) hours as needed for cough or to loosen phlegm.   Melatonin 5 MG Tabs Take 5 mg by mouth at bedtime as needed (insomnia).   metoprolol tartrate 50 MG tablet Commonly known as: LOPRESSOR Take 75 mg by mouth See admin instructions. Take 1 1/2 tablets (75mg ) twice daily   sennosides-docusate sodium 8.6-50 MG tablet Commonly known as: SENOKOT-S Take 1-2 tablets by mouth 2 (two) times daily.   spironolactone 25 MG tablet Commonly known as: ALDACTONE Take 25 mg by mouth daily.       Disposition and follow-up:   [name].  At the hospital follow up visit please address:  Rhabdomyolysis - Patient experienced fall at home and remained down overnight - Discharge to SNF for rehab, check on progress - Ensure patient wears life-alert around her neck, not just on her walker  Foot Pain  -can continue flexeril on a prn basis and replete electrolytes as needed.  Thyroid nodule: -  Hypodense 2.1 cm right thyroid nodule on CT - Thyroid ultrasound correlation indicated if not previously performed  2.  Labs / imaging needed at time of follow-up: None 3.  Pending labs/ test needing follow-up: None  Follow-up Appointments:   Hospital Course by problem list:  Rhabdomyolysis secondary to a mechanical fall Patient presented after a fall at home. She lives alone, but has an aide that comes to see her and her daughter lives near by. She remained on the ground overnight, until her aide found her in  the morning. Imaging negative for acute bony abnormalities. Labs showed elevated CK >7000. Patient was admitted for IVF for renal protection. CK trended down and was in the 3,000 at last check. PT/OT recommended SNF. After discussion with daughter patient agreeable to SNF placement for acute rehab.  Foot Pain Patient came in with bilateral foot pain during hospitalization that is cramping in nature and present below knees to the bottom of feet. She did not have any bowel/bladder incontinence or signs of ischemia. Her pain improved with cyclobenzaprine, ivf, and magnesium.   Discharge Vitals:   BP 124/70   Pulse 66   Temp 98.7 F (37.1 C) (Oral)   Resp (!) 23   Wt 117 kg   SpO2 97%   BMI 45.70 kg/m   Pertinent Labs, Studies, and Procedures:  BMP Latest Ref Rng & Units 03/02/2019 03/01/2019 02/28/2019  Glucose 70 - 99 mg/dL 161(W108(H) 960(A104(H) 540(J104(H)  BUN 8 - 23 mg/dL 8 8 11   Creatinine 0.44 - 1.00 mg/dL 8.110.81 9.140.87 7.820.97  Sodium 135 - 145 mmol/L 141 142 140  Potassium 3.5 - 5.1 mmol/L 4.1 3.7 3.8  Chloride 98 - 111 mmol/L 109 112(H) 107  CO2 22 - 32 mmol/L 25 23 25   Calcium 8.9 - 10.3 mg/dL 9.5(A8.7(L) 2.1(H8.2(L) 0.8(M8.5(L)   CBC Latest Ref Rng & Units 02/27/2019 02/27/2019 02/20/2019  WBC 4.0 - 10.5 K/uL 6.5 6.1 4.9  Hemoglobin 12.0 - 15.0 g/dL 10.0(L) 11.7(L) 10.5(L)  Hematocrit 36.0 - 46.0 % 31.9(L) 38.7 33.9(L)  Platelets 150 - 400 K/uL 192 228 190   CK: 7,587 >> 6,342 >> 5,725 >> 3,134   EKG Interpretation  Date/Time:  Sunday February 27 2019 12:58:49 EDT Ventricular Rate:  55 PR Interval:    QRS Duration: 213 QT Interval:  473 QTC Calculation: 453 R Axis:   97 Text Interpretation:  Sinus rhythm Nonspecific intraventricular conduction delay Artifact in lead(s) I II III aVR aVL aVF V1 V3 V4 When copmpared to prior, more artifact.  No STEMI Confirmed by Theda Belfastegeler, Chris ([id number]) on 02/27/2019 1:10:24 PM      Chest Xray: IMPRESSION: No acute cardiopulmonary disease. Mild cardiomegaly, without acute disease.  Mild limitations as detailed above.  DG Tib/Fib Right: IMPRESSION: No acute fracture or malalignment. Atherosclerotic vascular calcifications.  DG Knee Right: IMPRESSION: 1. No acute fracture or malalignment. 2. Tricompartmental degenerative osteoarthritis. 3. Atherosclerotic vascular calcifications.  DG Hips Bilat: IMPRESSION: 1. No acute fracture or malalignment. 2. Bilateral hip joint osteoarthritis. 3. Lower lumbar degenerative disc disease and L5-S1 facet arthropathy worse on the right. 4. Extensive atherosclerotic vascular calcifications.  DG Shoulder Left: IMPRESSION: Negative.  DG shoulder Right: IMPRESSION: Negative.  CT Head and Cervical Spine: IMPRESSION: 1. No evidence of acute intracranial abnormality. No evidence of calvarial fracture. 2. Generalized cerebral volume loss and prominent chronic small vessel ischemic changes in the cerebral white matter. 3. No cervical spine fracture or subluxation. 4. Moderate to marked degenerative changes throughout the cervical spine  as detailed. 5. Hypodense 2.1 cm right thyroid nodule, for which thyroid ultrasound correlation is indicated if not previously performed. This follows ACR consensus guidelines: Managing Incidental Thyroid Nodules Detected on Imaging: White Paper of the ACR Incidental Thyroid Findings Committee. J Am Coll Radiol 2015; 12:143-150.  Discharge Instructions: Discharge Instructions    Call MD for:  difficulty breathing, headache or visual disturbances   Complete by: As directed    Call MD for:  extreme fatigue   Complete by: As directed    Call MD for:  persistant dizziness or light-headedness   Complete by: As directed    Call MD for:  persistant nausea and vomiting   Complete by: As directed    Call MD for:  temperature >100.4   Complete by: As directed    Diet - low sodium heart healthy   Complete by: As directed    Discharge instructions   Complete by: As directed    Thank you for  allowing [place] to care for you  Your symptoms were due to your recent fall - You were treated with fluids to prevent toxins from damaging your kidneys  You will need rehab to improve your strength and help prevent more falls - Your will be discharged to a skilled nursing facility to help with this  Please follow up with you PCP in the next 1-2 weeks   Increase activity slowly   Complete by: As directed       Signed: Tamsen Snider, MD PGY1  [phone]     Internal Medicine Attending Note:  I saw and examined the patient on the day of discharge. I reviewed and agree with the discharge summary written by the house staff.  Lenice Pressman, M.D., Ph.D.

## 2019-03-02 NOTE — TOC Progression Note (Signed)
Transition of Care [facility]) - Progression Note    Patient Details  Name: [name] MRN: [id number] Date of Birth: 20-Jul-1946  [facility]) CM/SW Contact  Sharlet Salina Mila Homer, LCSW Phone Number: 03/02/2019, 5:49 PM  Clinical Narrative: CSW talked with Valda Favia, admissions staff at [facility] regarding patient and they are unable to accept as she is a high fall risk and would initially be place in isolation behind closed and would not have the supervision she would need to be safe. Doroteo Bradford, [place] contacted and informed. Discussed that [facility] and [facility] (accepts PACE participants) are not taking patient at this time. Doroteo Bradford talked with patient's daughter and was given permission to look for other facilities. CSW visited with patient and she gave permission for a facility search to continue.  Because patient is a PACE participant, Doroteo Bradford has to find a facility that is not associated with PACE that is willing to accept a PACE participate. CSW will maintain contact with [place] regarding the facility search.    Expected Discharge Plan: Skilled Nursing Facility Barriers to Discharge: No SNF bed  Expected Discharge Plan and Services Expected Discharge Plan: [place] In-house Referral: Clinical Social Work Discharge Planning Services: NA Post Acute Care Choice: [place] Living arrangements for the past 2 months: Apartment                           HH Arranged: NA           Social Determinants of Health (SDOH) Interventions    Readmission Risk Interventions No flowsheet data found.

## 2019-03-03 ENCOUNTER — Encounter (HOSPITAL_COMMUNITY): Payer: Self-pay | Admitting: General Practice

## 2019-03-03 NOTE — Progress Notes (Signed)
   Subjective: Pt seen on AM rounds today. Resting comfortably in bed. Slept well last night. Feet are sore. Pt reassured that we are trying to find SNF placement for her.Otherwise no complaints this AM.  Objective:  Vital signs in last 24 hours: Vitals:   03/02/19 2029 03/02/19 2142 03/03/19 0535 03/03/19 0833  BP:  (!) 131/57 (!) 148/56   Pulse:  74 72   Resp:  18 18   Temp:  98.2 F (36.8 C) 98.6 F (37 C)   TempSrc:  Oral    SpO2: 98% 95% 94% 95%  Weight:       .Physical Exam Constitutional:      Appearance: She is obese.  Cardiovascular:     Rate and Rhythm: Normal rate and regular rhythm.  Pulmonary:     Effort: Pulmonary effort is normal.     Breath sounds: Normal breath sounds.     Comments: Auscultate anteriorly  Abdominal:     General: Bowel sounds are normal.     Palpations: Abdomen is soft.  Skin:    General: Skin is warm and dry.  Neurological:     Mental Status: She is alert.    Assessment/Plan:  Principal Problem:   Rhabdomyolysis Active Problems:   Fall   Bilateral foot pain  [name] is a 73 y.o. female with history of asthma, HTN, CVA, CKD stage 3, depression, albinism with macular degeneration and CAD who presents after a fall.  Patient fell and found down the next morning by health aide. AST , ALT, and elevated CK 7,172. Imaging in the ED including CT cervical spine, CT head wo contrast, xray L. And R. Shoulder, xray Tibia/Fibula , and xray knee all without acute injury.   Rhabdomyolysis Patient fell and found down the nextmorning by health aide.CK 7172 >3134 7/7. Patient feels well this morning. Ready for discharge to SNF. Patient initially wanted to go home against recommendations from PT/OT/MD to go to SNF. After speaking to her daughter she is going to go to SNF.  -SW is currently working on placement pending availability. See SW note for details.   Foot pain Bilaterally pain below kneeand going through the bottom of her  feet. The pain seems to intensifies in rhythmic patterns and likely cramps with acute onset. Lower extremities are warm and dry. Pain has gotten much better after IV fluids , one dose of flexeril, and magnesium.   -resolved  CAD - continue home statin, Asprin , metoprolol tartrate   Asthma Patient %sat 98 on RA.  - schedule duoneb  Diet: Heart Healthy  DVT: Lovenox  Code: DNI   Dispo: Discharge pending on SNF placement. Pt is medically stable.   Anita Snider, MD PGY1  [phone]

## 2019-03-03 NOTE — Progress Notes (Signed)
  Date: 03/03/2019  Patient name: [name]  Medical record number: [id number]  Date of birth: 1945/10/14   I have seen and evaluated this patient and I have discussed the plan of care with the house staff. Please see their note for complete details. I concur with their findings with the following additions/corrections:   Medically stable for discharge to SNF when bed available. Rhabdomyolysis has resolved with IV fluids. Foot pain has improved. Still high fall risk, will need rehab and close supervision.   Lenice Pressman, M.D., Ph.D. 03/03/2019, 2:18 PM

## 2019-03-04 DIAGNOSIS — Z888 Allergy status to other drugs, medicaments and biological substances status: Secondary | ICD-10-CM

## 2019-03-04 DIAGNOSIS — Z91012 Allergy to eggs: Secondary | ICD-10-CM

## 2019-03-04 LAB — SARS CORONAVIRUS 2 BY RT PCR (HOSPITAL ORDER, PERFORMED IN ~~LOC~~ HOSPITAL LAB): SARS Coronavirus 2: NEGATIVE

## 2019-03-04 NOTE — Progress Notes (Signed)
   Subjective: [name] was seen on AM rounds. Resting comfortably in bed. She is upset that she hasn't been able to leave the hospital yet. She was reassured we are trying to find a place to her to go. No pain this AM. She says her legs feel much better today.   Objective:  Vital signs in last 24 hours: Vitals:   03/03/19 2138 03/04/19 0616 03/04/19 0748 03/04/19 0944  BP: (!) 158/74 108/86  (!) 133/55  Pulse: 73 66  71  Resp: 16 18    Temp: 98.5 F (36.9 C) 98.6 F (37 C)  98.7 F (37.1 C)  TempSrc: Oral   Oral  SpO2: 97% 97% 96% 97%  Weight:       Physical Exam Constitutional:      Appearance: She is obese.  Cardiovascular:     Rate and Rhythm: Normal rate and regular rhythm.  Abdominal:     General: Bowel sounds are normal.     Palpations: Abdomen is soft.  Skin:    General: Skin is warm and dry.  Neurological:     Mental Status: She is alert. Mental status is at baseline.     Assessment/Plan:  Principal Problem:   Rhabdomyolysis Active Problems:   Fall   Bilateral foot pain  [name] is a 73 y.o. female with history of asthma, HTN, CVA, CKD stage 3, depression, albinism with macular degeneration and CAD who presents after a fall.  Patient fell and found down the next morning by health aide. AST , ALT, and elevated CK 7,172. Imaging in the ED including CT cervical spine, CT head wo contrast, xray L. And R. Shoulder, xray Tibia/Fibula , and xray knee all without acute injury.   Rhabdomyolysis Patient fell and found down the nextmorning by health aide.CK 7172 >31347/7. Patient feels well this morning. Ready for dischargeto SNF. Patient initially wanted to go home against recommendations from PT/OT/MD to go to SNF. After speaking to her daughter she is going to go to SNF. -SW is currently working on placement pending availability.See SW note for details.    Foot pain Bilaterally pain below kneeand going through the bottom of her feet. The  pain seems to intensifies in rhythmic patterns and likely cramps with acute onset. Lower extremities are warm and dry. Painhas gotten much better after IV fluids , one dose of flexeril, and magnesium. -c/w pt/ot  CAD - continue home statin, Asprin , metoprolol tartrate   Asthma Patient %sat 98 on RA.  - schedule duoneb  Diet: Heart Healthy  DVT: Lovenox  Code: DNI   Dispo: Discharge pending on SNF placement. Pt is medically stable.    Tamsen Snider, MD PGY1  [phone]

## 2019-03-04 NOTE — TOC Transition Note (Addendum)
Transition of Care [facility]) - CM/SW Discharge Note 03/04/19 - Discharged to [facility]   Patient Details  Name: [name] MRN: [id number] Date of Birth: 05/28/1946  Transition of Care [facility]) CM/SW Contact:  Sable Feil, LCSW Phone Number: 03/04/2019, 6:17 PM   Clinical Narrative: PACE able to accept patient today, after initially declining patient. MD advised and discharge paperwork completed. PACE SW Ada arranged transportation via [employer]. MD and Doroteo Bradford contacted daughter regarding discharge.  COVID results (negative) sent with patient in discharge packet.    Final next level of care: [place] and Rehab Barriers to Discharge: No Barriers Identified   Patient Goals and CMS Choice Patient states their goals for this hospitalization and ongoing recovery are:: Return home CMS Medicare.gov Compare Post Acute Care list provided to:: Other (Comment Required)(PACE participant and only 3 facilities in [facility] that accepts PACE participants) Choice offered to / list presented to : NA(PACE participant)  Discharge Placement   Existing PASRR number confirmed : 03/01/19          Patient chooses bed at: [place] Patient to be transferred to facility by: PACE transportation Name of family member notified: Daughter [name] - contacted by MD and Doroteo Bradford, [place] Patient and family notified of of transfer: 03/04/19  Discharge Plan and Services In-house Referral: Clinical Social Work Discharge Planning Services: NA Post Acute Care Choice: Skilled Nursing Facility                    HH Arranged: NA          Social Determinants of Health (SDOH) Interventions  No SDOH interventions needed   Readmission Risk Interventions No flowsheet data found.

## 2019-03-04 NOTE — Progress Notes (Signed)
Occupational Therapy Treatment Patient Details Name: [name] MRN: [id number] DOB: 02-Nov-1945 Today's Date: 03/04/2019    History of present illness  [name] is a 73 y.o. female with history of asthma, HTN, CVA, CKD stage 3, depression, albinism with macular degeneration and CAD who presents after a fal   OT comments  Pt. Able to complete bed level grooming task with min a. Declined oob this session.    Follow Up Recommendations  SNF;Supervision/Assistance - 24 hour    Equipment Recommendations  3 in 1 bedside commode;Hospital bed    Recommendations for Other Services      Precautions / Restrictions Precautions Precautions: Fall Restrictions Weight Bearing Restrictions: No       Mobility Bed Mobility Overal bed mobility: Needs Assistance             General bed mobility comments: able to assist with scooting to hob with inst. and tactile guidance for b ues on bed rails. able to push with b les and pull towards hob min a  Transfers                      Balance                                           ADL either performed or assessed with clinical judgement   ADL Overall ADL's : Needs assistance/impaired     Grooming: Oral care;Minimal assistance;Bed level Grooming Details (indicate cue type and reason): pt. able to complete task without physical assistance but did require help handing her items secondary to visual deficits                               General ADL Comments: pt. declined oob but agreeable to grooming tasks bed level.     Vision       Perception     Praxis      Cognition   Behavior During Therapy: WFL for tasks assessed/performed Overall Cognitive Status: No family/caregiver present to determine baseline cognitive functioning                                          Exercises     Shoulder Instructions       General Comments      Pertinent Vitals/  Pain       Pain Assessment: No/denies pain  Home Living                                          Prior Functioning/Environment              Frequency  Min 2X/week        Progress Toward Goals  OT Goals(current goals can now be found in the care plan section)  Progress towards OT goals: Progressing toward goals     Plan Discharge plan remains appropriate    Co-evaluation                 AM-PAC OT "6 Clicks" Daily Activity     Outcome Measure   Help from another person eating meals?: A  Little Help from another person taking care of personal grooming?: A Little Help from another person toileting, which includes using toliet, bedpan, or urinal?: Total Help from another person bathing (including washing, rinsing, drying)?: A Lot Help from another person to put on and taking off regular upper body clothing?: A Lot Help from another person to put on and taking off regular lower body clothing?: Total 6 Click Score: 12    End of Session    OT Visit Diagnosis: Unsteadiness on feet (R26.81);Other abnormalities of gait and mobility (R26.89);Repeated falls (R29.6);Muscle weakness (generalized) (M62.81);History of falling (Z91.81);Low vision, both eyes (H54.2);Pain   Activity Tolerance Patient tolerated treatment well   Patient Left in bed;with call bell/phone within reach;with bed alarm set   Nurse Communication (spoke with rn as pt. states she has hh set up with [facility] and does not want to go snf but noted md note states snf, rn states she will follow up)        Time: 1840-3754 OT Time Calculation (min): 20 min  Charges: OT General Charges $OT Visit: 1 Visit OT Treatments $Self Care/Home Management : 8-22 mins   Janice Coffin, COTA/L 03/04/2019, 11:50 AM

## 2019-03-04 NOTE — Progress Notes (Signed)
  Date: 03/04/2019  Patient name: [name]  Medical record number: [id number]  Date of birth: 10/03/1945   I have seen and evaluated this patient and I have discussed the plan of care with the house staff. Please see their note for complete details. I concur with their findings with the following additions/corrections:   Continues to improve, awaiting bed availability for discharge to SNF.  Lenice Pressman, M.D., Ph.D. 03/04/2019, 2:04 PM

## 2019-03-04 NOTE — Progress Notes (Signed)
1539: Call to daughter [name]. Updated on patient condition.

## 2019-03-06 LAB — NOVEL CORONAVIRUS, NAA: SARS-CoV-2, NAA: NEGATIVE

## 2019-04-26 ENCOUNTER — Other Ambulatory Visit: Payer: Self-pay | Admitting: Internal Medicine

## 2019-04-26 DIAGNOSIS — Z1231 Encounter for screening mammogram for malignant neoplasm of breast: Secondary | ICD-10-CM

## 2019-06-05 ENCOUNTER — Emergency Department (HOSPITAL_COMMUNITY)
Admission: EM | Admit: 2019-06-05 | Discharge: 2019-06-05 | Disposition: A | Discharge status: Home / Self Care | Payer: Medicare (Managed Care) | Attending: Emergency Medicine | Admitting: Emergency Medicine

## 2019-06-05 ENCOUNTER — Emergency Department (HOSPITAL_COMMUNITY): Payer: Medicare (Managed Care)

## 2019-06-05 ENCOUNTER — Encounter (HOSPITAL_COMMUNITY): Payer: Self-pay | Admitting: Emergency Medicine

## 2019-06-05 ENCOUNTER — Other Ambulatory Visit: Payer: Self-pay

## 2019-06-05 DIAGNOSIS — Z8673 Personal history of transient ischemic attack (TIA), and cerebral infarction without residual deficits: Secondary | ICD-10-CM | POA: Diagnosis not present

## 2019-06-05 DIAGNOSIS — Z79899 Other long term (current) drug therapy: Secondary | ICD-10-CM | POA: Diagnosis not present

## 2019-06-05 DIAGNOSIS — R404 Transient alteration of awareness: Secondary | ICD-10-CM | POA: Insufficient documentation

## 2019-06-05 DIAGNOSIS — I10 Essential (primary) hypertension: Secondary | ICD-10-CM | POA: Diagnosis not present

## 2019-06-05 DIAGNOSIS — J45909 Unspecified asthma, uncomplicated: Secondary | ICD-10-CM | POA: Diagnosis not present

## 2019-06-05 DIAGNOSIS — R2243 Localized swelling, mass and lump, lower limb, bilateral: Secondary | ICD-10-CM | POA: Diagnosis not present

## 2019-06-05 DIAGNOSIS — Z7982 Long term (current) use of aspirin: Secondary | ICD-10-CM | POA: Insufficient documentation

## 2019-06-05 DIAGNOSIS — I2510 Atherosclerotic heart disease of native coronary artery without angina pectoris: Secondary | ICD-10-CM | POA: Diagnosis not present

## 2019-06-05 DIAGNOSIS — R109 Unspecified abdominal pain: Secondary | ICD-10-CM | POA: Insufficient documentation

## 2019-06-05 DIAGNOSIS — R4182 Altered mental status, unspecified: Secondary | ICD-10-CM | POA: Diagnosis present

## 2019-06-05 LAB — RAPID URINE DRUG SCREEN, HOSP PERFORMED
Amphetamines: NOT DETECTED
Barbiturates: NOT DETECTED
Benzodiazepines: NOT DETECTED
Cocaine: NOT DETECTED
Opiates: NOT DETECTED
Tetrahydrocannabinol: NOT DETECTED

## 2019-06-05 LAB — CBC WITH DIFFERENTIAL/PLATELET
Abs Immature Granulocytes: 0.06 10*3/uL (ref 0.00–0.07)
Basophils Absolute: 0.1 10*3/uL (ref 0.0–0.1)
Basophils Relative: 1 %
Eosinophils Absolute: 0.5 10*3/uL (ref 0.0–0.5)
Eosinophils Relative: 6 %
HCT: 42.1 % (ref 36.0–46.0)
Hemoglobin: 12.8 g/dL (ref 12.0–15.0)
Immature Granulocytes: 1 %
Lymphocytes Relative: 25 %
Lymphs Abs: 2.1 10*3/uL (ref 0.7–4.0)
MCH: 26.8 pg (ref 26.0–34.0)
MCHC: 30.4 g/dL (ref 30.0–36.0)
MCV: 88.1 fL (ref 80.0–100.0)
Monocytes Absolute: 0.5 10*3/uL (ref 0.1–1.0)
Monocytes Relative: 6 %
Neutro Abs: 5.2 10*3/uL (ref 1.7–7.7)
Neutrophils Relative %: 61 %
Platelets: 236 10*3/uL (ref 150–400)
RBC: 4.78 MIL/uL (ref 3.87–5.11)
RDW: 15.6 % — ABNORMAL HIGH (ref 11.5–15.5)
WBC: 8.4 10*3/uL (ref 4.0–10.5)
nRBC: 0 % (ref 0.0–0.2)

## 2019-06-05 LAB — COMPREHENSIVE METABOLIC PANEL
ALT: 32 U/L (ref 0–44)
AST: 24 U/L (ref 15–41)
Albumin: 4.2 g/dL (ref 3.5–5.0)
Alkaline Phosphatase: 85 U/L (ref 38–126)
Anion gap: 14 (ref 5–15)
BUN: 41 mg/dL — ABNORMAL HIGH (ref 8–23)
CO2: 28 mmol/L (ref 22–32)
Calcium: 9.8 mg/dL (ref 8.9–10.3)
Chloride: 94 mmol/L — ABNORMAL LOW (ref 98–111)
Creatinine, Ser: 1.69 mg/dL — ABNORMAL HIGH (ref 0.44–1.00)
GFR calc Af Amer: 35 mL/min — ABNORMAL LOW (ref >60)
GFR calc non Af Amer: 30 mL/min — ABNORMAL LOW (ref >60)
Glucose, Bld: 137 mg/dL — ABNORMAL HIGH (ref 70–99)
Potassium: 3.6 mmol/L (ref 3.5–5.1)
Sodium: 136 mmol/L (ref 135–145)
Total Bilirubin: 0.8 mg/dL (ref 0.3–1.2)
Total Protein: 7.5 g/dL (ref 6.5–8.1)

## 2019-06-05 LAB — POCT I-STAT EG7
Acid-Base Excess: 8 mmol/L — ABNORMAL HIGH (ref 0.0–2.0)
Bicarbonate: 34.1 mmol/L — ABNORMAL HIGH (ref 20.0–28.0)
Calcium, Ion: 1.19 mmol/L (ref 1.15–1.40)
HCT: 39 % (ref 36.0–46.0)
Hemoglobin: 13.3 g/dL (ref 12.0–15.0)
O2 Saturation: 68 %
Potassium: 3.9 mmol/L (ref 3.5–5.1)
Sodium: 137 mmol/L (ref 135–145)
TCO2: 36 mmol/L — ABNORMAL HIGH (ref 22–32)
pCO2, Ven: 52.8 mmHg (ref 44.0–60.0)
pH, Ven: 7.418 (ref 7.250–7.430)
pO2, Ven: 36 mmHg (ref 32.0–45.0)

## 2019-06-05 LAB — URINALYSIS, ROUTINE W REFLEX MICROSCOPIC
Bilirubin Urine: NEGATIVE
Glucose, UA: NEGATIVE mg/dL
Hgb urine dipstick: NEGATIVE
Ketones, ur: NEGATIVE mg/dL
Leukocytes,Ua: NEGATIVE
Nitrite: NEGATIVE
Protein, ur: NEGATIVE mg/dL
Specific Gravity, Urine: 1.008 (ref 1.005–1.030)
pH: 6 (ref 5.0–8.0)

## 2019-06-05 LAB — ETHANOL: Alcohol, Ethyl (B): <10 mg/dL (ref <10)

## 2019-06-05 LAB — CBG MONITORING, ED: Glucose-Capillary: 146 mg/dL — ABNORMAL HIGH (ref 70–99)

## 2019-06-05 LAB — BRAIN NATRIURETIC PEPTIDE: B Natriuretic Peptide: 26.2 pg/mL (ref 0.0–100.0)

## 2019-06-05 LAB — CK: Total CK: 131 U/L (ref 38–234)

## 2019-06-05 LAB — LACTIC ACID, PLASMA: Lactic Acid, Venous: 1.9 mmol/L (ref 0.5–1.9)

## 2019-06-05 LAB — AMMONIA: Ammonia: 15 umol/L (ref 9–35)

## 2019-06-05 NOTE — ED Notes (Signed)
Pt given dc instructions pt verbalizes understanding.  

## 2019-06-05 NOTE — Discharge Instructions (Signed)
I did not find an obvious cause for your symptoms.  Has you are feeling better and requesting to go home then I will discharge you.  Please call your family doctor tomorrow and discuss your visit here and see when they want to see back in the office.  Please return to the emergency department at any time you wish to be reevaluated or if you feel like your symptoms worsen

## 2019-06-05 NOTE — ED Provider Notes (Signed)
[facility] EMERGENCY DEPARTMENT Provider Note   CSN: [id number] Arrival date & time: 06/05/19  4098     History   Chief Complaint Chief Complaint  Patient presents with   Altered Mental Status    HPI [name] is a 73 y.o. female.     73 yo F who [facility] comes in with a chief complaint of a change in her mental status.  They went to check on her this morning and realized that she was not acting her her normal.  Was complaining of some right side pain and diffuse arm and leg pain.  This is been occurring off and on.  Patient is a difficult historian.  States that she has been feeling bad for a few days and was seen by her doctor on Friday.  Unable to describe her exact symptoms.  States that her right side hurts currently.  States she is also been falling quite a bit.  Unable to describe the falls are when they happened.  The history is provided by the patient.  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   ASTHMA 04/16/2007   CEREBROVASCULAR ACCIDENT, HX OF 04/16/2007   CORONARY ARTERY DISEASE 08/29/2008   Environmental allergies    Hyperlipidemia    HYPERTENSION 04/16/2007   Osteoarthritis    Stroke (HCC)    URI 10/25/2009    Patient Active Problem List   Diagnosis Date Noted   Fall 02/28/2019   Bilateral foot pain 02/28/2019   Rhabdomyolysis 02/27/2019   Impaired glucose tolerance 12/14/2013    Obesity 12/14/2013   URI 10/25/2009   Coronary atherosclerosis 08/29/2008   Essential hypertension 04/16/2007   ASTHMA 04/16/2007   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      Home Medications    Prior to Admission medications   Medication Sig Start Date End Date Taking? Authorizing Provider  acetaminophen (TYLENOL) 500 MG tablet Take 500-1,000 mg by mouth 3 (three) times daily as needed for mild pain.     [provider]  albuterol (ACCUNEB) 0.63 MG/3ML nebulizer solution Take 3 mLs by nebulization 3 (three) times daily as needed for wheezing or shortness of breath.    [provider]  albuterol (PROVENTIL HFA;VENTOLIN HFA) 108 (90 BASE) MCG/ACT inhaler Inhale 2 puffs into the lungs every 6 (six) hours as needed for wheezing or shortness of breath. 03/20/15   Gordy Savers, MD  amLODipine (NORVASC) 10 MG tablet Take 1 tablet (10 mg total) by mouth daily. 03/01/15   Gordy Savers, MD  aspirin EC 81 MG tablet Take 81 mg by mouth daily.    [provider]  atorvastatin (LIPITOR) 10 MG tablet Take 10 mg by mouth daily.    [provider]  budesonide-formoterol (SYMBICORT) 160-4.5 MCG/ACT inhaler Inhale 2 puffs into the lungs 2 (two) times daily.     [provider]  citalopram (CELEXA) 20 MG tablet Take 20 mg by mouth daily.    [provider]  diphenhydrAMINE (BENADRYL) 25 MG tablet Take 1 tablet (25 mg total) by mouth every 6 (six) hours. Patient taking differently: Take 25 mg by mouth 3 (three) times daily as needed (lip swelling).  02/08/18   Varney Biles, MD  EPINEPHrine 0.3 mg/0.3 mL IJ SOAJ injection Inject 0.3 mg into the muscle See admin instructions. Inject once as needed for allergic reaction. May repeat dose x1 after 5-15  minutes if needed.    [provider]  fexofenadine (ALLEGRA) 60 MG tablet Take 60 mg by mouth 2 (two) times daily.    [provider]  fluconazole (DIFLUCAN) 100 MG tablet Take 100 mg by mouth daily. 05/27/19   [provider]  fluticasone (FLONASE) 50 MCG/ACT nasal spray Place 1 spray into both nostrils daily.    [provider]  guaiFENesin (ROBITUSSIN) 100 MG/5ML SOLN Take 5 mLs by mouth every 4 (four) hours as needed for cough or to loosen phlegm.    [provider]  Melatonin 5 MG TABS Take 5 mg by mouth at bedtime as needed (insomnia).     [provider]  Menthol, Topical Analgesic, (BIOFREEZE EX) Apply 1 application topically 3 (three) times daily as needed (joint pain).    [provider]  metoprolol tartrate (LOPRESSOR) 50 MG tablet Take 75 mg by mouth See admin instructions. Take 1 1/2 tablets (75mg ) twice daily    [provider]  sennosides-docusate sodium (SENOKOT-S) 8.6-50 MG tablet Take 1-2 tablets by mouth 2 (two) times daily.    [provider]  spironolactone (ALDACTONE) 25 MG tablet Take 25 mg by mouth daily.     [provider]  losartan-hydrochlorothiazide (HYZAAR) 100-25 MG per tablet Take 1 tablet by mouth daily. 11/03/11 11/14/11  Kennyth Arnold, FNP    Family History Family History  Problem Relation Age of Onset   Asthma Mother    Asthma Sister     Social History Social History   Tobacco Use   Smoking status: Never Smoker   Smokeless tobacco: Never Used  Substance Use Topics   Alcohol use: No   Drug use: No     Allergies   Lisinopril, Zolpidem tartrate, and Eggs or egg-derived products   Review of Systems Review of Systems  Constitutional: Positive for activity change. Negative for chills and fever.  HENT: Negative for congestion and rhinorrhea.   Eyes: Negative for redness and visual disturbance.  Respiratory: Negative for shortness of breath and wheezing.    Cardiovascular: Negative for chest pain and palpitations.  Gastrointestinal: Positive for abdominal pain. Negative for nausea and vomiting.  Genitourinary: Negative for dysuria and urgency.  Musculoskeletal: Positive for arthralgias and myalgias.  Skin: Negative for pallor and wound.  Neurological: Negative for dizziness and headaches.     Physical Exam Updated Vital Signs BP (!) 147/63    Pulse (!) 58    Temp 98.2 F (36.8 C) (Rectal)    Resp 17    SpO2 96%   Physical Exam Vitals signs and nursing note reviewed.  Constitutional:      General: She is not in acute distress.    Appearance: She is well-developed. She is not diaphoretic.  HENT:     Head: Normocephalic and atraumatic.  Eyes:  Pupils: Pupils are equal, round, and reactive to light.  Neck:     Musculoskeletal: Normal range of motion and neck supple.  Cardiovascular:     Rate and Rhythm: Normal rate and regular rhythm.     Heart sounds: No murmur. No friction rub. No gallop.   Pulmonary:     Effort: Pulmonary effort is normal.     Breath sounds: No wheezing or rales.  Abdominal:     General: There is no distension.     Palpations: Abdomen is soft.     Tenderness: There is no abdominal tenderness.  Musculoskeletal:        General: No tenderness.     Right lower leg: Edema present.     Left lower leg: Edema present.     Comments: 3+ edema to bilateral lower extremities up to the thighs  Skin:    General: Skin is warm and dry.  Neurological:     Mental Status: She is alert and oriented to person, place, and time.  Psychiatric:        Behavior: Behavior normal.      ED Treatments / Results  Labs (all labs ordered are listed, but only abnormal results are displayed) Labs Reviewed  COMPREHENSIVE METABOLIC PANEL - Abnormal; Notable for the following components:      Result Value   Chloride 94 (*)    Glucose, Bld 137 (*)    BUN 41 (*)    Creatinine, Ser 1.69 (*)    GFR calc non Af Amer 30 (*)    GFR  calc Af Amer 35 (*)    All other components within normal limits  CBC WITH DIFFERENTIAL/PLATELET - Abnormal; Notable for the following components:   RDW 15.6 (*)    All other components within normal limits  CBG MONITORING, ED - Abnormal; Notable for the following components:   Glucose-Capillary 146 (*)    All other components within normal limits  POCT I-STAT EG7 - Abnormal; Notable for the following components:   Bicarbonate 34.1 (*)    TCO2 36 (*)    Acid-Base Excess 8.0 (*)    All other components within normal limits  AMMONIA  ETHANOL  URINALYSIS, ROUTINE W REFLEX MICROSCOPIC  LACTIC ACID, PLASMA  RAPID URINE DRUG SCREEN, HOSP PERFORMED  CK  BRAIN NATRIURETIC PEPTIDE  CBG MONITORING, ED    EKG EKG Interpretation  Date/Time:  Sunday June 05 2019 09:03:22 EDT Ventricular Rate:  61 PR Interval:    QRS Duration: 103 QT Interval:  411 QTC Calculation: 414 R Axis:   26 Text Interpretation:  Sinus rhythm No significant change since last tracing Confirmed by Melene Plan [id number]) on 06/05/2019 9:14:48 AM   Radiology Dg Chest 1 View  Result Date: 06/05/2019 CLINICAL DATA:  Altered mental status. EXAM: CHEST  1 VIEW COMPARISON:  February 27, 2019. FINDINGS: Stable cardiomegaly. No pneumothorax is noted. Mild bibasilar opacities are noted concerning for atelectasis or infiltrates with associated pleural effusions. Bony thorax unremarkable. IMPRESSION: Mild bibasilar atelectasis or infiltrates are noted with associated pleural effusions. Electronically Signed   By: Lupita Raider M.D.   On: 06/05/2019 09:56   Ct Head Wo Contrast  Result Date: 06/05/2019 CLINICAL DATA:  Altered level of consciousness. EXAM: CT HEAD WITHOUT CONTRAST TECHNIQUE: Contiguous axial images were obtained from the base of the skull through the vertex without intravenous contrast. COMPARISON:  February 17, 2014. FINDINGS: Brain: Mild chronic ischemic white matter disease is noted. No mass effect or midline shift  is noted. Ventricular size is within normal limits. There is no evidence of mass lesion, hemorrhage or acute infarction. Vascular: No hyperdense vessel or unexpected calcification. Skull: Normal. Negative for fracture or focal lesion. Sinuses/Orbits: No acute finding. Other: None. IMPRESSION: Mild chronic ischemic white matter disease. No acute intracranial abnormality seen. Electronically Signed   By: Lupita Raider M.D.   On: 06/05/2019 10:20   Ct Renal Stone Study  Result Date: 06/05/2019 CLINICAL DATA:  Acute right flank pain. EXAM: CT ABDOMEN AND PELVIS WITHOUT CONTRAST TECHNIQUE: Multidetector CT imaging of the abdomen and pelvis was performed following the standard protocol without IV contrast. COMPARISON:  None. FINDINGS: Lower chest: No acute abnormality. Hepatobiliary: No focal liver abnormality is seen. No gallstones, gallbladder wall thickening, or biliary dilatation. Pancreas: Unremarkable. No pancreatic ductal dilatation or surrounding inflammatory changes. Spleen: Normal in size without focal abnormality. Adrenals/Urinary Tract: Adrenal glands are unremarkable. Kidneys are normal, without renal calculi, focal lesion, or hydronephrosis. Bladder is unremarkable. Stomach/Bowel: Stomach is within normal limits. Appendix appears normal. No evidence of bowel wall thickening, distention, or inflammatory changes. Vascular/Lymphatic: Aortic atherosclerosis. No enlarged abdominal or pelvic lymph nodes. Reproductive: Status post hysterectomy. No adnexal masses. Other: Mild bilateral fat containing inguinal hernias are noted. No ascites is noted. Musculoskeletal: No acute or significant osseous findings. IMPRESSION: Mild bilateral fat containing inguinal hernias. No hydronephrosis or renal obstruction is noted. No renal or ureteral calculi are noted. Aortic Atherosclerosis (ICD10-I70.0). Electronically Signed   By: Lupita Raider M.D.   On: 06/05/2019 10:25    Procedures Procedures (including critical  care time)  Medications Ordered in ED Medications - No data to display   Initial Impression / Assessment and Plan / ED Course  I have reviewed the triage vital signs and the nursing notes.  Pertinent labs & imaging results that were available during my care of the patient were reviewed by me and considered in my medical decision making (see chart for details).        73 yo F with a chief complaint of a change in mental status per her caregiver.  Patient has someone come by every day to check on her and they felt that she was worse today than she had been for the past few days.  Patient is a difficult historian and is complaining of diffuse myalgias and right-sided abdominal pain.  Will obtain an altered mental status work-up with a CT scan of the abdomen pelvis.  She is noted to have some significant lower extremity edema.  Was recently in the hospital for rhabdomyolysis, with her diffuse myalgias will obtain a CK.  Chest x-ray and UA to evaluate for infection.  Reassess.  Extensive work-up without a obvious finding for her symptoms.  Urine is negative for infection, chest x-ray viewed by me without obvious focal infiltrate.  Read by the radiologist as possible bilateral basilar pneumonia however on CT scan of the abdomen pelvis this was not present.  CK is normal.  UDS negative ammonia levels negative BNP is negative.  On reassessment the patient actually states that she feels much better.  Says that she would like to go home now.  Will discharge.  11:21 AM:  I have discussed the diagnosis/risks/treatment options with the patient and believe the pt to be eligible for discharge home to follow-up with PCP. We also discussed returning to the ED immediately if new or worsening sx occur. We discussed the sx which are most concerning (e.g., sudden worsening pain, fever, inability  to tolerate by mouth) that necessitate immediate return. Medications administered to the patient during their visit and any  new prescriptions provided to the patient are listed below.  Medications given during this visit Medications - No data to display   The patient appears reasonably screen and/or stabilized for discharge and I doubt any other medical condition or other [facility] requiring further screening, evaluation, or treatment in the ED at this time prior to discharge.    Final Clinical Impressions(s) / ED Diagnoses   Final diagnoses:  Transient alteration of awareness    ED Discharge Orders    None       Melene PlanFloyd, Harshika Mago, DO 06/05/19 1121

## 2019-06-05 NOTE — ED Triage Notes (Signed)
Pt here w/ [facility] from home. Pt co bilateral foot and hands numbness x2days. Caregiver states that she isnt acting herself. Pt co rt flank pain. Pt anxious.

## 2019-06-27 ENCOUNTER — Ambulatory Visit
Admission: RE | Admit: 2019-06-27 | Discharge: 2019-06-27 | Disposition: A | Discharge status: Home / Self Care | Payer: Medicare (Managed Care) | Source: Ambulatory Visit | Attending: Internal Medicine | Admitting: Internal Medicine

## 2019-06-27 ENCOUNTER — Other Ambulatory Visit: Payer: Self-pay

## 2019-06-27 DIAGNOSIS — Z1231 Encounter for screening mammogram for malignant neoplasm of breast: Secondary | ICD-10-CM

## 2019-08-31 ENCOUNTER — Other Ambulatory Visit: Payer: Self-pay

## 2019-08-31 ENCOUNTER — Emergency Department (HOSPITAL_COMMUNITY)
Admission: EM | Admit: 2019-08-31 | Discharge: 2019-08-31 | Disposition: A | Discharge status: Home / Self Care | Payer: Medicare (Managed Care) | Attending: Emergency Medicine | Admitting: Emergency Medicine

## 2019-08-31 ENCOUNTER — Encounter (HOSPITAL_COMMUNITY): Payer: Self-pay

## 2019-08-31 ENCOUNTER — Emergency Department (HOSPITAL_COMMUNITY): Payer: Medicare (Managed Care)

## 2019-08-31 DIAGNOSIS — Z20822 Contact with and (suspected) exposure to covid-19: Secondary | ICD-10-CM | POA: Insufficient documentation

## 2019-08-31 DIAGNOSIS — Z79899 Other long term (current) drug therapy: Secondary | ICD-10-CM | POA: Diagnosis not present

## 2019-08-31 DIAGNOSIS — Z7982 Long term (current) use of aspirin: Secondary | ICD-10-CM | POA: Diagnosis not present

## 2019-08-31 DIAGNOSIS — I2510 Atherosclerotic heart disease of native coronary artery without angina pectoris: Secondary | ICD-10-CM | POA: Insufficient documentation

## 2019-08-31 DIAGNOSIS — J9801 Acute bronchospasm: Secondary | ICD-10-CM | POA: Insufficient documentation

## 2019-08-31 DIAGNOSIS — R609 Edema, unspecified: Secondary | ICD-10-CM

## 2019-08-31 DIAGNOSIS — I10 Essential (primary) hypertension: Secondary | ICD-10-CM | POA: Insufficient documentation

## 2019-08-31 DIAGNOSIS — R0602 Shortness of breath: Secondary | ICD-10-CM | POA: Diagnosis present

## 2019-08-31 LAB — BRAIN NATRIURETIC PEPTIDE: B Natriuretic Peptide: 23.8 pg/mL (ref 0.0–100.0)

## 2019-08-31 LAB — CBC WITH DIFFERENTIAL/PLATELET
Abs Immature Granulocytes: 0.02 10*3/uL (ref 0.00–0.07)
Basophils Absolute: 0.1 10*3/uL (ref 0.0–0.1)
Basophils Relative: 1 %
Eosinophils Absolute: 0.5 10*3/uL (ref 0.0–0.5)
Eosinophils Relative: 8 %
HCT: 35.2 % — ABNORMAL LOW (ref 36.0–46.0)
Hemoglobin: 10.5 g/dL — ABNORMAL LOW (ref 12.0–15.0)
Immature Granulocytes: 0 %
Lymphocytes Relative: 22 %
Lymphs Abs: 1.3 10*3/uL (ref 0.7–4.0)
MCH: 27.4 pg (ref 26.0–34.0)
MCHC: 29.8 g/dL — ABNORMAL LOW (ref 30.0–36.0)
MCV: 91.9 fL (ref 80.0–100.0)
Monocytes Absolute: 0.4 10*3/uL (ref 0.1–1.0)
Monocytes Relative: 7 %
Neutro Abs: 3.7 10*3/uL (ref 1.7–7.7)
Neutrophils Relative %: 62 %
Platelets: 176 10*3/uL (ref 150–400)
RBC: 3.83 MIL/uL — ABNORMAL LOW (ref 3.87–5.11)
RDW: 16.3 % — ABNORMAL HIGH (ref 11.5–15.5)
WBC: 6 10*3/uL (ref 4.0–10.5)
nRBC: 0 % (ref 0.0–0.2)

## 2019-08-31 LAB — LACTIC ACID, PLASMA: Lactic Acid, Venous: 2.1 mmol/L (ref 0.5–1.9)

## 2019-08-31 LAB — COMPREHENSIVE METABOLIC PANEL
ALT: 19 U/L (ref 0–44)
AST: 29 U/L (ref 15–41)
Albumin: 3.9 g/dL (ref 3.5–5.0)
Alkaline Phosphatase: 87 U/L (ref 38–126)
Anion gap: 11 (ref 5–15)
BUN: 33 mg/dL — ABNORMAL HIGH (ref 8–23)
CO2: 30 mmol/L (ref 22–32)
Calcium: 9.3 mg/dL (ref 8.9–10.3)
Chloride: 99 mmol/L (ref 98–111)
Creatinine, Ser: 1.74 mg/dL — ABNORMAL HIGH (ref 0.44–1.00)
GFR calc Af Amer: 34 mL/min — ABNORMAL LOW (ref >60)
GFR calc non Af Amer: 29 mL/min — ABNORMAL LOW (ref >60)
Glucose, Bld: 185 mg/dL — ABNORMAL HIGH (ref 70–99)
Potassium: 4.1 mmol/L (ref 3.5–5.1)
Sodium: 140 mmol/L (ref 135–145)
Total Bilirubin: 0.3 mg/dL (ref 0.3–1.2)
Total Protein: 6.6 g/dL (ref 6.5–8.1)

## 2019-08-31 LAB — PROTIME-INR
INR: 0.9 (ref 0.8–1.2)
Prothrombin Time: 12.4 seconds (ref 11.4–15.2)

## 2019-08-31 LAB — TROPONIN I (HIGH SENSITIVITY): Troponin I (High Sensitivity): 10 ng/L (ref <18)

## 2019-08-31 LAB — PHOSPHORUS: Phosphorus: 3.3 mg/dL (ref 2.5–4.6)

## 2019-08-31 LAB — MAGNESIUM: Magnesium: 1.7 mg/dL (ref 1.7–2.4)

## 2019-08-31 MED ORDER — ALBUTEROL SULFATE HFA 108 (90 BASE) MCG/ACT IN AERS
4.0000 | INHALATION_SPRAY | RESPIRATORY_TRACT | Status: AC
  Filled 2019-08-31: qty 6.7

## 2019-08-31 MED ORDER — FUROSEMIDE 20 MG PO TABS: 20.0000 mg | ORAL_TABLET | Freq: Every day | ORAL | 0 refills | Status: DC

## 2019-08-31 MED ORDER — NITROGLYCERIN 2 % TD OINT: 1.0000 [in_us] | TOPICAL_OINTMENT | Freq: Four times a day (QID) | TRANSDERMAL | Status: DC

## 2019-08-31 MED ORDER — FUROSEMIDE 10 MG/ML IJ SOLN
40.0000 mg | Freq: Once | INTRAMUSCULAR | Status: AC
  Administered 2019-08-31: 40 mg via INTRAVENOUS
  Filled 2019-08-31: qty 4

## 2019-08-31 MED ORDER — ASPIRIN 81 MG PO CHEW: 324.0000 mg | CHEWABLE_TABLET | Freq: Once | ORAL | Status: DC

## 2019-08-31 MED ORDER — IPRATROPIUM BROMIDE HFA 17 MCG/ACT IN AERS
2.0000 | INHALATION_SPRAY | RESPIRATORY_TRACT | Status: AC
  Filled 2019-08-31: qty 12.9

## 2019-08-31 MED ORDER — METHYLPREDNISOLONE SODIUM SUCC 125 MG IJ SOLR: 125.0000 mg | Freq: Once | INTRAMUSCULAR | Status: DC

## 2019-08-31 NOTE — ED Notes (Signed)
Please call care giver and nurse at [phone] her name is paula

## 2019-08-31 NOTE — Discharge Instructions (Addendum)
1.  Return to the emergency department if your shortness of breath returns or new concerning symptoms develop. 2.  Continue all of your regular home medications. Take lasix as prescribed for the next 3 days. 3.  Call your family doctor tomorrow morning to schedule a follow-up appointment within the next 1 to 3 days.  Should have additional tests scheduled for further evaluation of shortness of breath.  You may need an echocardiogram and a stress test if you have not had one recently. Discuss this with your doctor as soon as possible.
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
## 2019-09-01 LAB — SARS CORONAVIRUS 2 (TAT 6-24 HRS): SARS Coronavirus 2: NEGATIVE

## 2020-01-04 ENCOUNTER — Other Ambulatory Visit: Payer: Self-pay | Admitting: Nurse Practitioner

## 2020-01-04 ENCOUNTER — Ambulatory Visit
Admission: RE | Admit: 2020-01-04 | Discharge: 2020-01-04 | Disposition: A | Discharge status: Home / Self Care | Payer: Medicare (Managed Care) | Source: Ambulatory Visit | Attending: Nurse Practitioner | Admitting: Nurse Practitioner

## 2020-01-04 DIAGNOSIS — M79671 Pain in right foot: Secondary | ICD-10-CM

## 2020-02-02 ENCOUNTER — Ambulatory Visit (INDEPENDENT_AMBULATORY_CARE_PROVIDER_SITE_OTHER): Payer: Medicare (Managed Care)

## 2020-02-02 ENCOUNTER — Encounter: Payer: Self-pay | Admitting: Podiatry

## 2020-02-02 ENCOUNTER — Other Ambulatory Visit: Payer: Self-pay

## 2020-02-02 ENCOUNTER — Ambulatory Visit (INDEPENDENT_AMBULATORY_CARE_PROVIDER_SITE_OTHER): Payer: Medicare (Managed Care) | Admitting: Podiatry

## 2020-02-02 DIAGNOSIS — M19079 Primary osteoarthritis, unspecified ankle and foot: Secondary | ICD-10-CM | POA: Diagnosis not present

## 2020-02-02 DIAGNOSIS — M778 Other enthesopathies, not elsewhere classified: Secondary | ICD-10-CM

## 2020-02-02 MED ORDER — DICLOFENAC SODIUM 1 % EX GEL: 4.0000 g | Freq: Four times a day (QID) | CUTANEOUS | 2 refills | Status: AC

## 2020-02-02 MED ORDER — METHYLPREDNISOLONE 4 MG PO TBPK: ORAL_TABLET | ORAL | 0 refills | Status: DC

## 2020-02-02 NOTE — Progress Notes (Signed)
Subjective:  Patient ID: [name], female    DOB: 04/19/1946,  MRN: [id number] HPI Chief Complaint  Patient presents with   Foot Pain    Medial foot/anterior ankle right - aching x few months, injuries when younger, they usually elevate her feet at night and elevations makes the pain worse   New Patient (Initial Visit)    74 y.o. female presents with the above complaint.   ROS: Denies fever chills nausea vomiting muscle aches pains calf pain back pain chest pain shortness of breath.  Presents today wheelchair-bound pain to the right foot dorsal medial aspect just distal to the tibialis anterior tendon.  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   ASTHMA 04/16/2007   CEREBROVASCULAR ACCIDENT, HX OF 04/16/2007   CORONARY ARTERY DISEASE 08/29/2008   Environmental allergies    Hyperlipidemia    HYPERTENSION 04/16/2007   Osteoarthritis    Stroke ([place])    URI 10/25/2009   Past Surgical History:  Procedure Laterality Date   CARDIAC CATHETERIZATION     TUBAL LIGATION      Current Outpatient Medications:    acetaminophen (TYLENOL) 500 MG tablet, Take 500-1,000 mg by mouth 3 (three) times daily as needed for mild pain. , Disp: , Rfl:    albuterol (ACCUNEB) 0.63 MG/3ML nebulizer solution, Take 3 mLs by nebulization 3 (three) times daily as needed for wheezing or shortness of breath., Disp: , Rfl:    albuterol (PROVENTIL HFA;VENTOLIN HFA) 108 (90 BASE) MCG/ACT inhaler, Inhale 2 puffs into the lungs every 6 (six) hours as needed for wheezing or shortness of breath., Disp: 1 Inhaler, Rfl: 2   amLODipine (NORVASC) 10 MG tablet, Take 1 tablet (10 mg total) by mouth daily., Disp: 30 tablet, Rfl: 5   aspirin EC 81 MG tablet, Take 81 mg by mouth daily., Disp: , Rfl:    atorvastatin (LIPITOR) 10 MG tablet, Take 10 mg by mouth daily., Disp: , Rfl:    budesonide-formoterol (SYMBICORT) 160-4.5 MCG/ACT inhaler, Inhale 2 puffs into the lungs 2 (two) times  daily. , Disp: , Rfl:    carvedilol (COREG) 25 MG tablet, Take 25 mg by mouth once., Disp: , Rfl:    citalopram (CELEXA) 20 MG tablet, Take 20 mg by mouth daily., Disp: , Rfl:    diclofenac Sodium (VOLTAREN) 1 % GEL, Apply 4 g topically 4 (four) times daily., Disp: 100 g, Rfl: 2   diphenhydrAMINE (BENADRYL) 25 MG tablet, Take 1 tablet (25 mg total) by mouth every 6 (six) hours. (Patient taking differently: Take 25 mg by mouth 3 (three) times daily as needed (lip swelling). ), Disp: 20 tablet, Rfl: 0   EPINEPHrine 0.3 mg/0.3 mL IJ SOAJ injection, Inject 0.3 mg into the muscle See admin instructions. Inject once as needed for allergic reaction. May repeat dose x1 after 5-15 minutes if needed., Disp: , Rfl:    fexofenadine (ALLEGRA) 60 MG tablet, Take 60 mg by mouth 2 (two) times daily., Disp: , Rfl:    fluconazole (DIFLUCAN) 100 MG tablet, Take 100 mg by mouth daily., Disp: , Rfl:    fluticasone (FLONASE) 50 MCG/ACT nasal spray, Place 1 spray into both nostrils daily., Disp: , Rfl:    furosemide (LASIX) 20 MG tablet, Take 1 tablet (20 mg total) by mouth daily., Disp: 3 tablet, Rfl: 0   guaiFENesin (ROBITUSSIN) 100 MG/5ML SOLN, Take 5 mLs by mouth every 4 (four) hours as needed for cough or to loosen phlegm., Disp: , Rfl:  Melatonin 5 MG TABS, Take 5 mg by mouth at bedtime as needed (insomnia). , Disp: , Rfl:    Menthol, Topical Analgesic, (BIOFREEZE EX), Apply 1 application topically 3 (three) times daily as needed (joint pain)., Disp: , Rfl:    methylPREDNISolone (MEDROL DOSEPAK) 4 MG TBPK tablet, 6 day dose pack - take as directed, Disp: 21 tablet, Rfl: 0   metoprolol tartrate (LOPRESSOR) 50 MG tablet, Take 75 mg by mouth See admin instructions. Take 1 1/2 tablets (75mg ) twice daily, Disp: , Rfl:    sennosides-docusate sodium (SENOKOT-S) 8.6-50 MG tablet, Take 1-2 tablets by mouth 2 (two) times daily., Disp: , Rfl:    spironolactone (ALDACTONE) 25 MG tablet, Take 25 mg by mouth  daily. , Disp: , Rfl:   Allergies  Allergen Reactions   Lisinopril Swelling   Zolpidem Tartrate     Other reaction(s): Agitation   Eggs Or Egg-Derived Products     Upset stomach    Review of Systems Objective:  There were no vitals filed for this visit.  General: Well developed, nourished, in no acute distress, alert and oriented x3   Dermatological: Skin is warm, dry and supple bilateral. Nails x 10 are well maintained; remaining integument appears unremarkable at this time. There are no open sores, no preulcerative lesions, no rash or signs of infection present.  Vascular: Dorsalis Pedis artery and Posterior Tibial artery pedal pulses are 2/4 bilateral with immedate capillary fill time. Pedal hair growth present. No varicosities and no lower extremity edema present bilateral.   Neruologic: Grossly intact via light touch bilateral. Vibratory intact via tuning fork bilateral. Protective threshold with Semmes Wienstein monofilament intact to all pedal sites bilateral. Patellar and Achilles deep tendon reflexes 2+ bilateral. No Babinski or clonus noted bilateral.   Musculoskeletal: No gross boney pedal deformities bilateral. No pain, crepitus, or limitation noted with foot and ankle range of motion bilateral. Muscular strength 5/5 in all groups tested bilateral.  Swelling to the bilateral lower extremity significant hypertrophy most likely secondary to induration from the edema.  The skin is pink but not erythematous.  She has pain on palpation of the dorsal medial aspect of the right foot just distal to the extensor tibialis anterior.  She has some tenderness on palpation of the first metatarsal through its entire length.  But she has good range of motion on dorsiflexion and plantarflexion.  Gait: Unassisted, Nonantalgic.    Radiographs:  Radiographs taken today demonstrate an osseously mature individual with significant osteopenia.  Significant osteoarthritis of the midfoot no acute  findings noted.  No fractures are identified.  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  No acute findings identified radiographically.  Plan: Offered her an injection to the area she declined.  At this point secondary to her history of hypertension we will going give her methylprednisolone instead of a nonsteroidal oral.  I did recommend a topical anti-inflammatory such as Voltaren gel.     Cathalina Barcia T. Vowinckel, [place]

## 2020-05-02 ENCOUNTER — Other Ambulatory Visit: Payer: Self-pay | Admitting: Nurse Practitioner

## 2020-05-02 DIAGNOSIS — E041 Nontoxic single thyroid nodule: Secondary | ICD-10-CM

## 2020-05-11 ENCOUNTER — Ambulatory Visit
Admission: RE | Admit: 2020-05-11 | Discharge: 2020-05-11 | Disposition: A | Discharge status: Home / Self Care | Payer: Medicare (Managed Care) | Source: Ambulatory Visit | Attending: Nurse Practitioner | Admitting: Nurse Practitioner

## 2020-05-11 DIAGNOSIS — E041 Nontoxic single thyroid nodule: Secondary | ICD-10-CM

## 2021-01-31 ENCOUNTER — Other Ambulatory Visit: Payer: Self-pay

## 2021-01-31 ENCOUNTER — Encounter (HOSPITAL_COMMUNITY): Payer: Self-pay

## 2021-01-31 ENCOUNTER — Emergency Department (HOSPITAL_COMMUNITY): Payer: Medicare (Managed Care)

## 2021-01-31 ENCOUNTER — Emergency Department (HOSPITAL_COMMUNITY)
Admission: EM | Admit: 2021-01-31 | Discharge: 2021-01-31 | Disposition: A | Discharge status: Home / Self Care | Payer: Medicare (Managed Care) | Attending: Emergency Medicine | Admitting: Emergency Medicine

## 2021-01-31 DIAGNOSIS — Z7982 Long term (current) use of aspirin: Secondary | ICD-10-CM | POA: Insufficient documentation

## 2021-01-31 DIAGNOSIS — Z7951 Long term (current) use of inhaled steroids: Secondary | ICD-10-CM | POA: Diagnosis not present

## 2021-01-31 DIAGNOSIS — J45909 Unspecified asthma, uncomplicated: Secondary | ICD-10-CM | POA: Insufficient documentation

## 2021-01-31 DIAGNOSIS — Z79899 Other long term (current) drug therapy: Secondary | ICD-10-CM | POA: Insufficient documentation

## 2021-01-31 DIAGNOSIS — R0602 Shortness of breath: Secondary | ICD-10-CM | POA: Diagnosis present

## 2021-01-31 DIAGNOSIS — R0600 Dyspnea, unspecified: Secondary | ICD-10-CM | POA: Diagnosis not present

## 2021-01-31 DIAGNOSIS — I2510 Atherosclerotic heart disease of native coronary artery without angina pectoris: Secondary | ICD-10-CM | POA: Diagnosis not present

## 2021-01-31 DIAGNOSIS — I10 Essential (primary) hypertension: Secondary | ICD-10-CM | POA: Diagnosis not present

## 2021-01-31 DIAGNOSIS — R1011 Right upper quadrant pain: Secondary | ICD-10-CM | POA: Diagnosis not present

## 2021-01-31 DIAGNOSIS — R600 Localized edema: Secondary | ICD-10-CM | POA: Diagnosis not present

## 2021-01-31 LAB — HEPATIC FUNCTION PANEL
ALT: 20 U/L (ref 0–44)
AST: 29 U/L (ref 15–41)
Albumin: 3.9 g/dL (ref 3.5–5.0)
Alkaline Phosphatase: 91 U/L (ref 38–126)
Bilirubin, Direct: 0.2 mg/dL (ref 0.0–0.2)
Indirect Bilirubin: 0.4 mg/dL (ref 0.3–0.9)
Total Bilirubin: 0.6 mg/dL (ref 0.3–1.2)
Total Protein: 7.1 g/dL (ref 6.5–8.1)

## 2021-01-31 LAB — I-STAT VENOUS BLOOD GAS, ED
Acid-Base Excess: 2 mmol/L (ref 0.0–2.0)
Bicarbonate: 26.8 mmol/L (ref 20.0–28.0)
Calcium, Ion: 1.04 mmol/L — ABNORMAL LOW (ref 1.15–1.40)
HCT: 28 % — ABNORMAL LOW (ref 36.0–46.0)
Hemoglobin: 9.5 g/dL — ABNORMAL LOW (ref 12.0–15.0)
O2 Saturation: 98 %
Potassium: 3.8 mmol/L (ref 3.5–5.1)
Sodium: 143 mmol/L (ref 135–145)
TCO2: 28 mmol/L (ref 22–32)
pCO2, Ven: 41.4 mmHg — ABNORMAL LOW (ref 44.0–60.0)
pH, Ven: 7.418 (ref 7.250–7.430)
pO2, Ven: 105 mmHg — ABNORMAL HIGH (ref 32.0–45.0)

## 2021-01-31 LAB — BASIC METABOLIC PANEL
Anion gap: 6 (ref 5–15)
BUN: 28 mg/dL — ABNORMAL HIGH (ref 8–23)
CO2: 30 mmol/L (ref 22–32)
Calcium: 9.4 mg/dL (ref 8.9–10.3)
Chloride: 103 mmol/L (ref 98–111)
Creatinine, Ser: 1.5 mg/dL — ABNORMAL HIGH (ref 0.44–1.00)
GFR, Estimated: 37 mL/min — ABNORMAL LOW (ref >60)
Glucose, Bld: 126 mg/dL — ABNORMAL HIGH (ref 70–99)
Potassium: 4.3 mmol/L (ref 3.5–5.1)
Sodium: 139 mmol/L (ref 135–145)

## 2021-01-31 LAB — CBC
HCT: 34.5 % — ABNORMAL LOW (ref 36.0–46.0)
Hemoglobin: 10.5 g/dL — ABNORMAL LOW (ref 12.0–15.0)
MCH: 28.5 pg (ref 26.0–34.0)
MCHC: 30.4 g/dL (ref 30.0–36.0)
MCV: 93.5 fL (ref 80.0–100.0)
Platelets: 205 10*3/uL (ref 150–400)
RBC: 3.69 MIL/uL — ABNORMAL LOW (ref 3.87–5.11)
RDW: 15.6 % — ABNORMAL HIGH (ref 11.5–15.5)
WBC: 4.8 10*3/uL (ref 4.0–10.5)
nRBC: 0 % (ref 0.0–0.2)

## 2021-01-31 LAB — TROPONIN I (HIGH SENSITIVITY)
Troponin I (High Sensitivity): 11 ng/L (ref <18)
Troponin I (High Sensitivity): 11 ng/L (ref <18)

## 2021-01-31 LAB — LIPASE, BLOOD: Lipase: 34 U/L (ref 11–51)

## 2021-01-31 NOTE — ED Triage Notes (Addendum)
Pt arrived via [facility] for home for c/o SOBx2 days. Per EMS pt used nebulizer w/albuterol at 0800 today at home w/o relief. Per EMS, pt has edema of LE bilat and lung sound are diminished in RLL, rales in LUL. Pt is A&Ox3 to self, person and situation. Pt has non labored breathing.

## 2021-01-31 NOTE — ED Notes (Signed)
Pt is confused and I can't get pt to sign MSE and family not present.

## 2021-01-31 NOTE — Discharge Instructions (Addendum)
Work-up here reveals no new acute reason for your shortness of breath. Please return if you are having any worsening symptoms.  Call your doctor for follow-up this week.

## 2021-01-31 NOTE — ED Notes (Signed)
I notified pt's daughter Karoline Caldwell that pt is coming home.

## 2021-01-31 NOTE — ED Provider Notes (Signed)
[facility] EMERGENCY DEPARTMENT Provider Note   CSN: [id number] Arrival date & time: 01/31/21  3220     History Chief Complaint  Patient presents with  . Shortness of Breath    [name] is a 75 y.o. female.  HPI 75 year old female presents today via EMS with complaints of shortness of breath.  Reviewed triage note that states patient's been short of breath for 2 days.  EMS reports that she she had a nebulizer at home today without relief.  EMS reports edema of lower extremities and lung sounds diminished in the right lower lobe with rales in the left upper lobe.  Patient reports to me that she has chronic dyspnea and does not identify her breathing today as different.  She states her home health nurse came in and felt that she was more short of breath and called 911.  Patient denies any fever, chills, cough, chest pain, abdominal pain, nausea, vomiting, or diarrhea.    Past Medical History:  Diagnosis Date  . 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 ASTHMA 04/16/2007  . 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 CORONARY ARTERY DISEASE 08/29/2008  . Environmental allergies   . Hyperlipidemia   . HYPERTENSION 04/16/2007  . Osteoarthritis   . Stroke (HCC)   . URI 10/25/2009    Patient Active Problem List   Diagnosis Date Noted  . Fall 02/28/2019  . Bilateral foot pain 02/28/2019  . Rhabdomyolysis 02/27/2019  . UTI (urinary tract infection) 10/06/2014  . Acute encephalopathy 10/04/2014  . Acute CVA (cerebrovascular accident) (HCC) 10/02/2014  . Hyperlipidemia 10/02/2014  . Legally blind 10/02/2014  . 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 Medical non-compliance 10/02/2014  . Uncomplicated asthma 10/02/2014  . Impaired glucose tolerance 12/14/2013  . Obesity 12/14/2013  . URI 10/25/2009  . Coronary atherosclerosis 08/29/2008  . Essential hypertension 04/16/2007  . ASTHMA 04/16/2007  . 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 History of cardiovascular disorder 04/16/2007     Past Surgical History:  Procedure Laterality Date  . CARDIAC CATHETERIZATION    . TUBAL LIGATION       OB History   No obstetric history on file.     Family History  Problem Relation Age of Onset  . Asthma Mother   . Asthma Sister     Social History   Tobacco Use  . Smoking status: Never  . Smokeless tobacco: Never  Vaping Use  . Vaping Use: Never used  Substance Use Topics  . Alcohol use: No  . Drug use: No    Home Medications Prior to Admission medications   Medication Sig Start Date End Date Taking? Authorizing Provider  acetaminophen (TYLENOL) 500 MG tablet Take 500-1,000 mg by mouth 3 (three) times daily as needed for mild pain.     [provider]  albuterol (ACCUNEB) 0.63 MG/3ML nebulizer solution Take 3 mLs by nebulization 3 (three) times daily as needed for wheezing or shortness of breath.    [provider]  albuterol (PROVENTIL HFA;VENTOLIN HFA) 108 (90 BASE) MCG/ACT inhaler Inhale 2 puffs into the lungs every 6 (six) hours as needed for wheezing or shortness of breath. 03/20/15   Gordy Savers, MD  amLODipine (NORVASC) 10 MG tablet Take 1 tablet (10 mg total) by mouth daily. 03/01/15   Gordy Savers, MD  aspirin EC 81 MG tablet Take 81 mg by mouth daily.    [provider]  atorvastatin (LIPITOR) 10 MG tablet Take 10 mg by mouth daily.    [provider]  budesonide-formoterol (SYMBICORT) 160-4.5 MCG/ACT inhaler Inhale 2 puffs into the lungs 2 (two) times daily.     [provider]  carvedilol (COREG) 25 MG tablet Take 25 mg by mouth once. 08/12/19   [provider]  citalopram (CELEXA) 20 MG tablet Take 20 mg by mouth daily.    [provider]  diclofenac Sodium (VOLTAREN) 1 % GEL Apply 4 g topically 4 (four) times daily. 02/02/20   Hyatt, Max T, DPM  diphenhydrAMINE (BENADRYL) 25 MG tablet Take 1 tablet (25 mg total) by mouth every 6 (six) hours. Patient taking differently: Take 25  mg by mouth 3 (three) times daily as needed (lip swelling).  02/08/18   Derwood Kaplan, MD  EPINEPHrine 0.3 mg/0.3 mL IJ SOAJ injection Inject 0.3 mg into the muscle See admin instructions. Inject once as needed for allergic reaction. May repeat dose x1 after 5-15 minutes if needed.    [provider]  fexofenadine (ALLEGRA) 60 MG tablet Take 60 mg by mouth 2 (two) times daily.    [provider]  fluconazole (DIFLUCAN) 100 MG tablet Take 100 mg by mouth daily. 05/27/19   [provider]  fluticasone (FLONASE) 50 MCG/ACT nasal spray Place 1 spray into both nostrils daily.    [provider]  furosemide (LASIX) 20 MG tablet Take 1 tablet (20 mg total) by mouth daily. 08/31/19   Arby Barrette, MD  guaiFENesin (ROBITUSSIN) 100 MG/5ML SOLN Take 5 mLs by mouth every 4 (four) hours as needed for cough or to loosen phlegm.    [provider]  Melatonin 5 MG TABS Take 5 mg by mouth at bedtime as needed (insomnia).     [provider]  Menthol, Topical Analgesic, (BIOFREEZE EX) Apply 1 application topically 3 (three) times daily as needed (joint pain).    [provider]  methylPREDNISolone (MEDROL DOSEPAK) 4 MG TBPK tablet 6 day dose pack - take as directed 02/02/20   Hyatt, Max T, DPM  metoprolol tartrate (LOPRESSOR) 50 MG tablet Take 75 mg by mouth See admin instructions. Take 1 1/2 tablets (75mg ) twice daily    [provider]  sennosides-docusate sodium (SENOKOT-S) 8.6-50 MG tablet Take 1-2 tablets by mouth 2 (two) times daily.    [provider]  spironolactone (ALDACTONE) 25 MG tablet Take 25 mg by mouth daily.     [provider]  losartan-hydrochlorothiazide (HYZAAR) 100-25 MG per tablet Take 1 tablet by mouth daily. 11/03/11 11/14/11  11/16/11 B, FNP    Allergies    Lisinopril, Zolpidem tartrate, and Eggs or egg-derived products  Review of Systems   Review of Systems  All other systems reviewed and are  negative.  Physical Exam Updated Vital Signs BP (!) 119/50   Pulse 64   Temp 98.1 F (36.7 C) (Oral)   Resp 18   Ht 1.6 m (5\' 3" )   Wt 112.5 kg   SpO2 98%   BMI 43.93 kg/m   Physical Exam Vitals reviewed.  Constitutional:      General: She is not in acute distress.    Appearance: She is well-developed. She is obese. She is ill-appearing.  HENT:     Head: Normocephalic.     Mouth/Throat:     Mouth: Mucous membranes are moist.  Eyes:     Pupils: Pupils are equal, round, and reactive to light.  Cardiovascular:     Rate and Rhythm: Normal rate and regular rhythm.  Pulmonary:     Effort:  Pulmonary effort is normal.     Breath sounds: Decreased breath sounds present. No wheezing, rhonchi or rales.  Abdominal:     Palpations: Abdomen is soft.     Tenderness: There is abdominal tenderness.     Comments: Some tenderness palpation in right upper quadrant  Musculoskeletal:        General: Normal range of motion.     Cervical back: Normal range of motion.     Right lower leg: No tenderness. Edema present.     Left lower leg: No tenderness. Edema present.  Skin:    General: Skin is warm.     Capillary Refill: Capillary refill takes less than 2 seconds.  Neurological:     General: No focal deficit present.     Mental Status: She is alert.  Psychiatric:        Mood and Affect: Mood normal.    ED Results / Procedures / Treatments   Labs (all labs ordered are listed, but only abnormal results are displayed) Labs Reviewed  BASIC METABOLIC PANEL  CBC  TROPONIN I (HIGH SENSITIVITY)    EKG EKG Interpretation  Date/Time:  Thursday January 31 2021 13:07:12 EDT Ventricular Rate:  54 PR Interval:  213 QRS Duration: 95 QT Interval:  466 QTC Calculation: 450 R Axis:   13 Text Interpretation: Sinus rhythm Borderline prolonged PR interval Low voltage, precordial leads Consider anterior infarct Confirmed by Margarita Grizzle [phone]) on 01/31/2021 1:09:16 PM  Radiology DG Chest  Portable 1 View  Result Date: 01/31/2021 CLINICAL DATA:  Shortness of breath EXAM: PORTABLE CHEST 1 VIEW COMPARISON:  August 31, 2019 FINDINGS: No edema or airspace opacity. Heart is mildly enlarged with pulmonary vascularity normal. No adenopathy. No pneumothorax. No bone lesions. IMPRESSION: No edema or airspace opacity.  Stable cardiac prominence. Electronically Signed   By: Bretta Bang III M.D.   On: 01/31/2021 09:55    Procedures Procedures   Medications Ordered in ED Medications - No data to display  ED Course  I have reviewed the triage vital signs and the nursing notes.  Pertinent labs & imaging results that were available during my care of the patient were reviewed by me and considered in my medical decision making (see chart for details).    MDM Rules/Calculators/A&P                          75 yo female ho asthma, hypertension, stroke presents with co dyspnea.  Here work up wnl, no acute abnormality on cxr, ekg, labs. Chronic anemia present with hgb 10, sats 98% and other vitals without acute changes from baseline. Patient appears stable for discharge.  Discussed return precautions and need for follow-up and voiced understanding. Final Clinical Impression(s) / ED Diagnoses Final diagnoses:  None    Rx / DC Orders ED Discharge Orders     None        Margarita Grizzle, MD 02/01/21 [phone]

## 2021-01-31 NOTE — ED Notes (Signed)
[facility] came to transport pt home.  

## 2021-04-14 ENCOUNTER — Encounter (HOSPITAL_COMMUNITY): Payer: Self-pay | Admitting: Emergency Medicine

## 2021-04-14 ENCOUNTER — Emergency Department (HOSPITAL_COMMUNITY): Payer: Medicare (Managed Care)

## 2021-04-14 ENCOUNTER — Emergency Department (HOSPITAL_COMMUNITY)
Admission: EM | Admit: 2021-04-14 | Discharge: 2021-04-14 | Disposition: A | Discharge status: Home / Self Care | Payer: Medicare (Managed Care) | Attending: Emergency Medicine | Admitting: Emergency Medicine

## 2021-04-14 DIAGNOSIS — R531 Weakness: Secondary | ICD-10-CM | POA: Diagnosis not present

## 2021-04-14 DIAGNOSIS — M25552 Pain in left hip: Secondary | ICD-10-CM | POA: Insufficient documentation

## 2021-04-14 DIAGNOSIS — Z79899 Other long term (current) drug therapy: Secondary | ICD-10-CM | POA: Diagnosis not present

## 2021-04-14 DIAGNOSIS — I10 Essential (primary) hypertension: Secondary | ICD-10-CM | POA: Insufficient documentation

## 2021-04-14 DIAGNOSIS — J45909 Unspecified asthma, uncomplicated: Secondary | ICD-10-CM | POA: Diagnosis not present

## 2021-04-14 DIAGNOSIS — Z7982 Long term (current) use of aspirin: Secondary | ICD-10-CM | POA: Diagnosis not present

## 2021-04-14 DIAGNOSIS — I2510 Atherosclerotic heart disease of native coronary artery without angina pectoris: Secondary | ICD-10-CM | POA: Insufficient documentation

## 2021-04-14 LAB — CBC WITH DIFFERENTIAL/PLATELET
Abs Immature Granulocytes: 0.01 10*3/uL (ref 0.00–0.07)
Basophils Absolute: 0 10*3/uL (ref 0.0–0.1)
Basophils Relative: 1 %
Eosinophils Absolute: 0.1 10*3/uL (ref 0.0–0.5)
Eosinophils Relative: 2 %
HCT: 36.2 % (ref 36.0–46.0)
Hemoglobin: 11.1 g/dL — ABNORMAL LOW (ref 12.0–15.0)
Immature Granulocytes: 0 %
Lymphocytes Relative: 20 %
Lymphs Abs: 1.1 10*3/uL (ref 0.7–4.0)
MCH: 28.8 pg (ref 26.0–34.0)
MCHC: 30.7 g/dL (ref 30.0–36.0)
MCV: 93.8 fL (ref 80.0–100.0)
Monocytes Absolute: 0.4 10*3/uL (ref 0.1–1.0)
Monocytes Relative: 7 %
Neutro Abs: 3.8 10*3/uL (ref 1.7–7.7)
Neutrophils Relative %: 70 %
Platelets: 207 10*3/uL (ref 150–400)
RBC: 3.86 MIL/uL — ABNORMAL LOW (ref 3.87–5.11)
RDW: 15.9 % — ABNORMAL HIGH (ref 11.5–15.5)
WBC: 5.4 10*3/uL (ref 4.0–10.5)
nRBC: 0 % (ref 0.0–0.2)

## 2021-04-14 LAB — BASIC METABOLIC PANEL
Anion gap: 7 (ref 5–15)
BUN: 18 mg/dL (ref 8–23)
CO2: 33 mmol/L — ABNORMAL HIGH (ref 22–32)
Calcium: 9.7 mg/dL (ref 8.9–10.3)
Chloride: 102 mmol/L (ref 98–111)
Creatinine, Ser: 1.5 mg/dL — ABNORMAL HIGH (ref 0.44–1.00)
GFR, Estimated: 37 mL/min — ABNORMAL LOW (ref >60)
Glucose, Bld: 126 mg/dL — ABNORMAL HIGH (ref 70–99)
Potassium: 4.5 mmol/L (ref 3.5–5.1)
Sodium: 142 mmol/L (ref 135–145)

## 2021-04-14 LAB — URINALYSIS, ROUTINE W REFLEX MICROSCOPIC
Bilirubin Urine: NEGATIVE
Glucose, UA: NEGATIVE mg/dL
Hgb urine dipstick: NEGATIVE
Ketones, ur: NEGATIVE mg/dL
Leukocytes,Ua: NEGATIVE
Nitrite: NEGATIVE
Protein, ur: NEGATIVE mg/dL
Specific Gravity, Urine: 1.014 (ref 1.005–1.030)
pH: 8 (ref 5.0–8.0)

## 2021-04-14 NOTE — ED Notes (Signed)
Patient placed on pure wick °

## 2021-04-14 NOTE — ED Notes (Signed)
ED Provider at bedside. 

## 2021-04-14 NOTE — ED Provider Notes (Addendum)
[facility] EMERGENCY DEPARTMENT Provider Note   CSN: [id number] Arrival date & time: 04/14/21  0945     History Chief Complaint  Patient presents with   Leg Pain    [name] is a 75 y.o. female.  Pt presents to the ED today from home with weakness.  Pt mainly complains of left hip pain to me.  Pt is unable to tell me how long she has had the pain.  She is a poor historian.  No fevers.  No n/v.  Pt is wheelchair bound.      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   ASTHMA 04/16/2007   CEREBROVASCULAR ACCIDENT, HX OF 04/16/2007   CORONARY ARTERY DISEASE 08/29/2008   Environmental allergies    Hyperlipidemia    HYPERTENSION 04/16/2007   Osteoarthritis    Stroke (HCC)    URI 10/25/2009    Patient Active Problem List   Diagnosis Date Noted   Fall 02/28/2019   Bilateral foot pain 02/28/2019   Rhabdomyolysis 02/27/2019   UTI (urinary tract infection) 10/06/2014   Acute encephalopathy 10/04/2014   Acute CVA (cerebrovascular accident) (HCC) 10/02/2014   Hyperlipidemia 10/02/2014   Legally blind 10/02/2014   Malignant hypertension 10/02/2014   Medical non-compliance 10/02/2014   Uncomplicated asthma 10/02/2014   Impaired glucose tolerance 12/14/2013   Obesity 12/14/2013   URI 10/25/2009   Coronary atherosclerosis 08/29/2008   Essential hypertension 04/16/2007   ASTHMA 04/16/2007   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     Family History  Problem Relation Age of Onset   Asthma Mother    Asthma Sister     Social History   Tobacco Use   Smoking status: Never   Smokeless tobacco: Never  Vaping Use   Vaping Use: Never used  Substance Use Topics   Alcohol use: No   Drug use: No    Home Medications Prior to Admission  medications   Medication Sig Start Date End Date Taking? Authorizing Provider  acetaminophen (TYLENOL) 500 MG tablet Take 500-1,000 mg by mouth 3 (three) times daily as needed for mild pain.     [provider]  albuterol (ACCUNEB) 0.63 MG/3ML nebulizer solution Take 3 mLs by nebulization 3 (three) times daily as needed for wheezing or shortness of breath.    [provider]  albuterol (PROVENTIL HFA;VENTOLIN HFA) 108 (90 BASE) MCG/ACT inhaler Inhale 2 puffs into the lungs every 6 (six) hours as needed for wheezing or shortness of breath. 03/20/15   Gordy Savers, MD  amLODipine (NORVASC) 10 MG tablet Take 1 tablet (10 mg total) by mouth daily. 03/01/15   Gordy Savers, MD  aspirin EC 81 MG tablet Take 81 mg by mouth daily.    [provider]  atorvastatin (LIPITOR) 10 MG tablet Take 10 mg by mouth daily.    [provider]  budesonide-formoterol (SYMBICORT) 160-4.5 MCG/ACT inhaler Inhale 2 puffs into the lungs 2 (two) times daily.     [provider]  carvedilol (COREG) 25 MG tablet Take 25 mg by mouth once. 08/12/19   [provider]  citalopram (CELEXA) 20 MG tablet Take 20 mg by mouth daily.    [provider]  diclofenac Sodium (VOLTAREN) 1 % GEL  Apply 4 g topically 4 (four) times daily. 02/02/20   Hyatt, Max T, DPM  diphenhydrAMINE (BENADRYL) 25 MG tablet Take 1 tablet (25 mg total) by mouth every 6 (six) hours. Patient taking differently: Take 25 mg by mouth 3 (three) times daily as needed (lip swelling).  02/08/18   Derwood Kaplan, MD  EPINEPHrine 0.3 mg/0.3 mL IJ SOAJ injection Inject 0.3 mg into the muscle See admin instructions. Inject once as needed for allergic reaction. May repeat dose x1 after 5-15 minutes if needed.    [provider]  fexofenadine (ALLEGRA) 60 MG tablet Take 60 mg by mouth 2 (two) times daily.    [provider]  fluconazole (DIFLUCAN) 100 MG tablet Take 100 mg by mouth daily.  05/27/19   [provider]  fluticasone (FLONASE) 50 MCG/ACT nasal spray Place 1 spray into both nostrils daily.    [provider]  furosemide (LASIX) 20 MG tablet Take 1 tablet (20 mg total) by mouth daily. 08/31/19   Arby Barrette, MD  guaiFENesin (ROBITUSSIN) 100 MG/5ML SOLN Take 5 mLs by mouth every 4 (four) hours as needed for cough or to loosen phlegm.    [provider]  Melatonin 5 MG TABS Take 5 mg by mouth at bedtime as needed (insomnia).     [provider]  Menthol, Topical Analgesic, (BIOFREEZE EX) Apply 1 application topically 3 (three) times daily as needed (joint pain).    [provider]  methylPREDNISolone (MEDROL DOSEPAK) 4 MG TBPK tablet 6 day dose pack - take as directed 02/02/20   Hyatt, Max T, DPM  metoprolol tartrate (LOPRESSOR) 50 MG tablet Take 75 mg by mouth See admin instructions. Take 1 1/2 tablets (75mg ) twice daily    [provider]  sennosides-docusate sodium (SENOKOT-S) 8.6-50 MG tablet Take 1-2 tablets by mouth 2 (two) times daily.    [provider]  spironolactone (ALDACTONE) 25 MG tablet Take 25 mg by mouth daily.     [provider]  losartan-hydrochlorothiazide (HYZAAR) 100-25 MG per tablet Take 1 tablet by mouth daily. 11/03/11 11/14/11  11/16/11 B, FNP    Allergies    Lisinopril, Zolpidem tartrate, and Eggs or egg-derived products  Review of Systems   Review of Systems  Musculoskeletal:        Left hip pain  Neurological:  Positive for weakness.  All other systems reviewed and are negative.  Physical Exam Updated Vital Signs BP (!) 132/55   Pulse 60   Temp 97.9 F (36.6 C) (Oral)   Resp 15   SpO2 94%   Physical Exam Vitals and nursing note reviewed.  Constitutional:      Appearance: Normal appearance.  HENT:     Head: Normocephalic and atraumatic.     Right Ear: External ear normal.     Left Ear: External ear normal.     Nose: Nose normal.     Mouth/Throat:      Mouth: Mucous membranes are moist.     Pharynx: Oropharynx is clear.  Eyes:     Extraocular Movements: Extraocular movements intact.     Conjunctiva/sclera: Conjunctivae normal.     Pupils: Pupils are equal, round, and reactive to light.  Cardiovascular:     Rate and Rhythm: Normal rate and regular rhythm.     Pulses: Normal pulses.     Heart sounds: Normal heart sounds.  Pulmonary:     Effort: Pulmonary effort is normal.     Breath sounds: Normal breath sounds.  Abdominal:     General: Abdomen is flat. Bowel sounds are normal.     Palpations: Abdomen is soft.  Musculoskeletal:     Cervical back: Normal range of motion and neck supple.       Legs:  Skin:    General: Skin is warm.     Capillary Refill: Capillary refill takes less than 2 seconds.     Comments: Unna boots on both legs.  No drainage noted on dressing.  Neurological:     Mental Status: She is alert and oriented to person, place, and time.     Comments: Left sided weakness (chronic)   Psychiatric:        Mood and Affect: Mood normal.    ED Results / Procedures / Treatments   Labs (all labs ordered are listed, but only abnormal results are displayed) Labs Reviewed  BASIC METABOLIC PANEL - Abnormal; Notable for the following components:      Result Value   CO2 33 (*)    Glucose, Bld 126 (*)    Creatinine, Ser 1.50 (*)    GFR, Estimated 37 (*)    All other components within normal limits  CBC WITH DIFFERENTIAL/PLATELET - Abnormal; Notable for the following components:   RBC 3.86 (*)    Hemoglobin 11.1 (*)    RDW 15.9 (*)    All other components within normal limits  URINALYSIS, ROUTINE W REFLEX MICROSCOPIC - Abnormal; Notable for the following components:   APPearance HAZY (*)    All other components within normal limits    EKG None  Radiology DG Hip Unilat W or Wo Pelvis 2-3 Views Left  Result Date: 04/14/2021 CLINICAL DATA:  Left leg pain and generalized weakness. EXAM: DG HIP (WITH OR WITHOUT  PELVIS) 2-3V LEFT COMPARISON:  02/27/2019 FINDINGS: No fracture or bone lesion. Hip joints, SI joints and symphysis pubis are normally spaced and aligned. Iliac and femoral artery arterial vascular calcifications are noted. Soft tissues otherwise unremarkable. IMPRESSION: 1. No fracture or acute finding. No significant hip joint abnormality. Electronically Signed   By: Amie Portland M.D.   On: 04/14/2021 11:12    Procedures Procedures   Medications Ordered in ED Medications - No data to display  ED Course  I have reviewed the triage vital signs and the nursing notes.  Pertinent labs & imaging results that were available during my care of the patient were reviewed by me and considered in my medical decision making (see chart for details).    MDM Rules/Calculators/A&P                           Pt has not wanted any pain meds here.  Xray neg and without a trauma, I don't think I need to do a CT scan.  The rest of her eval was neg.  Pt has been awake and alert.  She is eating and drinking.  Her daughter said her normal lift chair broke and she's been having to use a different one that is not as comfortable.   Pt will go home via [facility].  Final Clinical Impression(s) / ED Diagnoses Final diagnoses:  Hip pain, acute, left    Rx / DC Orders ED Discharge Orders     None        Jacalyn Lefevre, MD 04/14/21 1409    Jacalyn Lefevre, MD 04/14/21 1421

## 2021-04-14 NOTE — ED Triage Notes (Signed)
Pt from home via [facility]. Pt reports left leg pain and generalized weakness. Per Ems pt is non ambulatory at her baseline.

## 2021-04-14 NOTE — ED Notes (Addendum)
Pt daughter [name] called and notified that pt would be coming home via EMS. [name] stated that pt lives at home with pt son. [name] stated she would leave work and go to patient house to be with her to meet EMS.

## 2022-02-05 ENCOUNTER — Emergency Department (HOSPITAL_COMMUNITY): Payer: Medicare (Managed Care)

## 2022-02-05 ENCOUNTER — Encounter (HOSPITAL_COMMUNITY): Payer: Self-pay

## 2022-02-05 ENCOUNTER — Other Ambulatory Visit: Payer: Self-pay

## 2022-02-05 ENCOUNTER — Emergency Department (HOSPITAL_COMMUNITY)
Admission: EM | Admit: 2022-02-05 | Discharge: 2022-02-06 | Disposition: A | Discharge status: Home / Self Care | Payer: Medicare (Managed Care) | Attending: Emergency Medicine | Admitting: Emergency Medicine

## 2022-02-05 DIAGNOSIS — Z7982 Long term (current) use of aspirin: Secondary | ICD-10-CM | POA: Insufficient documentation

## 2022-02-05 DIAGNOSIS — Y92009 Unspecified place in unspecified non-institutional (private) residence as the place of occurrence of the external cause: Secondary | ICD-10-CM | POA: Diagnosis not present

## 2022-02-05 DIAGNOSIS — I2510 Atherosclerotic heart disease of native coronary artery without angina pectoris: Secondary | ICD-10-CM | POA: Diagnosis not present

## 2022-02-05 DIAGNOSIS — R531 Weakness: Secondary | ICD-10-CM | POA: Diagnosis not present

## 2022-02-05 DIAGNOSIS — S300XXA Contusion of lower back and pelvis, initial encounter: Secondary | ICD-10-CM | POA: Insufficient documentation

## 2022-02-05 DIAGNOSIS — S40011A Contusion of right shoulder, initial encounter: Secondary | ICD-10-CM | POA: Insufficient documentation

## 2022-02-05 DIAGNOSIS — Z79899 Other long term (current) drug therapy: Secondary | ICD-10-CM | POA: Diagnosis not present

## 2022-02-05 DIAGNOSIS — W19XXXA Unspecified fall, initial encounter: Secondary | ICD-10-CM | POA: Insufficient documentation

## 2022-02-05 DIAGNOSIS — I10 Essential (primary) hypertension: Secondary | ICD-10-CM | POA: Diagnosis not present

## 2022-02-05 DIAGNOSIS — S8001XA Contusion of right knee, initial encounter: Secondary | ICD-10-CM | POA: Insufficient documentation

## 2022-02-05 DIAGNOSIS — J45909 Unspecified asthma, uncomplicated: Secondary | ICD-10-CM | POA: Diagnosis not present

## 2022-02-05 DIAGNOSIS — S7001XA Contusion of right hip, initial encounter: Secondary | ICD-10-CM | POA: Diagnosis not present

## 2022-02-05 DIAGNOSIS — Z7951 Long term (current) use of inhaled steroids: Secondary | ICD-10-CM | POA: Insufficient documentation

## 2022-02-05 DIAGNOSIS — S79911A Unspecified injury of right hip, initial encounter: Secondary | ICD-10-CM | POA: Diagnosis present

## 2022-02-05 DIAGNOSIS — T07XXXA Unspecified multiple injuries, initial encounter: Secondary | ICD-10-CM

## 2022-02-05 NOTE — ED Triage Notes (Signed)
Pt. BIB [facility] from home for a fall from ground level. Pt. Was using a walker at the time of the fall. EMS notes a hole in the pt.'s wall from where she fell. Denies blood thinners, LOC and hitting head. Pt. Endorses low back pain, and right hip and shoulder pain. Pt. Has hx of 9 strokes per EMS.  Ems vs: BP: 152/92 Hr: 62 O2: 96% CBG: 119 RR: 16

## 2022-02-05 NOTE — ED Provider Notes (Signed)
[facility]ENC[facility] DEPT Provider Note: Lowella Dell, MD, FACEP  CSN: [id number] MRN: [id number] ARRIVAL: 02/05/22 at 2250 ROOM: RESB/RESB   CHIEF COMPLAINT  Fall   HISTORY OF PRESENT ILLNESS  02/05/22 11:13 PM [name] is a 76 y.o. 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  She got up to see some kind of commotion outside her window just prior to arrival.  She used her walker instead of her wheelchair and in the process fell.  She fell against the wall which produced an indentation on the wall and then she slid down onto the floor.  She is having pain in her right hip radiating to her right knee.  She told EMS it was a 10 out of 10.  She is not on anticoagulation.  She did not hit her head.  She is having lesser pain in her right shoulder and in her lower back.  These are worse with movement.   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   ASTHMA 04/16/2007   CEREBROVASCULAR ACCIDENT, HX OF 04/16/2007   CORONARY ARTERY DISEASE 08/29/2008   Environmental allergies    Hyperlipidemia    HYPERTENSION 04/16/2007   Osteoarthritis    Stroke (HCC)    URI 10/25/2009    Past Surgical History:  Procedure Laterality Date   CARDIAC CATHETERIZATION     TUBAL LIGATION      Family History  Problem Relation Age of Onset   Asthma Mother    Asthma Sister     Social History   Tobacco Use   Smoking status: Never   Smokeless tobacco: Never  Vaping Use   Vaping Use: Never used  Substance Use Topics   Alcohol use: No   Drug use: No    Prior to Admission medications   Medication Sig Start Date End Date Taking? Authorizing Provider  acetaminophen (TYLENOL) 500 MG tablet Take 500-1,000 mg by mouth 3 (three) times daily as needed for mild pain.     [provider]  albuterol (ACCUNEB) 0.63 MG/3ML nebulizer solution Take 3 mLs by nebulization 3 (three) times daily as needed for wheezing or shortness of breath.    [provider]  albuterol (PROVENTIL HFA;VENTOLIN HFA) 108 (90 BASE) MCG/ACT inhaler Inhale 2 puffs into the lungs every 6 (six) hours as needed for wheezing or shortness of breath. 03/20/15   Gordy Savers, MD  amLODipine (NORVASC) 10 MG tablet Take 1 tablet (10 mg total) by mouth daily. 03/01/15   Gordy Savers, MD  aspirin EC 81 MG tablet Take 81 mg by mouth daily.    [provider]  atorvastatin (LIPITOR) 10 MG tablet Take 10 mg by mouth daily.    [provider]  budesonide-formoterol (SYMBICORT) 160-4.5 MCG/ACT inhaler Inhale 2 puffs into the lungs 2 (two) times daily.     [provider]  carvedilol (COREG) 25 MG tablet Take 25 mg by mouth once. 08/12/19   [provider]  citalopram (CELEXA) 20 MG tablet Take 20 mg by mouth daily.    [provider]  diclofenac Sodium (VOLTAREN) 1 % GEL Apply 4 g topically 4 (four) times daily. 02/02/20   Hyatt, Max T, DPM  diphenhydrAMINE (BENADRYL) 25 MG tablet Take 1 tablet (25 mg total) by mouth every 6 (six) hours. Patient taking differently: Take 25 mg by mouth 3 (three) times daily as needed (lip swelling).  02/08/18   Derwood Kaplan, MD  EPINEPHrine 0.3  mg/0.3 mL IJ SOAJ injection Inject 0.3 mg into the muscle See admin instructions. Inject once as needed for allergic reaction. May repeat dose x1 after 5-15 minutes if needed.    [provider]  fexofenadine (ALLEGRA) 60 MG tablet Take 60 mg by mouth 2 (two) times daily.    [provider]  fluconazole (DIFLUCAN) 100 MG tablet Take 100 mg by mouth daily. 05/27/19   [provider]  fluticasone (FLONASE) 50 MCG/ACT nasal spray Place 1 spray into both nostrils daily.    [provider]  furosemide (LASIX) 20 MG tablet Take 1 tablet (20 mg total) by mouth daily. 08/31/19   Arby Barrette, MD  guaiFENesin (ROBITUSSIN) 100 MG/5ML SOLN Take 5 mLs by mouth every 4 (four) hours as needed for cough or to loosen phlegm.     [provider]  Melatonin 5 MG TABS Take 5 mg by mouth at bedtime as needed (insomnia).     [provider]  Menthol, Topical Analgesic, (BIOFREEZE EX) Apply 1 application topically 3 (three) times daily as needed (joint pain).    [provider]  methylPREDNISolone (MEDROL DOSEPAK) 4 MG TBPK tablet 6 day dose pack - take as directed 02/02/20   Hyatt, Max T, DPM  metoprolol tartrate (LOPRESSOR) 50 MG tablet Take 75 mg by mouth See admin instructions. Take 1 1/2 tablets (75mg ) twice daily    [provider]  sennosides-docusate sodium (SENOKOT-S) 8.6-50 MG tablet Take 1-2 tablets by mouth 2 (two) times daily.    [provider]  spironolactone (ALDACTONE) 25 MG tablet Take 25 mg by mouth daily.     [provider]  losartan-hydrochlorothiazide (HYZAAR) 100-25 MG per tablet Take 1 tablet by mouth daily. 11/03/11 11/14/11  11/16/11 B, FNP    Allergies Lisinopril, Zolpidem tartrate, and Eggs or egg-derived products   REVIEW OF SYSTEMS  Negative except as noted here or in the History of Present Illness.   PHYSICAL EXAMINATION  Initial Vital Signs Blood pressure (!) 151/76, pulse (!) 56, temperature 98.7 F (37.1 C), temperature source Oral, resp. rate 17, SpO2 98 %.  Examination General: Well-developed, well-nourished female in no acute distress; appearance consistent with age of record HENT: normocephalic; atraumatic Eyes: pupils equal, round and reactive to light; extraocular muscles intact Neck: supple; no C-spine tenderness Heart: regular rate and rhythm Lungs: clear to auscultation bilaterally Abdomen: soft; nondistended; nontender; bowel sounds present Extremities: No deformity; right shoulder tenderness on passive range of motion; no right hip or knee tenderness on passive range of motion; +1 edema of lower legs Neurologic: Awake, alert; left hemiparesis Skin: Warm and dry Psychiatric: Normal mood and affect   RESULTS   Summary of this visit's results, reviewed and interpreted by myself:   EKG Interpretation  Date/Time:    Ventricular Rate:    PR Interval:    QRS Duration:   QT Interval:    QTC Calculation:   R Axis:     Text Interpretation:         Laboratory Studies: No results found for this or any previous visit (from the past 24 hour(s)). Imaging Studies: DG Shoulder Right  Result Date: 02/06/2022 CLINICAL DATA:  Fall, right shoulder pain EXAM: RIGHT SHOULDER - 2+ VIEW COMPARISON:  None Available. FINDINGS: There is no evidence of fracture or dislocation. There is no evidence of arthropathy or other focal bone abnormality. Soft tissues are unremarkable. IMPRESSION: Negative. Electronically Signed   By: 02/08/2022 M.D.   On: 02/06/2022  01:28   DG Hip Unilat W or Wo Pelvis 2-3 Views Right  Result Date: 02/06/2022 CLINICAL DATA:  Fall and trauma to the right lower extremity. EXAM: DG HIP (WITH OR WITHOUT PELVIS) 2-3V RIGHT; RIGHT FEMUR 2 VIEWS COMPARISON:  None Available. FINDINGS: There is no acute fracture or dislocation. The bones are osteopenic. There is severe arthritic changes of the right knee with tricompartmental narrowing. Mild bilateral hip arthritic changes. Degenerative changes of the lower lumbar spine. The soft tissues are unremarkable. Vascular calcifications noted. IMPRESSION: 1. No acute fracture or dislocation. 2. Osteopenia and arthritic changes. Electronically Signed   By: Elgie CollardArash  Radparvar M.D.   On: 02/06/2022 00:22   DG Femur Min 2 Views Right  Result Date: 02/06/2022 CLINICAL DATA:  Fall and trauma to the right lower extremity. EXAM: DG HIP (WITH OR WITHOUT PELVIS) 2-3V RIGHT; RIGHT FEMUR 2 VIEWS COMPARISON:  None Available. FINDINGS: There is no acute fracture or dislocation. The bones are osteopenic. There is severe arthritic changes of the right knee with tricompartmental narrowing. Mild bilateral hip arthritic changes. Degenerative changes of the lower lumbar spine.  The soft tissues are unremarkable. Vascular calcifications noted. IMPRESSION: 1. No acute fracture or dislocation. 2. Osteopenia and arthritic changes. Electronically Signed   By: Elgie CollardArash  Radparvar M.D.   On: 02/06/2022 00:22   DG Lumbar Spine Complete  Result Date: 02/06/2022 CLINICAL DATA:  Fall and back pain. EXAM: LUMBAR SPINE - COMPLETE 4+ VIEW COMPARISON:  Lumbar spine radiograph dated 02/17/2019. FINDINGS: Five lumbar type vertebra. There is no acute fracture or subluxation of the lumbar spine. The bones are osteopenic. Multilevel degenerative changes with multilevel facet arthropathy. Atherosclerotic calcification of the abdominal aorta. The soft tissues are unremarkable. IMPRESSION: No acute/traumatic lumbar spine pathology. Electronically Signed   By: Elgie CollardArash  Radparvar M.D.   On: 02/06/2022 00:20    ED COURSE and MDM  Nursing notes, initial and subsequent vitals signs, including pulse oximetry, reviewed and interpreted by myself.  Vitals:   02/05/22 2300 02/06/22 0045 02/06/22 0100  BP: (!) 151/76 (!) 157/102   Pulse: (!) 56 63   Resp: 17 17 17   Temp: 98.7 F (37.1 C)    TempSrc: Oral    SpO2: 98% 94%   Weight: 93.4 kg    Height: 5\' 3"  (1.6 m)     Medications - No data to display  1:57 AM Patient able to ambulate using walker without difficulty.  No radiographic evidence of fractures were seen.  PROCEDURES  Procedures   ED DIAGNOSES     ICD-10-CM   1. Fall in home, initial encounter  W19.XXXA    Y92.009     2. Contusion of multiple sites  [id number].[place] [id number]          Sherlon Nied, MD 02/06/22 [phone]

## 2022-06-30 ENCOUNTER — Encounter (HOSPITAL_COMMUNITY): Payer: Self-pay

## 2022-06-30 ENCOUNTER — Emergency Department (HOSPITAL_COMMUNITY): Payer: Medicare (Managed Care)

## 2022-06-30 ENCOUNTER — Observation Stay (HOSPITAL_COMMUNITY)
Admission: EM | Admit: 2022-06-30 | Discharge: 2022-07-01 | Disposition: A | Discharge status: Home / Self Care | Payer: Medicare (Managed Care) | Attending: Internal Medicine | Admitting: Internal Medicine

## 2022-06-30 ENCOUNTER — Other Ambulatory Visit: Payer: Self-pay

## 2022-06-30 DIAGNOSIS — Z8679 Personal history of other diseases of the circulatory system: Secondary | ICD-10-CM

## 2022-06-30 DIAGNOSIS — I129 Hypertensive chronic kidney disease with stage 1 through stage 4 chronic kidney disease, or unspecified chronic kidney disease: Secondary | ICD-10-CM | POA: Insufficient documentation

## 2022-06-30 DIAGNOSIS — Z8673 Personal history of transient ischemic attack (TIA), and cerebral infarction without residual deficits: Secondary | ICD-10-CM | POA: Insufficient documentation

## 2022-06-30 DIAGNOSIS — I2510 Atherosclerotic heart disease of native coronary artery without angina pectoris: Secondary | ICD-10-CM | POA: Diagnosis not present

## 2022-06-30 DIAGNOSIS — J45909 Unspecified asthma, uncomplicated: Secondary | ICD-10-CM | POA: Diagnosis not present

## 2022-06-30 DIAGNOSIS — R112 Nausea with vomiting, unspecified: Secondary | ICD-10-CM | POA: Diagnosis not present

## 2022-06-30 DIAGNOSIS — R197 Diarrhea, unspecified: Secondary | ICD-10-CM | POA: Insufficient documentation

## 2022-06-30 DIAGNOSIS — Z79899 Other long term (current) drug therapy: Secondary | ICD-10-CM | POA: Insufficient documentation

## 2022-06-30 DIAGNOSIS — Z7982 Long term (current) use of aspirin: Secondary | ICD-10-CM | POA: Insufficient documentation

## 2022-06-30 DIAGNOSIS — N1832 Chronic kidney disease, stage 3b: Secondary | ICD-10-CM | POA: Diagnosis not present

## 2022-06-30 DIAGNOSIS — N179 Acute kidney failure, unspecified: Principal | ICD-10-CM | POA: Insufficient documentation

## 2022-06-30 DIAGNOSIS — N390 Urinary tract infection, site not specified: Secondary | ICD-10-CM | POA: Diagnosis present

## 2022-06-30 DIAGNOSIS — H548 Legal blindness, as defined in USA: Secondary | ICD-10-CM | POA: Diagnosis present

## 2022-06-30 DIAGNOSIS — I10 Essential (primary) hypertension: Secondary | ICD-10-CM | POA: Diagnosis present

## 2022-06-30 HISTORY — DX: Unspecified asthma, uncomplicated: J45.909

## 2022-06-30 LAB — URINALYSIS, ROUTINE W REFLEX MICROSCOPIC
Bilirubin Urine: NEGATIVE
Glucose, UA: NEGATIVE mg/dL
Hgb urine dipstick: NEGATIVE
Ketones, ur: NEGATIVE mg/dL
Nitrite: NEGATIVE
Protein, ur: NEGATIVE mg/dL
Specific Gravity, Urine: 1.01 (ref 1.005–1.030)
pH: 5 (ref 5.0–8.0)

## 2022-06-30 LAB — CBC WITH DIFFERENTIAL/PLATELET
Abs Immature Granulocytes: 0.02 10*3/uL (ref 0.00–0.07)
Basophils Absolute: 0.1 10*3/uL (ref 0.0–0.1)
Basophils Relative: 1 %
Eosinophils Absolute: 0.4 10*3/uL (ref 0.0–0.5)
Eosinophils Relative: 6 %
HCT: 36.5 % (ref 36.0–46.0)
Hemoglobin: 11.2 g/dL — ABNORMAL LOW (ref 12.0–15.0)
Immature Granulocytes: 0 %
Lymphocytes Relative: 21 %
Lymphs Abs: 1.3 10*3/uL (ref 0.7–4.0)
MCH: 27.1 pg (ref 26.0–34.0)
MCHC: 30.7 g/dL (ref 30.0–36.0)
MCV: 88.4 fL (ref 80.0–100.0)
Monocytes Absolute: 0.4 10*3/uL (ref 0.1–1.0)
Monocytes Relative: 7 %
Neutro Abs: 4.1 10*3/uL (ref 1.7–7.7)
Neutrophils Relative %: 65 %
Platelets: 186 10*3/uL (ref 150–400)
RBC: 4.13 MIL/uL (ref 3.87–5.11)
RDW: 15.5 % (ref 11.5–15.5)
WBC: 6.3 10*3/uL (ref 4.0–10.5)
nRBC: 0 % (ref 0.0–0.2)

## 2022-06-30 LAB — COMPREHENSIVE METABOLIC PANEL
ALT: 11 U/L (ref 0–44)
AST: 18 U/L (ref 15–41)
Albumin: 4.4 g/dL (ref 3.5–5.0)
Alkaline Phosphatase: 101 U/L (ref 38–126)
Anion gap: 8 (ref 5–15)
BUN: 36 mg/dL — ABNORMAL HIGH (ref 8–23)
CO2: 30 mmol/L (ref 22–32)
Calcium: 9.8 mg/dL (ref 8.9–10.3)
Chloride: 102 mmol/L (ref 98–111)
Creatinine, Ser: 2.08 mg/dL — ABNORMAL HIGH (ref 0.44–1.00)
GFR, Estimated: 25 mL/min — ABNORMAL LOW (ref >60)
Glucose, Bld: 86 mg/dL (ref 70–99)
Potassium: 4.4 mmol/L (ref 3.5–5.1)
Sodium: 140 mmol/L (ref 135–145)
Total Bilirubin: 0.8 mg/dL (ref 0.3–1.2)
Total Protein: 7.7 g/dL (ref 6.5–8.1)

## 2022-06-30 LAB — LIPASE, BLOOD: Lipase: 44 U/L (ref 11–51)

## 2022-06-30 MED ORDER — HEPARIN SODIUM (PORCINE) 5000 UNIT/ML IJ SOLN
5000.0000 [IU] | Freq: Three times a day (TID) | INTRAMUSCULAR | Status: DC
  Administered 2022-06-30 – 2022-07-01 (×3): 5000 [IU] via SUBCUTANEOUS
  Filled 2022-06-30 (×2): qty 1

## 2022-06-30 MED ORDER — ATORVASTATIN CALCIUM 10 MG PO TABS
10.0000 mg | ORAL_TABLET | Freq: Every day | ORAL | Status: DC
  Administered 2022-07-01: 10 mg via ORAL
  Filled 2022-06-30: qty 1

## 2022-06-30 MED ORDER — ORAL CARE MOUTH RINSE: 15.0000 mL | OROMUCOSAL | Status: DC | PRN

## 2022-06-30 MED ORDER — SODIUM CHLORIDE 0.9 % IV SOLN: INTRAVENOUS | Status: DC

## 2022-06-30 MED ORDER — ACETAMINOPHEN 650 MG RE SUPP: 650.0000 mg | Freq: Four times a day (QID) | RECTAL | Status: DC | PRN

## 2022-06-30 MED ORDER — FLUTICASONE PROPIONATE 50 MCG/ACT NA SUSP
1.0000 | Freq: Every day | NASAL | Status: DC
  Administered 2022-07-01: 1 via NASAL
  Filled 2022-06-30: qty 16

## 2022-06-30 MED ORDER — MOMETASONE FURO-FORMOTEROL FUM 200-5 MCG/ACT IN AERO
2.0000 | INHALATION_SPRAY | Freq: Two times a day (BID) | RESPIRATORY_TRACT | Status: DC
  Administered 2022-06-30 – 2022-07-01 (×2): 2 via RESPIRATORY_TRACT
  Filled 2022-06-30: qty 8.8

## 2022-06-30 MED ORDER — ENOXAPARIN SODIUM 30 MG/0.3ML IJ SOSY: 30.0000 mg | PREFILLED_SYRINGE | INTRAMUSCULAR | Status: DC

## 2022-06-30 MED ORDER — ASPIRIN 81 MG PO TBEC
81.0000 mg | DELAYED_RELEASE_TABLET | Freq: Every day | ORAL | Status: DC
  Administered 2022-07-01: 81 mg via ORAL
  Filled 2022-06-30: qty 1

## 2022-06-30 MED ORDER — ALBUTEROL SULFATE (2.5 MG/3ML) 0.083% IN NEBU: 3.0000 mL | INHALATION_SOLUTION | Freq: Three times a day (TID) | RESPIRATORY_TRACT | Status: DC | PRN

## 2022-06-30 MED ORDER — ACETAMINOPHEN 325 MG PO TABS: 650.0000 mg | ORAL_TABLET | Freq: Four times a day (QID) | ORAL | Status: DC | PRN

## 2022-06-30 MED ORDER — ONDANSETRON HCL 4 MG/2ML IJ SOLN
4.0000 mg | Freq: Once | INTRAMUSCULAR | Status: AC
  Administered 2022-06-30: 4 mg via INTRAVENOUS
  Filled 2022-06-30: qty 2

## 2022-06-30 MED ORDER — LACTATED RINGERS IV SOLN: INTRAVENOUS | Status: DC

## 2022-06-30 MED ORDER — CITALOPRAM HYDROBROMIDE 10 MG PO TABS
20.0000 mg | ORAL_TABLET | Freq: Every day | ORAL | Status: DC
  Administered 2022-07-01: 20 mg via ORAL
  Filled 2022-06-30: qty 2

## 2022-06-30 NOTE — Hospital Course (Addendum)
76 year old female with history of renal failure hypertension legally blind, hyperlipidemia, CKD 3B baseline creatinine 1.5, depression, albinism with macular degeneration, CAD, currently a PACE patient comes to the ED for evaluation of nausea and vomiting x1.  She has been having diarrhea for a month.  Today she had nonbilious vomiting no fever chills. Granddaughter at bedside. Patient lives with her daughter. Patient reports she is having diarrhea for a month but as per family it has been going for a day that she is having nausea or vomiting Patient otherwise denies any chest pain, shortness of breath, fever, chills, headache, focal weakness, numbness tingling, speech difficulties.   She was seen in the ED afebrile heart rate 56 respirations 13 BP 138/70, saturating 98% on room air Labs with fairly stable CBC BMP except for elevated creatinine UA abnormal with bacteria many leukocytes small WBC 11-20-but without urinary symptoms.CT abdomen pelvis without contrast mild bilateral fat-containing inguinal hernias no acute finding Admission requested for AKI Placed on IV fluids overnight , Remained afebrile creatinine improved to baseline 1.5.  Tolerating soft diet. No BM to check stool sample.

## 2022-06-30 NOTE — ED Triage Notes (Signed)
Patient brought in by EMS from home for NVD. Family reports history of left side deficit. Exertion and wheezing on EMS truck. 5mg  albuterol and .5mg  atrovent administered by EMS. 20G RAC.   BP146/60 HR 60 CBG 135  SPO2 99%  A&O X4

## 2022-06-30 NOTE — ED Provider Notes (Signed)
[facility] DEPT Provider Note   CSN: [id number] Arrival date & time: 06/30/22  1001     History  Chief Complaint  Patient presents with   Nausea   Emesis   Diarrhea    [name] is a 76 y.o. female.  76 year old patient presents with abdominal pain and emesis x1.  Abdominal pain is diffuse and crampy and started yesterday.  Has been having watery diarrhea for about a month now.  Supposed be seen by a physician for this but has not followed up.  Stated that today she had nonbilious emesis.  No associated fever or chills.  Does have a history of asthma and did have some wheezing MSK the patient does albuterol.  Denies any urinary symptoms       Home Medications Prior to Admission medications   Medication Sig Start Date End Date Taking? Authorizing Provider  acetaminophen (TYLENOL) 500 MG tablet Take 500-1,000 mg by mouth 3 (three) times daily as needed for mild pain.     [provider]  albuterol (ACCUNEB) 0.63 MG/3ML nebulizer solution Take 3 mLs by nebulization 3 (three) times daily as needed for wheezing or shortness of breath.    [provider]  albuterol (PROVENTIL HFA;VENTOLIN HFA) 108 (90 BASE) MCG/ACT inhaler Inhale 2 puffs into the lungs every 6 (six) hours as needed for wheezing or shortness of breath. 03/20/15   Marletta Lor, MD  amLODipine (NORVASC) 10 MG tablet Take 1 tablet (10 mg total) by mouth daily. 03/01/15   Marletta Lor, MD  aspirin EC 81 MG tablet Take 81 mg by mouth daily.    [provider]  atorvastatin (LIPITOR) 10 MG tablet Take 10 mg by mouth daily.    [provider]  budesonide-formoterol (SYMBICORT) 160-4.5 MCG/ACT inhaler Inhale 2 puffs into the lungs 2 (two) times daily.     [provider]  carvedilol (COREG) 25 MG tablet Take 25 mg by mouth once. 08/12/19   [provider]  citalopram (CELEXA) 20 MG tablet Take 20 mg by mouth daily.     [provider]  diclofenac Sodium (VOLTAREN) 1 % GEL Apply 4 g topically 4 (four) times daily. 02/02/20   Hyatt, Max T, DPM  diphenhydrAMINE (BENADRYL) 25 MG tablet Take 1 tablet (25 mg total) by mouth every 6 (six) hours. Patient taking differently: Take 25 mg by mouth 3 (three) times daily as needed (lip swelling).  02/08/18   Varney Biles, MD  EPINEPHrine 0.3 mg/0.3 mL IJ SOAJ injection Inject 0.3 mg into the muscle See admin instructions. Inject once as needed for allergic reaction. May repeat dose x1 after 5-15 minutes if needed.    [provider]  fexofenadine (ALLEGRA) 60 MG tablet Take 60 mg by mouth 2 (two) times daily.    [provider]  fluconazole (DIFLUCAN) 100 MG tablet Take 100 mg by mouth daily. 05/27/19   [provider]  fluticasone (FLONASE) 50 MCG/ACT nasal spray Place 1 spray into both nostrils daily.    [provider]  furosemide (LASIX) 20 MG tablet Take 1 tablet (20 mg total) by mouth daily. 08/31/19   Charlesetta Shanks, MD  guaiFENesin (ROBITUSSIN) 100 MG/5ML SOLN Take 5 mLs by mouth every 4 (four) hours as needed for cough or to loosen phlegm.    [provider]  Melatonin 5 MG TABS Take 5 mg by mouth at bedtime as needed (insomnia).     [provider]  Menthol, Topical  Analgesic, (BIOFREEZE EX) Apply 1 application topically 3 (three) times daily as needed (joint pain).    [provider]  methylPREDNISolone (MEDROL DOSEPAK) 4 MG TBPK tablet 6 day dose pack - take as directed 02/02/20   Hyatt, Max T, DPM  metoprolol tartrate (LOPRESSOR) 50 MG tablet Take 75 mg by mouth See admin instructions. Take 1 1/2 tablets (75mg ) twice daily    [provider]  sennosides-docusate sodium (SENOKOT-S) 8.6-50 MG tablet Take 1-2 tablets by mouth 2 (two) times daily.    [provider]  spironolactone (ALDACTONE) 25 MG tablet Take 25 mg by mouth daily.     [provider]   losartan-hydrochlorothiazide (HYZAAR) 100-25 MG per tablet Take 1 tablet by mouth daily. 11/03/11 11/14/11  11/16/11 B, FNP      Allergies    Lisinopril, Zolpidem tartrate, and Eggs or egg-derived products    Review of Systems   Review of Systems  All other systems reviewed and are negative.   Physical Exam Updated Vital Signs BP 138/70   Pulse (!) 56   Temp 98.9 F (37.2 C) (Oral)   Resp 13   Ht 1.6 m (5\' 3" )   Wt 87.5 kg   SpO2 99%   BMI 34.19 kg/m  Physical Exam Vitals and nursing note reviewed.  Constitutional:      General: She is not in acute distress.    Appearance: Normal appearance. She is well-developed. She is not toxic-appearing.  HENT:     Head: Normocephalic and atraumatic.  Eyes:     General: Lids are normal.     Conjunctiva/sclera: Conjunctivae normal.     Pupils: Pupils are equal, round, and reactive to light.  Neck:     Thyroid: No thyroid mass.     Trachea: No tracheal deviation.  Cardiovascular:     Rate and Rhythm: Normal rate and regular rhythm.     Heart sounds: Normal heart sounds. No murmur heard.    No gallop.  Pulmonary:     Effort: Pulmonary effort is normal. No respiratory distress.     Breath sounds: Normal breath sounds. No stridor. No decreased breath sounds, wheezing, rhonchi or rales.  Abdominal:     General: There is no distension.     Palpations: Abdomen is soft.     Tenderness: There is generalized abdominal tenderness. There is no guarding or rebound.  Musculoskeletal:        General: No tenderness. Normal range of motion.     Cervical back: Normal range of motion and neck supple.  Skin:    General: Skin is warm and dry.     Findings: No abrasion or rash.  Neurological:     Mental Status: She is alert and oriented to person, place, and time. Mental status is at baseline.     GCS: GCS eye subscore is 4. GCS verbal subscore is 5. GCS motor subscore is 6.     Cranial Nerves: No cranial nerve deficit.     Sensory: No  sensory deficit.     Motor: Motor function is intact.  Psychiatric:        Attention and Perception: Attention normal.        Speech: Speech normal.        Behavior: Behavior normal.     ED Results / Procedures / Treatments   Labs (all labs ordered are listed, but only abnormal results are displayed) Labs Reviewed  CBC WITH DIFFERENTIAL/PLATELET  COMPREHENSIVE METABOLIC PANEL  LIPASE, BLOOD  URINALYSIS, ROUTINE W REFLEX MICROSCOPIC    EKG None  Radiology No results found.  Procedures Procedures    Medications Ordered in ED Medications  lactated ringers infusion (has no administration in time range)  ondansetron (ZOFRAN) injection 4 mg (has no administration in time range)    ED Course/ Medical Decision Making/ A&P                           Medical Decision Making Amount and/or Complexity of Data Reviewed Labs: ordered. Radiology: ordered.  Risk Prescription drug management.   Presented with abdominal cramping as well as worsening diarrhea x1 month.  Also had emesis x1.  Concern for possible abdominal pathology such as obstruction.  Abdominal CT per my interpretation showed no acute findings.  Does have evidence of acute kidney injury with creatinine above 2.  Given IV fluids here.  Patient with ongoing diarrhea for a month and concern for possible infectious etiology.  Stool cultures ordered including C. difficile.  No apparent risk factors for C. difficile colitis.  Patient will require admission for IV hydration        Final Clinical Impression(s) / ED Diagnoses Final diagnoses:  None    Rx / DC Orders ED Discharge Orders     None         Lorre Nick, MD 06/30/22 1411

## 2022-06-30 NOTE — H&P (Signed)
History and Physical    [name] [id number] DOB: [birth year] DOA: 06/30/2022  PCP: [facility] [place]   Patient coming from:   Chief Complaint  Patient presents with   Nausea   Emesis   Diarrhea      HPI: 76 year old female with history of renal failure hypertension legally blind, hyperlipidemia, CKD 3B baseline creatinine 1.5, depression, albinism with macular degeneration, CAD, currently a [facility] patient comes to the ED for evaluation of nausea and vomiting x1.  She has been having diarrhea for a month.  Today she had nonbilious vomiting no fever chills. Granddaughter at bedside. Patient lives with her daughter. Patient reports she is having diarrhea for a month but as per family it has been going for a day that she is having nausea or vomiting Patient otherwise denies any chest pain, shortness of breath, fever, chills, headache, focal weakness, numbness tingling, speech difficulties.    She was seen in the ED afebrile heart rate 56 respirations 13 BP 138/70, saturating 98% on room air Labs with fairly stable CBC BMP except for elevated creatinine UA abnormal with bacteria many leukocytes small WBC 11-20 Given IV fluid hydration, GI panel ordered CT abdomen pelvis without contrast mild bilateral fat-containing inguinal hernias no acute finding Admission requested for AKI   Assessment/Plan Principal Problem:   Acute renal failure superimposed on stage 3b chronic kidney disease (HCC) Active Problems:   Essential hypertension   History of cardiovascular disorder   Legally blind   UTI (urinary tract infection)   Diarrhea   Nausea and vomiting  AKI on CKD stage IIIb baseline creatinine around 1.5: In the setting of diarrhea nausea vomiting.  CT scan no acute finding.  Given gentle IV fluid hydration repeat BMP in AM.  Avoid nephrotoxic medication, hold home Lasix/Aldactone  Diarrhea x1 month? Nausea and vomiting: Per family episode just for  1 day, patient is hungry wants to eat, starting full liquid diet, ADHD.  PPI antiemetics.  Check C. difficile and GI panel.   Asthma resume home inhaler, as needed albuterol  Hypertension History of CAD/CVA HLD: BP currently well controlled hold home meds in setting of AKI.  Resume statin aspirin.  legally blind-supportive care Depression-resume home meds Albinism with macular degeneration: Supportive care.    Med rec- pending  Body mass index is 34.19 kg/m.   Severity of Illness: The appropriate patient status for this patient is OBSERVATION. Observation status is judged to be reasonable and necessary in order to provide the required intensity of service to ensure the patient's safety. The patient's presenting symptoms, physical exam findings, and initial radiographic and laboratory data in the context of their medical condition is felt to place them at decreased risk for further clinical deterioration. Furthermore, it is anticipated that the patient will be medically stable for discharge from the hospital within 2 midnights of admission.    DVT prophylaxis: heparin injection 5,000 Units Start: 06/30/22 2200 Code Status:   Code Status: Full Code -discussed with patient's daughter over the phone and granddaughter at the bedside and the patient Family Communication: Admission, patients condition and plan of care including tests being ordered have been discussed with the patient and granddaughter at bedside, daughter over the phone who indicate understanding and agree with the plan and Code Status.  Consults called:  none  Review of Systems: All systems were reviewed and were negative except as mentioned in HPI above. 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 She has never used smokeless tobacco. She reports that she does not drink alcohol and does not use drugs.  Allergies  Allergen Reactions   Lisinopril Swelling   Zolpidem Tartrate     Other reaction(s): Agitation   Eggs Or Egg-Derived Products     Upset stomach     Family History  Problem Relation Age of Onset   Asthma Mother    Asthma Sister      Prior to Admission medications   Medication Sig Start Date End Date Taking? Authorizing Provider  acetaminophen (TYLENOL) 500 MG tablet Take 500-1,000 mg by mouth 3 (three) times daily as needed for mild pain.     [provider]  albuterol (ACCUNEB) 0.63 MG/3ML nebulizer solution Take 3 mLs by nebulization 3 (three) times daily as needed for wheezing or shortness of breath.    [provider]  albuterol (PROVENTIL HFA;VENTOLIN HFA) 108 (90 BASE) MCG/ACT inhaler Inhale 2 puffs into the lungs every 6 (six) hours as needed for wheezing or shortness of breath. 03/20/15   Marletta Lor, MD  amLODipine (NORVASC) 10 MG tablet Take 1 tablet (10 mg total) by mouth daily. 03/01/15   Marletta Lor, MD  aspirin EC 81 MG tablet Take 81 mg by mouth daily.    [provider]  atorvastatin (LIPITOR) 10 MG tablet Take 10 mg by mouth daily.    [provider]  budesonide-formoterol (SYMBICORT) 160-4.5 MCG/ACT inhaler Inhale 2 puffs into the lungs 2 (two) times daily.     [provider]  carvedilol (COREG) 25 MG tablet Take 25 mg by mouth once. 08/12/19   [provider]  citalopram (CELEXA) 20 MG tablet Take 20 mg by mouth daily.    [provider]  diclofenac Sodium (VOLTAREN) 1 % GEL Apply 4 g topically 4 (four) times  daily. 02/02/20   Hyatt, Max T, DPM  diphenhydrAMINE (BENADRYL) 25 MG tablet Take 1 tablet (25 mg total) by mouth every 6 (six) hours. Patient taking differently: Take 25 mg by mouth 3 (three) times daily as needed (lip swelling).  02/08/18   Varney Biles, MD  EPINEPHrine 0.3 mg/0.3 mL IJ SOAJ injection Inject 0.3 mg into the muscle See admin instructions. Inject once as needed for allergic reaction. May repeat dose x1 after 5-15 minutes if needed.    [provider]  fexofenadine (ALLEGRA) 60 MG tablet Take 60 mg by mouth 2 (two) times daily.    [provider]  fluconazole (DIFLUCAN) 100 MG tablet Take 100 mg by mouth daily. 05/27/19   [provider]  fluticasone (FLONASE) 50 MCG/ACT nasal spray Place 1 spray into both nostrils daily.    [provider]  furosemide (LASIX) 20 MG tablet Take 1 tablet (20 mg total) by mouth daily. 08/31/19   Charlesetta Shanks, MD  guaiFENesin (ROBITUSSIN) 100 MG/5ML SOLN Take 5 mLs by mouth every 4 (four) hours as needed for cough or to loosen phlegm.  [provider]  Melatonin 5 MG TABS Take 5 mg by mouth at bedtime as needed (insomnia).     [provider]  Menthol, Topical Analgesic, (BIOFREEZE EX) Apply 1 application topically 3 (three) times daily as needed (joint pain).    [provider]  methylPREDNISolone (MEDROL DOSEPAK) 4 MG TBPK tablet 6 day dose pack - take as directed 02/02/20   Hyatt, Max T, DPM  metoprolol tartrate (LOPRESSOR) 50 MG tablet Take 75 mg by mouth See admin instructions. Take 1 1/2 tablets (75mg ) twice daily    [provider]  sennosides-docusate sodium (SENOKOT-S) 8.6-50 MG tablet Take 1-2 tablets by mouth 2 (two) times daily.    [provider]  spironolactone (ALDACTONE) 25 MG tablet Take 25 mg by mouth daily.     [provider]  losartan-hydrochlorothiazide (HYZAAR) 100-25 MG per tablet Take 1 tablet by mouth daily. 11/03/11 11/14/11  Kennyth Arnold, FNP    Physical Exam: Vitals:   06/30/22 1200 06/30/22 1320 06/30/22 1415 06/30/22 1436  BP: (!) 140/66 131/64 (!) 134/59   Pulse: 69 66 66   Resp: 12 14 14    Temp:    98.4 F (36.9 C)  TempSrc:    Oral  SpO2: 96% 93% 96%   Weight:      Height:       General exam: AAO at baseline,on RA,weak appearing. HEENT:Oral mucosa moist, Ear/Nose WNL grossly, dentition normal. Respiratory system: bilaterally clear,no wheezing or crackles,no use of accessory muscle Cardiovascular system: S1 & S2 +, No JVD,. Gastrointestinal system: Abdomen soft, generalized tenderness +,ND, BS+ Nervous System:Alert, awake, moving extremities and grossly nonfocal Extremities: improving and chronic appearing leg edema, distal peripheral pulses palpable.  Skin: No rashes,no icterus. MSK: Normal muscle bulk,tone, power   Labs on Admission: I have personally reviewed following labs and imaging studies  CBC: Recent Labs  Lab 06/30/22 1050  WBC 6.3  NEUTROABS 4.1  HGB 11.2*  HCT 36.5  MCV 88.4  PLT 970   Basic Metabolic Panel: Recent Labs  Lab 06/30/22 1050  NA 140  K 4.4  CL 102  CO2 30  GLUCOSE 86  BUN 36*  CREATININE 2.08*  CALCIUM 9.8   GFR: Estimated Creatinine Clearance: 24.9 mL/min (A) (by C-G formula based on SCr of 2.08 mg/dL (H)). Liver Function Tests: Recent Labs  Lab 06/30/22 1050  AST 18  ALT 11  ALKPHOS 101  BILITOT 0.8  PROT 7.7  ALBUMIN 4.4   Recent Labs  Lab 06/30/22 1050  LIPASE 44  Urine analysis:    Component Value Date/Time   COLORURINE YELLOW 06/30/2022 1223   APPEARANCEUR HAZY (A) 06/30/2022 1223   LABSPEC 1.010 06/30/2022 1223   PHURINE 5.0 06/30/2022 1223   GLUCOSEU NEGATIVE 06/30/2022 1223   HGBUR NEGATIVE 06/30/2022 1223   BILIRUBINUR NEGATIVE 06/30/2022 1223   BILIRUBINUR negative 03/01/2015 1623   KETONESUR NEGATIVE 06/30/2022 1223   PROTEINUR NEGATIVE 06/30/2022 1223   UROBILINOGEN 1.0 03/01/2015 1623   UROBILINOGEN 1.0 10/25/2013 2053    NITRITE NEGATIVE 06/30/2022 1223   LEUKOCYTESUR SMALL (A) 06/30/2022 1223    Radiological Exams on Admission: CT ABDOMEN PELVIS WO CONTRAST  Result Date: 06/30/2022 CLINICAL DATA:  Acute generalized abdominal pain. EXAM: CT ABDOMEN AND PELVIS WITHOUT CONTRAST TECHNIQUE: Multidetector CT imaging of the abdomen and pelvis was performed following the standard protocol without IV contrast. RADIATION DOSE REDUCTION: This exam was performed according to the departmental dose-optimization program which includes automated exposure control, adjustment of  the mA and/or kV according to patient size and/or use of iterative reconstruction technique. COMPARISON:  June 05, 2019. FINDINGS: Lower chest: No acute abnormality. Hepatobiliary: No focal liver abnormality is seen. No gallstones, gallbladder wall thickening, or biliary dilatation. Pancreas: Unremarkable. No pancreatic ductal dilatation or surrounding inflammatory changes. Spleen: Normal in size without focal abnormality. Adrenals/Urinary Tract: Adrenal glands are unremarkable. Kidneys are normal, without renal calculi, focal lesion, or hydronephrosis. Bladder is unremarkable. Stomach/Bowel: Stomach is within normal limits. Appendix appears normal. No evidence of bowel wall thickening, distention, or inflammatory changes. Vascular/Lymphatic: Aortic atherosclerosis. No enlarged abdominal or pelvic lymph nodes. Reproductive: Status post hysterectomy. No adnexal masses. Other: Mild bilateral fat containing inguinal hernias are noted. No ascites is noted. Musculoskeletal: No acute or significant osseous findings. IMPRESSION: Mild bilateral fat containing inguinal hernias. No acute abnormality seen in the abdomen or pelvis. Aortic Atherosclerosis (ICD10-I70.0). Electronically Signed   By: Marijo Conception M.D.   On: 06/30/2022 12:54   Antonieta Pert MD [facility]  If 7PM-7AM, please contact night-coverage [email or web address]  06/30/2022, 3:38 PM

## 2022-07-01 ENCOUNTER — Encounter (HOSPITAL_COMMUNITY): Payer: Self-pay | Admitting: Internal Medicine

## 2022-07-01 DIAGNOSIS — N179 Acute kidney failure, unspecified: Secondary | ICD-10-CM | POA: Diagnosis not present

## 2022-07-01 LAB — CBC
HCT: 34.3 % — ABNORMAL LOW (ref 36.0–46.0)
Hemoglobin: 10.5 g/dL — ABNORMAL LOW (ref 12.0–15.0)
MCH: 27.2 pg (ref 26.0–34.0)
MCHC: 30.6 g/dL (ref 30.0–36.0)
MCV: 88.9 fL (ref 80.0–100.0)
Platelets: 184 10*3/uL (ref 150–400)
RBC: 3.86 MIL/uL — ABNORMAL LOW (ref 3.87–5.11)
RDW: 15.7 % — ABNORMAL HIGH (ref 11.5–15.5)
WBC: 4.9 10*3/uL (ref 4.0–10.5)
nRBC: 0 % (ref 0.0–0.2)

## 2022-07-01 LAB — COMPREHENSIVE METABOLIC PANEL
ALT: 10 U/L (ref 0–44)
AST: 16 U/L (ref 15–41)
Albumin: 3.5 g/dL (ref 3.5–5.0)
Alkaline Phosphatase: 84 U/L (ref 38–126)
Anion gap: 6 (ref 5–15)
BUN: 25 mg/dL — ABNORMAL HIGH (ref 8–23)
CO2: 30 mmol/L (ref 22–32)
Calcium: 9.3 mg/dL (ref 8.9–10.3)
Chloride: 104 mmol/L (ref 98–111)
Creatinine, Ser: 1.55 mg/dL — ABNORMAL HIGH (ref 0.44–1.00)
GFR, Estimated: 35 mL/min — ABNORMAL LOW (ref >60)
Glucose, Bld: 95 mg/dL (ref 70–99)
Potassium: 3.9 mmol/L (ref 3.5–5.1)
Sodium: 140 mmol/L (ref 135–145)
Total Bilirubin: 0.5 mg/dL (ref 0.3–1.2)
Total Protein: 6.7 g/dL (ref 6.5–8.1)

## 2022-07-01 LAB — URINE CULTURE: Culture: NO GROWTH

## 2022-07-01 NOTE — Progress Notes (Signed)
Discharged to home, AVS reviewed with the patient and her grand-daughter. Elmyra Ricks, RN from [facility] called and confirmed they will set up any needs at home. [facility] provided transportation.

## 2022-07-01 NOTE — Progress Notes (Signed)
  Transition of Care [facility]) Screening Note   Patient Details  Name: [name] Date of Birth: 1945/10/25   Transition of Care [facility]) CM/SW Contact:    Vassie Moselle, [place] Phone Number: 07/01/2022, 10:25 AM    Transition of Care Department [facility]) has reviewed patient and no TOC needs have been identified at this time. We will continue to monitor patient advancement through interdisciplinary progression rounds. If new patient transition needs arise, please place a TOC consult.

## 2022-07-01 NOTE — Discharge Summary (Signed)
Physician Discharge Summary  [name] GEX:[id number] DOB: 01-24-1946 DOA: 06/30/2022  PCP[facility]  Admit date: 06/30/2022 Discharge date: 07/01/2022 Recommendations for Outpatient Follow-up:  Follow up with PCP in 1 weeks-call for appointment Please obtain BMP/CBC in one week  Discharge Dispo: Home Discharge Condition: Stable Code Status:   Code Status: Full Code Diet recommendation:  Diet Order             DIET DYS 3 Room service appropriate? Yes; Fluid consistency: Thin  Diet effective now                    Brief/Interim Summary: 76 year old female with history of renal failure hypertension legally blind, hyperlipidemia, CKD 3B baseline creatinine 1.5, depression, albinism with macular degeneration, CAD, currently a [facility] patient comes to the ED for evaluation of nausea and vomiting x1.  She has been having diarrhea for a month.  Today she had nonbilious vomiting no fever chills. Granddaughter at bedside. Patient lives with her daughter. Patient reports she is having diarrhea for a month but as per family it has been going for a day that she is having nausea or vomiting Patient otherwise denies any chest pain, shortness of breath, fever, chills, headache, focal weakness, numbness tingling, speech difficulties.   She was seen in the ED afebrile heart rate 56 respirations 13 BP 138/70, saturating 98% on room air Labs with fairly stable CBC BMP except for elevated creatinine UA abnormal with bacteria many leukocytes small WBC 11-20-but without urinary symptoms.CT abdomen pelvis without contrast mild bilateral fat-containing inguinal hernias no acute finding Admission requested for AKI Placed on IV fluids overnight , Remained afebrile creatinine improved to baseline 1.5.  Tolerating soft diet. No BM to check stool sample.     Discharge Diagnoses:  Principal Problem:   Acute renal failure superimposed on stage 3b chronic kidney disease  (HCC) Active Problems:   Essential hypertension   History of cardiovascular disorder   Legally blind   UTI (urinary tract infection)   Diarrhea   Nausea and vomiting  AKI on CKD stage IIIb baseline creatinine around 1.5: In the setting of diarrhea nausea vomitingx1 day-improved with IV fluids overnight tolerating diet, she feels ready for discharge home today.  Resume Aldactone but hold antihypertensives and diuretics until seen by [facility] clinic tomorrow I discussed with the nurse today   Diarrhea x1 month? Nausea and vomiting: Per family episode just for 1 day, patient has had no recurrence tolerating p.o. well   Asthma not in exacerbation continue home inhaler, as needed albuterol   Hypertension History of CAD/CVA HLD: BP currently well controlled  w/o meds> unclear if she is taking amlodipine or Coreg at home - called [facility] clinic today - will ask to hold them and they will see her tomorrow .  Not on Lasix anymore, she may be on torsemide per the [facility] record and they will assess tomorrow.  Cont aldactone.   Abnormal UA w/ pyuria-patient denies dysuria or urinary symptoms no fever no suprapubic tenderness.  Follow-up with the clinic-if asymptomatic consider antibiotics legally blind-supportive care Depression-resume home meds Albinism with macular degeneration: Supportive care.   Consults: None Subjective: Alert awake July Resting comfortably no nausea vomiting diarrhea tolerating well, wants to go home today , granddaughter at the bedside and feel ready for discharge.  Discharge Exam: Vitals:   07/01/22 0147 07/01/22 0552  BP: 123/70 (!) 134/49  Pulse: 62 (!) 59  Resp: 20 18  Temp: 98.6 F (37 C) 98.5 F (36.9 C)  SpO2: 95% 94%   General: Pt is alert, awake, not in acute distress Cardiovascular: RRR, S1/S2 +, no rubs, no gallops Respiratory: CTA bilaterally, no wheezing, no rhonchi Abdominal: Soft, NT, ND, bowel sounds + Extremities: no edema, no  cyanosis  Discharge Instructions  Discharge Instructions     Discharge instructions   Complete by: As directed    Please follow-up with [facility] tomorrow I have spoken to them and do not take your Coreg and amlodipine until your blood pressure is checked and further determined by your doctor at the clinic tomorrow.  Please call call MD or return to ER for similar or worsening recurring problem that brought you to hospital or if any fever,nausea/vomiting,abdominal pain, uncontrolled pain, chest pain,  shortness of breath or any other alarming symptoms.  Please follow-up your doctor as instructed in a week time and call the office for appointment.  Please avoid alcohol, smoking, or any other illicit substance and maintain healthy habits including taking your regular medications as prescribed.  You were cared for by a hospitalist during your hospital stay. If you have any questions about your discharge medications or the care you received while you were in the hospital after you are discharged, you can call the unit and ask to speak with the hospitalist on call if the hospitalist that took care of you is not available.  Once you are discharged, your primary care physician will handle any further medical issues. Please note that NO REFILLS for any discharge medications will be authorized once you are discharged, as it is imperative that you return to your primary care physician (or establish a relationship with a primary care physician if you do not have one) for your aftercare needs so that they can reassess your need for medications and monitor your lab values   Increase activity slowly   Complete by: As directed       Allergies as of 07/01/2022       Reactions   Lisinopril Swelling   Zolpidem Tartrate    Other reaction(s): Agitation   Eggs Or Egg-derived Products    Upset stomach    Other    Lactose intolerant        Medication List     STOP taking these medications     amLODipine 10 MG tablet Commonly known as: NORVASC   carvedilol 25 MG tablet Commonly known as: COREG       TAKE these medications    albuterol 0.63 MG/3ML nebulizer solution Commonly known as: ACCUNEB Take 3 mLs by nebulization 3 (three) times daily as needed for wheezing or shortness of breath.   albuterol 108 (90 Base) MCG/ACT inhaler Commonly known as: VENTOLIN HFA Inhale 2 puffs into the lungs every 6 (six) hours as needed for wheezing or shortness of breath.   aspirin EC 81 MG tablet Take 81 mg by mouth daily.   atorvastatin 10 MG tablet Commonly known as: LIPITOR Take 10 mg by mouth daily.   budesonide-formoterol 160-4.5 MCG/ACT inhaler Commonly known as: SYMBICORT Inhale 2 puffs into the lungs 2 (two) times daily.   citalopram 20 MG tablet Commonly known as: CELEXA Take 20 mg by mouth daily.   diclofenac Sodium 1 % Gel Commonly known as: VOLTAREN Apply 4 g topically 4 (four) times daily.   diphenhydrAMINE 25 MG tablet Commonly known as: BENADRYL Take 1 tablet (25 mg total) by mouth every 6 (six) hours. What changed:  when to take this reasons to take this   EPINEPHrine 0.3 mg/0.3 mL Soaj injection Commonly known as: EPI-PEN Inject 0.3 mg into the muscle See admin instructions. Inject once as needed for allergic reaction. May repeat dose x1 after 5-15 minutes if needed.   fluticasone 50 MCG/ACT nasal spray Commonly known as: FLONASE Place 1 spray into both nostrils daily.   guaiFENesin 100 MG/5ML Soln Commonly known as: ROBITUSSIN Take 5 mLs by mouth every 4 (four) hours as needed for cough or to loosen phlegm.   melatonin 5 MG Tabs Take 5 mg by mouth at bedtime as needed (insomnia).   spironolactone 25 MG tablet Commonly known as: ALDACTONE Take 25 mg by mouth daily.        Follow-up [employer] [facility] Follow up in 1 week(s).   Contact information: [address] [place]  [address] [phone]                Allergies  Allergen Reactions   Lisinopril Swelling   Zolpidem Tartrate     Other reaction(s): Agitation   Eggs Or Egg-Derived Products     Upset stomach    Other     Lactose intolerant    The results of significant diagnostics from this hospitalization (including imaging, microbiology, ancillary and laboratory) are listed below for reference.    Microbiology: No results found for this or any previous visit (from the past 240 hour(s)).  Procedures/Studies: CT ABDOMEN PELVIS WO CONTRAST  Result Date: 06/30/2022 CLINICAL DATA:  Acute generalized abdominal pain. EXAM: CT ABDOMEN AND PELVIS WITHOUT CONTRAST TECHNIQUE: Multidetector CT imaging of the abdomen and pelvis was performed following the standard protocol without IV contrast. RADIATION DOSE REDUCTION: This exam was performed according to the departmental dose-optimization program which includes automated exposure control, adjustment of the mA and/or kV according to patient size and/or use of iterative reconstruction technique. COMPARISON:  June 05, 2019. FINDINGS: Lower chest: No acute abnormality. Hepatobiliary: No focal liver abnormality is seen. No gallstones, gallbladder wall thickening, or biliary dilatation. Pancreas: Unremarkable. No pancreatic ductal dilatation or surrounding inflammatory changes. Spleen: Normal in size without focal abnormality. Adrenals/Urinary Tract: Adrenal glands are unremarkable. Kidneys are normal, without renal calculi, focal lesion, or hydronephrosis. Bladder is unremarkable. Stomach/Bowel: Stomach is within normal limits. Appendix appears normal. No evidence of bowel wall thickening, distention, or inflammatory changes. Vascular/Lymphatic: Aortic atherosclerosis. No enlarged abdominal or pelvic lymph nodes. Reproductive: Status post hysterectomy. No adnexal masses. Other: Mild bilateral fat containing inguinal hernias are noted. No ascites is noted.  Musculoskeletal: No acute or significant osseous findings. IMPRESSION: Mild bilateral fat containing inguinal hernias. No acute abnormality seen in the abdomen or pelvis. Aortic Atherosclerosis (ICD10-I70.0). Electronically Signed   By: Lupita Raider M.D.   On: 06/30/2022 12:54    Labs: BNP (last 3 results) No results for input(s): "BNP" in the last 8760 hours. Basic Metabolic Panel: Recent Labs  Lab 06/30/22 1050 07/01/22 0617  NA 140 140  K 4.4 3.9  CL 102 104  CO2 30 30  GLUCOSE 86 95  BUN 36* 25*  CREATININE 2.08* 1.55*  CALCIUM 9.8 9.3   Liver Function Tests: Recent Labs  Lab 06/30/22 1050 07/01/22 0617  AST 18 16  ALT 11 10  ALKPHOS 101 84  BILITOT 0.8 0.5  PROT 7.7 6.7  ALBUMIN 4.4 3.5   Recent Labs  Lab 06/30/22 1050  LIPASE 44   No results for  input(s): "AMMONIA" in the last 168 hours. CBC: Recent Labs  Lab 06/30/22 1050 07/01/22 0617  WBC 6.3 4.9  NEUTROABS 4.1  --   HGB 11.2* 10.5*  HCT 36.5 34.3*  MCV 88.4 88.9  PLT 186 184   Cardiac Enzymes: No results for input(s): "CKTOTAL", "CKMB", "CKMBINDEX", "TROPONINI" in the last 168 hours. BNP: Invalid input(s): "POCBNP" CBG: No results for input(s): "GLUCAP" in the last 168 hours. D-Dimer No results for input(s): "DDIMER" in the last 72 hours. Hgb A1c No results for input(s): "HGBA1C" in the last 72 hours. Lipid Profile No results for input(s): "CHOL", "HDL", "LDLCALC", "TRIG", "CHOLHDL", "LDLDIRECT" in the last 72 hours. Thyroid function studies No results for input(s): "TSH", "T4TOTAL", "T3FREE", "THYROIDAB" in the last 72 hours.  Invalid input(s): "FREET3" Anemia work up No results for input(s): "VITAMINB12", "FOLATE", "FERRITIN", "TIBC", "IRON", "RETICCTPCT" in the last 72 hours. Urinalysis    Component Value Date/Time   COLORURINE YELLOW 06/30/2022 1223   APPEARANCEUR HAZY (A) 06/30/2022 1223   LABSPEC 1.010 06/30/2022 1223   PHURINE 5.0 06/30/2022 1223   GLUCOSEU NEGATIVE  06/30/2022 1223   HGBUR NEGATIVE 06/30/2022 1223   BILIRUBINUR NEGATIVE 06/30/2022 1223   BILIRUBINUR negative 03/01/2015 1623   KETONESUR NEGATIVE 06/30/2022 1223   PROTEINUR NEGATIVE 06/30/2022 1223   UROBILINOGEN 1.0 03/01/2015 1623   UROBILINOGEN 1.0 10/25/2013 2053   NITRITE NEGATIVE 06/30/2022 1223   LEUKOCYTESUR SMALL (A) 06/30/2022 1223   Sepsis Labs Recent Labs  Lab 06/30/22 1050 07/01/22 0617  WBC 6.3 4.9   Microbiology No results found for this or any previous visit (from the past 240 hour(s)). Time coordinating discharge: 25 minutes  SIGNED: Lanae Boast, MD  [facility] 07/01/2022, 10:17 AM  If 7PM-7AM, please contact night-coverage [email or web address]

## 2022-10-17 ENCOUNTER — Ambulatory Visit
Admission: RE | Admit: 2022-10-17 | Discharge: 2022-10-17 | Disposition: A | Discharge status: Home / Self Care | Payer: No Typology Code available for payment source | Source: Ambulatory Visit

## 2022-10-17 ENCOUNTER — Other Ambulatory Visit: Payer: Self-pay

## 2022-10-17 DIAGNOSIS — Z0001 Encounter for general adult medical examination with abnormal findings: Secondary | ICD-10-CM

## 2023-09-02 ENCOUNTER — Ambulatory Visit
Admission: RE | Admit: 2023-09-02 | Discharge: 2023-09-02 | Disposition: A | Discharge status: Home / Self Care | Payer: Medicare (Managed Care) | Source: Ambulatory Visit | Attending: Vascular Surgery | Admitting: Vascular Surgery

## 2023-09-02 ENCOUNTER — Other Ambulatory Visit: Payer: Self-pay | Admitting: Vascular Surgery

## 2023-09-02 DIAGNOSIS — R52 Pain, unspecified: Secondary | ICD-10-CM

## 2023-09-02 DIAGNOSIS — W19XXXA Unspecified fall, initial encounter: Secondary | ICD-10-CM

## 2023-09-19 ENCOUNTER — Emergency Department (HOSPITAL_COMMUNITY): Payer: Medicare (Managed Care)

## 2023-09-19 ENCOUNTER — Other Ambulatory Visit: Payer: Self-pay

## 2023-09-19 ENCOUNTER — Emergency Department (HOSPITAL_COMMUNITY)
Admission: EM | Admit: 2023-09-19 | Discharge: 2023-09-19 | Disposition: A | Discharge status: Home / Self Care | Payer: Medicare (Managed Care) | Attending: Emergency Medicine | Admitting: Emergency Medicine

## 2023-09-19 DIAGNOSIS — Z7951 Long term (current) use of inhaled steroids: Secondary | ICD-10-CM | POA: Diagnosis not present

## 2023-09-19 DIAGNOSIS — Z79899 Other long term (current) drug therapy: Secondary | ICD-10-CM | POA: Insufficient documentation

## 2023-09-19 DIAGNOSIS — I2510 Atherosclerotic heart disease of native coronary artery without angina pectoris: Secondary | ICD-10-CM | POA: Insufficient documentation

## 2023-09-19 DIAGNOSIS — I10 Essential (primary) hypertension: Secondary | ICD-10-CM | POA: Diagnosis not present

## 2023-09-19 DIAGNOSIS — R197 Diarrhea, unspecified: Secondary | ICD-10-CM | POA: Insufficient documentation

## 2023-09-19 DIAGNOSIS — R001 Bradycardia, unspecified: Secondary | ICD-10-CM | POA: Diagnosis not present

## 2023-09-19 DIAGNOSIS — Z7982 Long term (current) use of aspirin: Secondary | ICD-10-CM | POA: Insufficient documentation

## 2023-09-19 DIAGNOSIS — J45909 Unspecified asthma, uncomplicated: Secondary | ICD-10-CM | POA: Diagnosis not present

## 2023-09-19 DIAGNOSIS — R109 Unspecified abdominal pain: Secondary | ICD-10-CM | POA: Insufficient documentation

## 2023-09-19 DIAGNOSIS — R55 Syncope and collapse: Secondary | ICD-10-CM | POA: Diagnosis present

## 2023-09-19 LAB — CBG MONITORING, ED: Glucose-Capillary: 127 mg/dL — ABNORMAL HIGH (ref 70–99)

## 2023-09-19 LAB — BASIC METABOLIC PANEL
Anion gap: 15 (ref 5–15)
BUN: 39 mg/dL — ABNORMAL HIGH (ref 8–23)
CO2: 24 mmol/L (ref 22–32)
Calcium: 10.2 mg/dL (ref 8.9–10.3)
Chloride: 97 mmol/L — ABNORMAL LOW (ref 98–111)
Creatinine, Ser: 2 mg/dL — ABNORMAL HIGH (ref 0.44–1.00)
GFR, Estimated: 26 mL/min — ABNORMAL LOW (ref >60)
Glucose, Bld: 134 mg/dL — ABNORMAL HIGH (ref 70–99)
Potassium: 4.2 mmol/L (ref 3.5–5.1)
Sodium: 136 mmol/L (ref 135–145)

## 2023-09-19 LAB — CBC
HCT: 35.2 % — ABNORMAL LOW (ref 36.0–46.0)
Hemoglobin: 11.1 g/dL — ABNORMAL LOW (ref 12.0–15.0)
MCH: 28.7 pg (ref 26.0–34.0)
MCHC: 31.5 g/dL (ref 30.0–36.0)
MCV: 91 fL (ref 80.0–100.0)
Platelets: 178 10*3/uL (ref 150–400)
RBC: 3.87 MIL/uL (ref 3.87–5.11)
RDW: 13.8 % (ref 11.5–15.5)
WBC: 3.9 10*3/uL — ABNORMAL LOW (ref 4.0–10.5)
nRBC: 0 % (ref 0.0–0.2)

## 2023-09-19 LAB — TROPONIN I (HIGH SENSITIVITY): Troponin I (High Sensitivity): 12 ng/L (ref <18)

## 2023-09-19 LAB — HEPATIC FUNCTION PANEL
ALT: 13 U/L (ref 0–44)
AST: 23 U/L (ref 15–41)
Albumin: 4.5 g/dL (ref 3.5–5.0)
Alkaline Phosphatase: 88 U/L (ref 38–126)
Bilirubin, Direct: 0.2 mg/dL (ref 0.0–0.2)
Indirect Bilirubin: 0.6 mg/dL (ref 0.3–0.9)
Total Bilirubin: 0.8 mg/dL (ref 0.0–1.2)
Total Protein: 7.5 g/dL (ref 6.5–8.1)

## 2023-09-19 MED ORDER — SODIUM CHLORIDE 0.9 % IV BOLUS
500.0000 mL | Freq: Once | INTRAVENOUS | Status: AC
  Administered 2023-09-19: 500 mL via INTRAVENOUS

## 2023-09-19 NOTE — ED Triage Notes (Signed)
Patient from home via EMS for eval of syncope. Patient was trying to vomit and became unresponsive for "a few minutes" while on the toilet per the home CNA, drooling but then becoming alert. Did not fall, hit head, or have injury from the syncope. N/v/d x 2 months. Endorses mild upper abdominal.

## 2023-09-19 NOTE — ED Notes (Signed)
Asked Pt last time she urinated, unsure and didn't want to try at this time.

## 2023-09-19 NOTE — ED Provider Notes (Signed)
[place] EMERGENCY DEPARTMENT AT [facility] Provider Note   CSN: [id number] Arrival date & time: 09/19/23  1747     History  Chief Complaint  Patient presents with   Loss of Consciousness    [name] is a 78 y.o. female.  Pt is a 78 yo female with pmhx significant for cva, cad, htn, hld, arthritis, asthma, albinism, legally blindness.  Pt lives at home with her daughter.  She has had n/v and diarrhea for about 2 months.  She said she's lost several pounds due to these sx.  Pt said she's seen her doctor, but has been told there is nothing they can do.  Pt was on the toilet pta and became unresponsive for a few minutes.  She did not fall or hit her head.  A CNA was with her.  Pt's nausea has improved.  She does have some upper abd pain.  Pt's daughter said she has not had chronic diarrhea.  Daughter had a 24 hr bug a few days ago and thinks pt had the same thing.         Home Medications Prior to Admission medications   Medication Sig Start Date End Date Taking? Authorizing Provider  albuterol (ACCUNEB) 0.63 MG/3ML nebulizer solution Take 3 mLs by nebulization 3 (three) times daily as needed for wheezing or shortness of breath.    [provider]  albuterol (PROVENTIL HFA;VENTOLIN HFA) 108 (90 BASE) MCG/ACT inhaler Inhale 2 puffs into the lungs every 6 (six) hours as needed for wheezing or shortness of breath. 03/20/15   Gordy Savers, MD  aspirin EC 81 MG tablet Take 81 mg by mouth daily.    [provider]  atorvastatin (LIPITOR) 10 MG tablet Take 10 mg by mouth daily.    [provider]  budesonide-formoterol (SYMBICORT) 160-4.5 MCG/ACT inhaler Inhale 2 puffs into the lungs 2 (two) times daily.     [provider]  citalopram (CELEXA) 20 MG tablet Take 20 mg by mouth daily.    [provider]  diclofenac Sodium (VOLTAREN) 1 % GEL Apply 4 g topically 4 (four) times daily. Patient not taking: Reported on  06/30/2022 02/02/20   Ernestene Kiel T, DPM  diphenhydrAMINE (BENADRYL) 25 MG tablet Take 1 tablet (25 mg total) by mouth every 6 (six) hours. Patient taking differently: Take 25 mg by mouth 3 (three) times daily as needed (lip swelling). 02/08/18   Derwood Kaplan, MD  EPINEPHrine 0.3 mg/0.3 mL IJ SOAJ injection Inject 0.3 mg into the muscle See admin instructions. Inject once as needed for allergic reaction. May repeat dose x1 after 5-15 minutes if needed.    [provider]  fluticasone (FLONASE) 50 MCG/ACT nasal spray Place 1 spray into both nostrils daily.    [provider]  guaiFENesin (ROBITUSSIN) 100 MG/5ML SOLN Take 5 mLs by mouth every 4 (four) hours as needed for cough or to loosen phlegm. Patient not taking: Reported on 06/30/2022    [provider]  Melatonin 5 MG TABS Take 5 mg by mouth at bedtime as needed (insomnia).     [provider]  spironolactone (ALDACTONE) 25 MG tablet Take 25 mg by mouth daily.     [provider]  losartan-hydrochlorothiazide (HYZAAR) 100-25 MG per tablet Take 1 tablet by mouth daily. 11/03/11 11/14/11  Worthy Rancher B, FNP      Allergies    Lisinopril, Zolpidem tartrate, Egg-derived products, and Other    Review of Systems  Review of Systems  Gastrointestinal:  Positive for abdominal pain, diarrhea, nausea and vomiting.  Neurological:  Positive for syncope.  All other systems reviewed and are negative.   Physical Exam Updated Vital Signs BP 136/64   Pulse (!) 53   Temp 97.9 F (36.6 C) (Oral)   Resp 16   Ht 5\' 3"  (1.6 m)   Wt 73 kg   SpO2 99%   BMI 28.52 kg/m  Physical Exam Vitals and nursing note reviewed.  Constitutional:      Appearance: Normal appearance.  HENT:     Head: Normocephalic and atraumatic.     Right Ear: External ear normal.     Left Ear: External ear normal.     Nose: Nose normal.     Mouth/Throat:     Mouth: Mucous membranes are moist.     Pharynx: Oropharynx is clear.   Eyes:     Comments: Legally blind  Cardiovascular:     Rate and Rhythm: Regular rhythm. Bradycardia present.     Pulses: Normal pulses.     Heart sounds: Normal heart sounds.  Pulmonary:     Effort: Pulmonary effort is normal.     Breath sounds: Normal breath sounds.  Abdominal:     General: Abdomen is flat. Bowel sounds are normal.     Palpations: Abdomen is soft.     Tenderness: There is abdominal tenderness in the epigastric area.  Musculoskeletal:        General: Normal range of motion.     Cervical back: Normal range of motion and neck supple.  Skin:    General: Skin is warm.     Capillary Refill: Capillary refill takes less than 2 seconds.  Neurological:     General: No focal deficit present.     Mental Status: She is alert and oriented to person, place, and time.  Psychiatric:        Mood and Affect: Mood normal.        Behavior: Behavior normal.     ED Results / Procedures / Treatments   Labs (all labs ordered are listed, but only abnormal results are displayed) Labs Reviewed  BASIC METABOLIC PANEL - Abnormal; Notable for the following components:      Result Value   Chloride 97 (*)    Glucose, Bld 134 (*)    BUN 39 (*)    Creatinine, Ser 2.00 (*)    GFR, Estimated 26 (*)    All other components within normal limits  CBC - Abnormal; Notable for the following components:   WBC 3.9 (*)    Hemoglobin 11.1 (*)    HCT 35.2 (*)    All other components within normal limits  CBG MONITORING, ED - Abnormal; Notable for the following components:   Glucose-Capillary 127 (*)    All other components within normal limits  HEPATIC FUNCTION PANEL  URINALYSIS, ROUTINE W REFLEX MICROSCOPIC  TROPONIN I (HIGH SENSITIVITY)  TROPONIN I (HIGH SENSITIVITY)    EKG EKG Interpretation Date/Time:  Saturday September 19 2023 18:05:08 EST Ventricular Rate:  56 PR Interval:  208 QRS Duration:  88 QT Interval:  434 QTC Calculation: 418 R Axis:   214  Text Interpretation: Sinus  bradycardia Right superior axis deviation Cannot rule out Anterior infarct , age undetermined Abnormal ECG When compared with ECG of 31-Jan-2021 13:07, PREVIOUS ECG IS PRESENT No significant change since last tracing Confirmed by Jacalyn Lefevre [phone]) on 09/19/2023 7:14:31 PM  Radiology CT Head Wo Contrast Result  Date: 09/19/2023 CLINICAL DATA:  Mental status change, unknown cause EXAM: CT HEAD WITHOUT CONTRAST TECHNIQUE: Contiguous axial images were obtained from the base of the skull through the vertex without intravenous contrast. RADIATION DOSE REDUCTION: This exam was performed according to the departmental dose-optimization program which includes automated exposure control, adjustment of the mA and/or kV according to patient size and/or use of iterative reconstruction technique. COMPARISON:  CT head 06/05/2019 FINDINGS: Brain: Patchy and confluent areas of decreased attenuation are noted throughout the deep and periventricular white matter of the cerebral hemispheres bilaterally, compatible with chronic microvascular ischemic disease. No evidence of large-territorial acute infarction. No parenchymal hemorrhage. No mass lesion. No extra-axial collection. No mass effect or midline shift. No hydrocephalus. Basilar cisterns are patent. Vascular: No hyperdense vessel. Atherosclerotic calcifications are present within the cavernous internal carotid and vertebral arteries. Skull: No acute fracture or focal lesion. Sinuses/Orbits: Paranasal sinuses and mastoid air cells are clear. The orbits are unremarkable. Other: None. IMPRESSION: No acute intracranial abnormality. Electronically Signed   By: Tish Frederickson M.D.   On: 09/19/2023 20:44   CT ABDOMEN PELVIS WO CONTRAST Result Date: 09/19/2023 CLINICAL DATA:  Abdominal pain, acute, nonlocalized EXAM: CT ABDOMEN AND PELVIS WITHOUT CONTRAST TECHNIQUE: Multidetector CT imaging of the abdomen and pelvis was performed following the standard protocol without IV  contrast. RADIATION DOSE REDUCTION: This exam was performed according to the departmental dose-optimization program which includes automated exposure control, adjustment of the mA and/or kV according to patient size and/or use of iterative reconstruction technique. COMPARISON:  None Available. FINDINGS: Lower chest: Small hiatal hernia.  Coronary artery calcification. Hepatobiliary: No focal liver abnormality. No gallstones, gallbladder wall thickening, or pericholecystic fluid. No biliary dilatation. Pancreas: No focal lesion. Normal pancreatic contour. No surrounding inflammatory changes. No main pancreatic ductal dilatation. Spleen: Normal in size without focal abnormality. Adrenals/Urinary Tract: No adrenal nodule bilaterally. No nephrolithiasis and no hydronephrosis. No definite contour-deforming renal mass. No ureterolithiasis or hydroureter. The urinary bladder is unremarkable. Stomach/Bowel: Stomach is within normal limits. No evidence of bowel wall thickening or dilatation. Colonic diverticulosis. Appendix appears normal. Vascular/Lymphatic: No abdominal aorta or iliac aneurysm. Severe atherosclerotic plaque of the aorta and its branches. No abdominal, pelvic, or inguinal lymphadenopathy. Reproductive: Status post hysterectomy. No adnexal masses. Other: No intraperitoneal free fluid. No intraperitoneal free gas. No organized fluid collection. Musculoskeletal: Diastasis rectus. Tiny fat containing umbilical hernia. Bilateral fat containing inguinal hernias. No suspicious lytic or blastic osseous lesions. No acute displaced fracture. Multilevel degenerative changes of the spine. IMPRESSION: 1. No acute intra-abdominal or intrapelvic abnormality with limited evaluation on this noncontrast study. 2. Colonic diverticulosis with no acute diverticulitis. 3. Small hiatal hernia. 4. Tiny fat containing umbilical hernia. 5. Bilateral fat containing inguinal hernias. 6.  Aortic Atherosclerosis (ICD10-I70.0).  Electronically Signed   By: Tish Frederickson M.D.   On: 09/19/2023 20:42   DG Chest Portable 1 View Result Date: 09/19/2023 CLINICAL DATA:  Syncope. EXAM: PORTABLE CHEST 1 VIEW COMPARISON:  AP and lateral chest 10/17/2022 FINDINGS: There is mild cardiomegaly without evidence of CHF. The mediastinum is normally outlined. Both lungs are clear. The visualized skeletal structures are intact, with degenerative disc disease and spondylosis of the thoracic spine. Compare: Stable chest. IMPRESSION: No evidence of acute chest disease. Mild cardiomegaly. Electronically Signed   By: Almira Bar M.D.   On: 09/19/2023 20:02    Procedures Procedures    Medications Ordered in ED Medications  sodium chloride 0.9 % bolus 500 mL (0 mLs Intravenous Stopped  09/19/23 2143)    ED Course/ Medical Decision Making/ A&P                                 Medical Decision Making Amount and/or Complexity of Data Reviewed Labs: ordered. Radiology: ordered.   This patient presents to the ED for concern of syncope/abd pain, this involves an extensive number of treatment options, and is a complaint that carries with it a high risk of complications and morbidity.  The differential diagnosis includes dehydration, vasovagal event, infection   Co morbidities that complicate the patient evaluation  cva, cad, htn, hld, arthritis, asthma, albinism, legally blindness   Additional history obtained:  Additional history obtained from epic chart review External records from outside source obtained and reviewed including EMS report   Lab Tests:  I Ordered, and personally interpreted labs.  The pertinent results include:  cbc with hgb 11.1 (chronic); bmp with bun 39 and cr 2 (cr 1.55 on 11/7); lfts nl, trop nl   Imaging Studies ordered:  I ordered imaging studies including cxr, ct head, ct abd/pelvis  I independently visualized and interpreted imaging which showed  CXR: No acute intracranial abnormality.  CT  head: No acute intracranial abnormality.  CT abd/pelvis: No acute intra-abdominal or intrapelvic abnormality with limited  evaluation on this noncontrast study.  2. Colonic diverticulosis with no acute diverticulitis.  3. Small hiatal hernia.  4. Tiny fat containing umbilical hernia.  5. Bilateral fat containing inguinal hernias.  6.  Aortic Atherosclerosis (ICD10-I70.0).   I agree with the radiologist interpretation   Cardiac Monitoring:  The patient was maintained on a cardiac monitor.  I personally viewed and interpreted the cardiac monitored which showed an underlying rhythm of: nsr   Medicines ordered and prescription drug management:  I ordered medication including ivfs  for sx  Reevaluation of the patient after these medicines showed that the patient improved I have reviewed the patients home medicines and have made adjustments as needed   Test Considered:  ct   Critical Interventions:  ivfs   Problem List / ED Course:  Syncope:  likely vasovagal from having a bowel movement.  Pt looks good after ivfs.  Work up neg for anything acute.  She is eating/drinking. Diarrhea:  no diarrhea here.  She is a little dehydrated.  Daughter said it's not been chronic.  Pt is stable for d/c.  Return if worse.    Reevaluation:  After the interventions noted above, I reevaluated the patient and found that they have :improved   Social Determinants of Health:  Lives at home   Dispostion:  After consideration of the diagnostic results and the patients response to treatment, I feel that the patent would benefit from discharge with outpatient f/u.          Final Clinical Impression(s) / ED Diagnoses Final diagnoses:  Syncope, unspecified syncope type  Diarrhea, unspecified type    Rx / DC Orders ED Discharge Orders     None         Jacalyn Lefevre, MD 09/19/23 2225

## 2023-10-24 ENCOUNTER — Other Ambulatory Visit: Payer: Self-pay

## 2023-10-24 ENCOUNTER — Encounter (HOSPITAL_COMMUNITY): Payer: Self-pay

## 2023-10-24 ENCOUNTER — Emergency Department (HOSPITAL_COMMUNITY): Payer: Medicare (Managed Care)

## 2023-10-24 ENCOUNTER — Emergency Department (HOSPITAL_COMMUNITY)
Admission: EM | Admit: 2023-10-24 | Discharge: 2023-10-24 | Disposition: A | Discharge status: Home / Self Care | Payer: Medicare (Managed Care) | Attending: Emergency Medicine | Admitting: Emergency Medicine

## 2023-10-24 DIAGNOSIS — R197 Diarrhea, unspecified: Secondary | ICD-10-CM | POA: Diagnosis present

## 2023-10-24 DIAGNOSIS — Z7982 Long term (current) use of aspirin: Secondary | ICD-10-CM | POA: Diagnosis not present

## 2023-10-24 DIAGNOSIS — K5900 Constipation, unspecified: Secondary | ICD-10-CM

## 2023-10-24 DIAGNOSIS — N1832 Chronic kidney disease, stage 3b: Secondary | ICD-10-CM | POA: Diagnosis not present

## 2023-10-24 DIAGNOSIS — R8290 Unspecified abnormal findings in urine: Secondary | ICD-10-CM | POA: Diagnosis not present

## 2023-10-24 LAB — URINALYSIS, ROUTINE W REFLEX MICROSCOPIC
Bilirubin Urine: NEGATIVE
Glucose, UA: NEGATIVE mg/dL
Hgb urine dipstick: NEGATIVE
Ketones, ur: NEGATIVE mg/dL
Leukocytes,Ua: NEGATIVE
Nitrite: NEGATIVE
Protein, ur: NEGATIVE mg/dL
Specific Gravity, Urine: 1.011 (ref 1.005–1.030)
pH: 7 (ref 5.0–8.0)

## 2023-10-24 LAB — COMPREHENSIVE METABOLIC PANEL
ALT: 11 U/L (ref 0–44)
AST: 22 U/L (ref 15–41)
Albumin: 4.3 g/dL (ref 3.5–5.0)
Alkaline Phosphatase: 82 U/L (ref 38–126)
Anion gap: 13 (ref 5–15)
BUN: 36 mg/dL — ABNORMAL HIGH (ref 8–23)
CO2: 28 mmol/L (ref 22–32)
Calcium: 10.2 mg/dL (ref 8.9–10.3)
Chloride: 100 mmol/L (ref 98–111)
Creatinine, Ser: 1.49 mg/dL — ABNORMAL HIGH (ref 0.44–1.00)
GFR, Estimated: 36 mL/min — ABNORMAL LOW (ref >60)
Glucose, Bld: 90 mg/dL (ref 70–99)
Potassium: 4.2 mmol/L (ref 3.5–5.1)
Sodium: 141 mmol/L (ref 135–145)
Total Bilirubin: 0.5 mg/dL (ref 0.0–1.2)
Total Protein: 7.4 g/dL (ref 6.5–8.1)

## 2023-10-24 LAB — CBC
HCT: 35.4 % — ABNORMAL LOW (ref 36.0–46.0)
Hemoglobin: 11 g/dL — ABNORMAL LOW (ref 12.0–15.0)
MCH: 28.3 pg (ref 26.0–34.0)
MCHC: 31.1 g/dL (ref 30.0–36.0)
MCV: 91 fL (ref 80.0–100.0)
Platelets: 189 10*3/uL (ref 150–400)
RBC: 3.89 MIL/uL (ref 3.87–5.11)
RDW: 13.5 % (ref 11.5–15.5)
WBC: 5.2 10*3/uL (ref 4.0–10.5)
nRBC: 0 % (ref 0.0–0.2)

## 2023-10-24 LAB — RESP PANEL BY RT-PCR (RSV, FLU A&B, COVID)  RVPGX2
Influenza A by PCR: NEGATIVE
Influenza B by PCR: NEGATIVE
Resp Syncytial Virus by PCR: NEGATIVE
SARS Coronavirus 2 by RT PCR: NEGATIVE

## 2023-10-24 MED ORDER — SODIUM CHLORIDE 0.9 % IV BOLUS
1000.0000 mL | Freq: Once | INTRAVENOUS | Status: AC
  Administered 2023-10-24: 1000 mL via INTRAVENOUS

## 2023-10-24 NOTE — ED Provider Notes (Signed)
 [place] EMERGENCY DEPARTMENT AT [facility] Provider Note   CSN: [id number] Arrival date & time: 10/24/23  1033     History  Chief Complaint  Patient presents with   Diarrhea    [name] is a 78 y.o. female.  Patient arrives with noted complaint of diarrhea, and malodorous urine. On arrival to ED, pt indicates she is just hungry and thirsty, and indicates had not yet eaten today. Pt denies abd pain. No vomiting. Denies new or worsening diarrhea. No dysuria. No fever or chills. Pt limited historian - level 5  caveat.   The history is provided by the patient, the EMS personnel and medical records. The history is limited by the condition of the patient.  Dysuria Associated symptoms: no abdominal pain, no fever, no flank pain and no vomiting   Diarrhea Associated symptoms: no abdominal pain, no chills, no fever, no headaches and no vomiting        Home Medications Prior to Admission medications   Medication Sig Start Date End Date Taking? Authorizing Provider  albuterol (ACCUNEB) 0.63 MG/3ML nebulizer solution Take 3 mLs by nebulization 3 (three) times daily as needed for wheezing or shortness of breath.    [provider]  albuterol (PROVENTIL HFA;VENTOLIN HFA) 108 (90 BASE) MCG/ACT inhaler Inhale 2 puffs into the lungs every 6 (six) hours as needed for wheezing or shortness of breath. 03/20/15   Gordy Savers, MD  aspirin EC 81 MG tablet Take 81 mg by mouth daily.    [provider]  atorvastatin (LIPITOR) 10 MG tablet Take 10 mg by mouth daily.    [provider]  budesonide-formoterol (SYMBICORT) 160-4.5 MCG/ACT inhaler Inhale 2 puffs into the lungs 2 (two) times daily.     [provider]  citalopram (CELEXA) 20 MG tablet Take 20 mg by mouth daily.    [provider]  diclofenac Sodium (VOLTAREN) 1 % GEL Apply 4 g topically 4 (four) times daily. Patient not taking: Reported on 06/30/2022 02/02/20   Ernestene Kiel T, DPM  diphenhydrAMINE (BENADRYL) 25 MG tablet Take 1 tablet (25 mg total) by mouth every 6 (six) hours. Patient taking differently: Take 25 mg by mouth 3 (three) times daily as needed (lip swelling). 02/08/18   Derwood Kaplan, MD  EPINEPHrine 0.3 mg/0.3 mL IJ SOAJ injection Inject 0.3 mg into the muscle See admin instructions. Inject once as needed for allergic reaction. May repeat dose x1 after 5-15 minutes if needed.    [provider]  fluticasone (FLONASE) 50 MCG/ACT nasal spray Place 1 spray into both nostrils daily.    [provider]  guaiFENesin (ROBITUSSIN) 100 MG/5ML SOLN Take 5 mLs by mouth every 4 (four) hours as needed for cough or to loosen phlegm. Patient not taking: Reported on 06/30/2022    [provider]  Melatonin 5 MG TABS Take 5 mg by mouth at bedtime as needed (insomnia).     [provider]  spironolactone (ALDACTONE) 25 MG tablet Take 25 mg by mouth daily.     [provider]  losartan-hydrochlorothiazide (HYZAAR) 100-25 MG per tablet Take 1 tablet by mouth daily. 11/03/11 11/14/11  Worthy Rancher B, FNP      Allergies    Lisinopril, Zolpidem tartrate, Egg-derived products, and Other    Review of Systems   Review of Systems  Constitutional:  Negative for chills and fever.  HENT:  Negative for sore throat.   Respiratory:  Negative for cough and shortness  of breath.   Cardiovascular:  Negative for chest pain.  Gastrointestinal:  Positive for diarrhea. Negative for abdominal pain and vomiting.  Genitourinary:  Negative for dysuria and flank pain.  Musculoskeletal:  Negative for back pain and neck pain.  Skin:  Negative for rash.  Neurological:  Negative for light-headedness and headaches.    Physical Exam Updated Vital Signs BP 138/60   Pulse (!) 58   Temp 98.3 F (36.8 C) (Oral)   Resp 14   SpO2 99%  Physical Exam Vitals and nursing note reviewed.  Constitutional:      Appearance: Normal appearance. She is  well-developed.  HENT:     Head: Atraumatic.     Nose: Nose normal.     Mouth/Throat:     Mouth: Mucous membranes are moist.  Eyes:     General: No scleral icterus.    Conjunctiva/sclera: Conjunctivae normal.     Pupils: Pupils are equal, round, and reactive to light.  Neck:     Trachea: No tracheal deviation.  Cardiovascular:     Rate and Rhythm: Normal rate and regular rhythm.     Pulses: Normal pulses.     Heart sounds: Normal heart sounds. No murmur heard.    No friction rub. No gallop.  Pulmonary:     Effort: Pulmonary effort is normal. No respiratory distress.     Breath sounds: Normal breath sounds.  Abdominal:     General: Bowel sounds are normal. There is no distension.     Palpations: Abdomen is soft.     Tenderness: There is no abdominal tenderness. There is no guarding.  Genitourinary:    Comments: No cva tenderness.  Musculoskeletal:        General: No swelling or tenderness.     Cervical back: Normal range of motion and neck supple. No rigidity. No muscular tenderness.  Skin:    General: Skin is warm and dry.     Findings: No rash.  Neurological:     Mental Status: She is alert.     Comments: Alert, speech normal. Motor/sens grossly intact bil.   Psychiatric:        Mood and Affect: Mood normal.     ED Results / Procedures / Treatments   Labs (all labs ordered are listed, but only abnormal results are displayed) Results for orders placed or performed during the hospital encounter of 10/24/23  CBC   Collection Time: 10/24/23 11:45 AM  Result Value Ref Range   WBC 5.2 4.0 - 10.5 K/uL   RBC 3.89 3.87 - 5.11 MIL/uL   Hemoglobin 11.0 (L) 12.0 - 15.0 g/dL   HCT 13.0 (L) 86.5 - 78.4 %   MCV 91.0 80.0 - 100.0 fL   MCH 28.3 26.0 - 34.0 pg   MCHC 31.1 30.0 - 36.0 g/dL   RDW 69.6 29.5 - 28.4 %   Platelets 189 150 - 400 K/uL   nRBC 0.0 0.0 - 0.2 %  Comprehensive metabolic panel   Collection Time: 10/24/23 11:45 AM  Result Value Ref Range   Sodium 141 135  - 145 mmol/L   Potassium 4.2 3.5 - 5.1 mmol/L   Chloride 100 98 - 111 mmol/L   CO2 28 22 - 32 mmol/L   Glucose, Bld 90 70 - 99 mg/dL   BUN 36 (H) 8 - 23 mg/dL   Creatinine, Ser 1.32 (H) 0.44 - 1.00 mg/dL   Calcium 44.0 8.9 - 10.2 mg/dL   Total Protein 7.4 6.5 - 8.1 g/dL  Albumin 4.3 3.5 - 5.0 g/dL   AST 22 15 - 41 U/L   ALT 11 0 - 44 U/L   Alkaline Phosphatase 82 38 - 126 U/L   Total Bilirubin 0.5 0.0 - 1.2 mg/dL   GFR, Estimated 36 (L) >60 mL/min   Anion gap 13 5 - 15  Resp panel by RT-PCR (RSV, Flu A&B, Covid) Anterior Nasal Swab   Collection Time: 10/24/23 11:57 AM   Specimen: Anterior Nasal Swab  Result Value Ref Range   SARS Coronavirus 2 by RT PCR NEGATIVE NEGATIVE   Influenza A by PCR NEGATIVE NEGATIVE   Influenza B by PCR NEGATIVE NEGATIVE   Resp Syncytial Virus by PCR NEGATIVE NEGATIVE     EKG None  Radiology No results found.  Procedures Procedures    Medications Ordered in ED Medications  sodium chloride 0.9 % bolus 1,000 mL (has no administration in time range)    ED Course/ Medical Decision Making/ A&P                                 Medical Decision Making Problems Addressed: Diarrhea in adult patient: acute illness or injury with systemic symptoms Stage 3b chronic kidney disease (HCC): chronic illness or injury with exacerbation, progression, or side effects of treatment that poses a threat to life or bodily functions  Amount and/or Complexity of Data Reviewed Independent Historian: EMS    Details: hx External Data Reviewed: notes. Labs: ordered. Decision-making details documented in ED Course. Radiology: ordered and independent interpretation performed. Decision-making details documented in ED Course.  Risk Decision regarding hospitalization.   Iv ns. Continuous pulse ox and cardiac monitoring. Labs ordered/sent.   Differential diagnosis includes aki, uti, GE, etc. Dispo decision including potential need for admission considered - will  get labs  and reassess.   Reviewed nursing notes and prior charts for additional history. External reports reviewed. Additional history from: EMS.   Pt asking for po fluids/food - provided.   Cardiac monitor: sinus rhythm, rate 60.  Labs reviewed/interpreted by me - wbc 5.2, hct 35. Covid and flu neg. Ckd c/w prior.   Po fluids/food - no vomiting. Abd soft nontender. Breathing comfortably.   Await UA.  No new c/o.   1530 UA pending, signed out to Dr Silverio Lay to check pending labs, recheck patient, and dispo appropriately.          Final Clinical Impression(s) / ED Diagnoses Final diagnoses:  Diarrhea in adult patient  Stage 3b chronic kidney disease [facility])    Rx / DC Orders ED Discharge Orders     None         Cathren Laine, MD 10/24/23 1539

## 2023-10-24 NOTE — Discharge Instructions (Signed)
 On your CT scan you have constipation.  Recommend MiraLAX daily or increase fiber intake  Your labs and your urinalysis are normal.  In other words you do not have a urinary tract infection  See your doctor for follow-up  Return to ER if you have worse abdominal pain or vomiting or trouble urinating or fever

## 2023-10-24 NOTE — ED Triage Notes (Signed)
 Pt from home with ems with multiple complaints:  Pt c.o pain with bowel movements, states she has had diarrhea for 2 years and has been losing a lot of weight. Pt states "I dont have a lot of teeth and I cannot chew my food good so my food doesn't digest properly" Pt also c.o darker and malodorous urine but denies pain with urination.  Pt also c.o chest congestion with slight cough. EMS reports rhonchi in all lung fields.  Pt a.o

## 2023-10-24 NOTE — ED Provider Notes (Signed)
  Physical Exam  BP (!) 136/58 (BP Location: Left Arm)   Pulse 60   Temp 98.3 F (36.8 C) (Oral)   Resp 18   SpO2 98%   Physical Exam  Procedures  Procedures  ED Course / MDM    Medical Decision Making Care assumed at 3 PM.  Patient has some abdominal pain and also questionable dysuria.  Signout pending urinalysis.  4 pm I examined the patient and patient has some lower abdominal tenderness.  I ordered a CT abdomen pelvis.  7:17 PM CT showed constipation.  Urinalysis showed no UTI.  Family is here to pick patient up.  Recommend MiraLAX.  Problems Addressed: Constipation, unspecified constipation type: acute illness or injury Stage 3b chronic kidney disease (HCC): acute illness or injury  Amount and/or Complexity of Data Reviewed Labs: ordered. Decision-making details documented in ED Course. Radiology: ordered and independent interpretation performed. Decision-making details documented in ED Course.          Charlynne Pander, MD 10/24/23 [phone]

## 2023-10-24 NOTE — ED Notes (Signed)
 Someone called asking about [name], was very rude on the phone and when not give their name.

## 2023-10-24 NOTE — ED Notes (Signed)
 This RN attempted straight cath x 2 with no success. Pt placed on purwick to attempt to collect urine sample, as pt is incontinent.  Pt incontinent care, peri care and brief change provided with straight cath attempt.

## 2023-10-25 LAB — URINE CULTURE: Culture: <10000 — AB

## 2024-03-30 IMAGING — CR DG FEMUR 2+V*R*
4 series · 4 of 4 positions shown · non-contrast
Comparison: None Available.

CLINICAL DATA: Fall and trauma to the right lower extremity.

EXAM:
DG HIP (WITH OR WITHOUT PELVIS) 2-3V RIGHT; RIGHT FEMUR 2 VIEWS

[t femur proximal ap right]
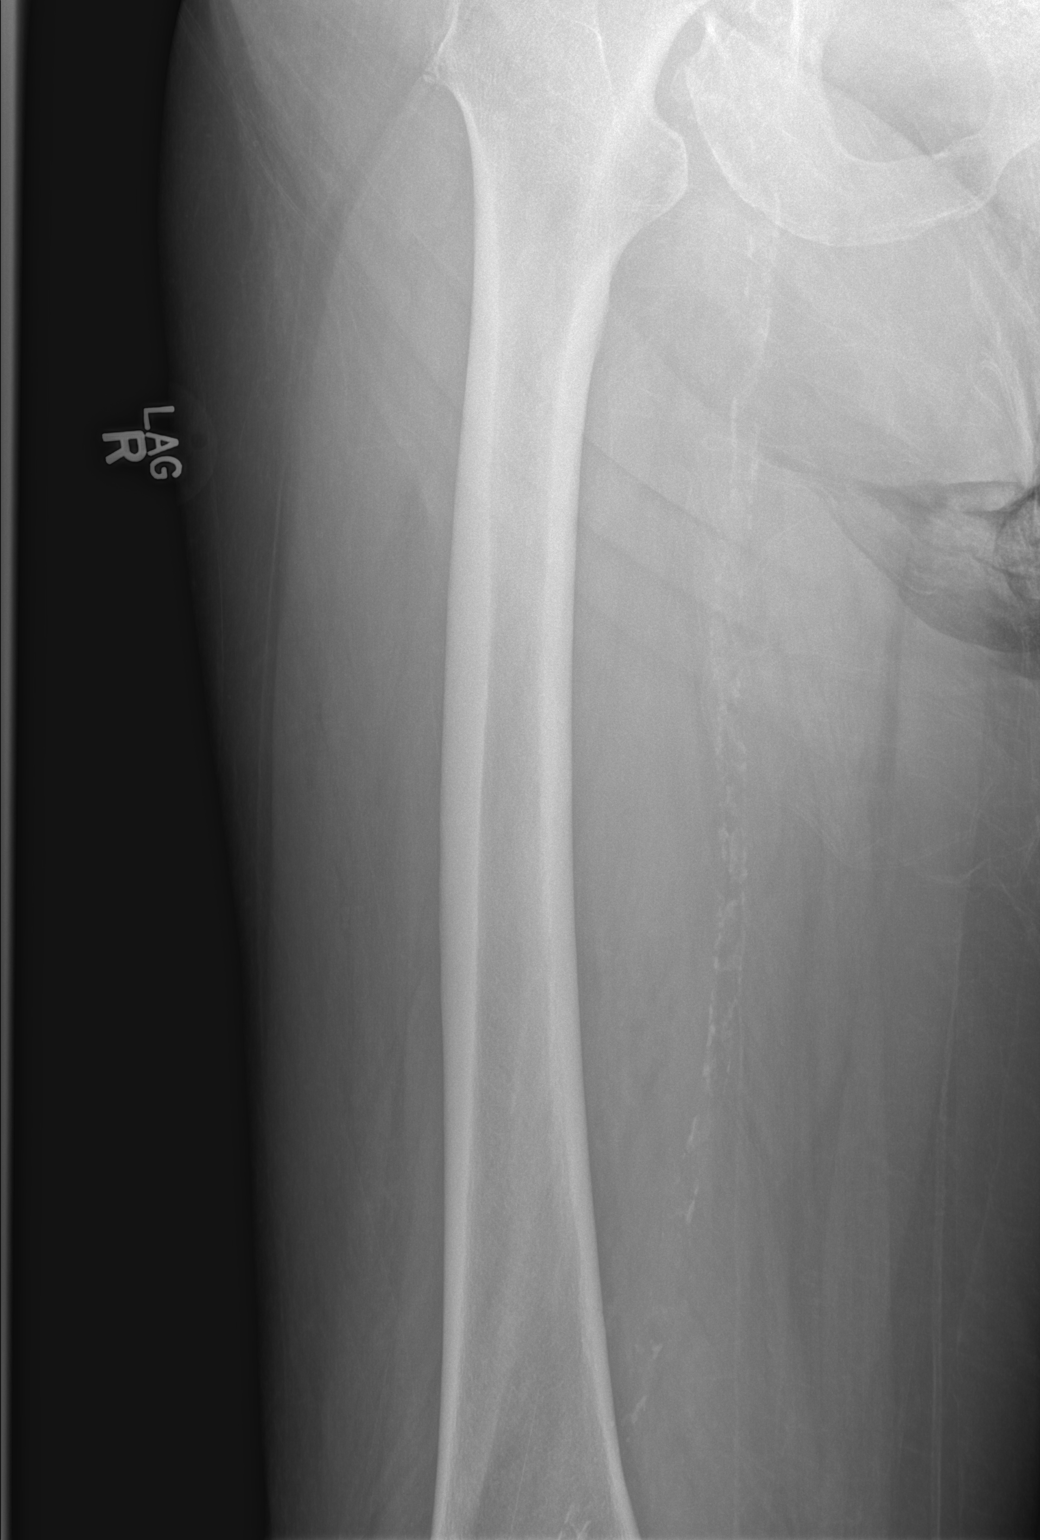

[t femur distal ap right]
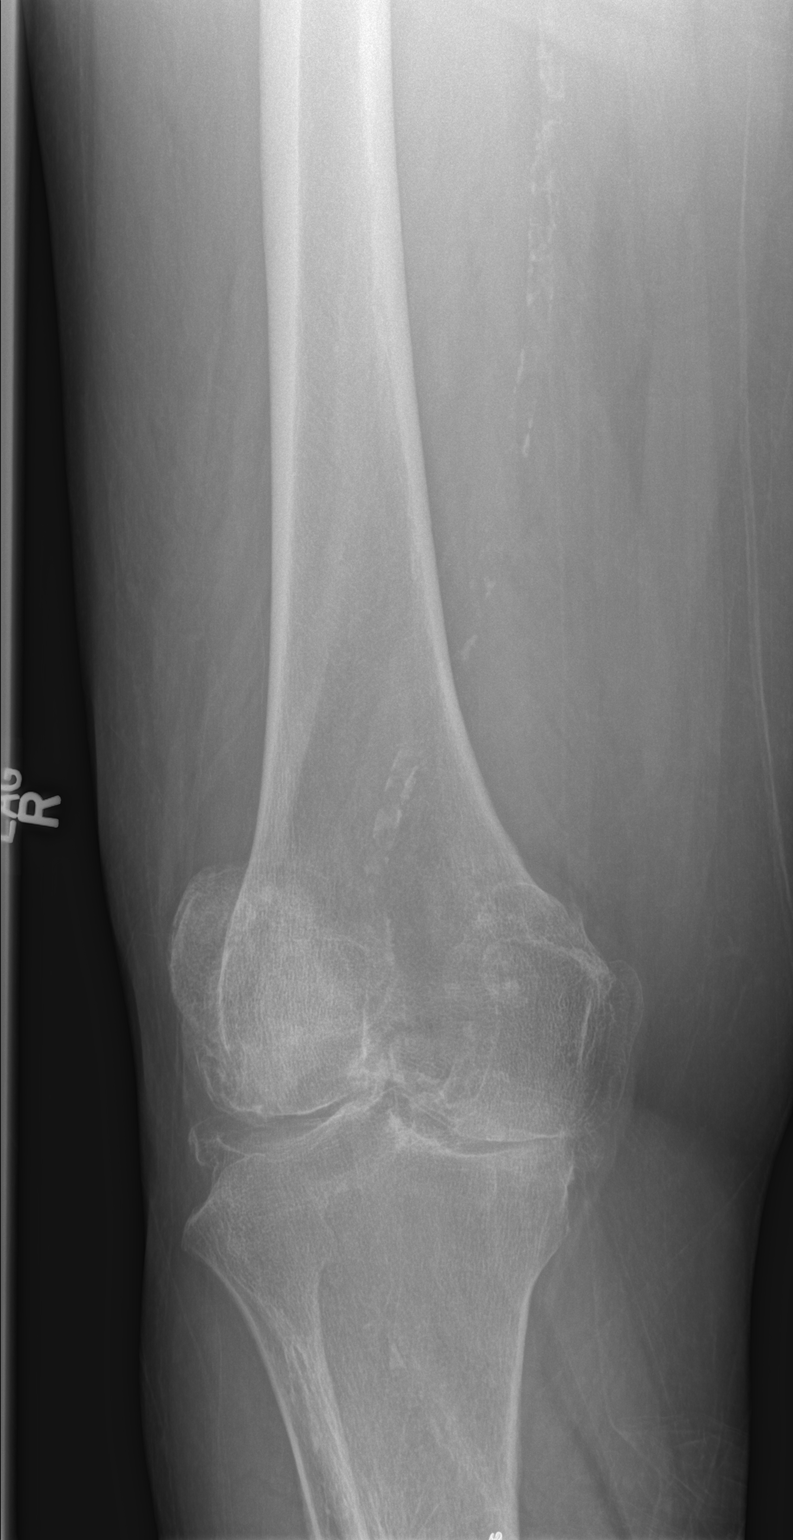

[w femur distal lat right (1 of 2)]
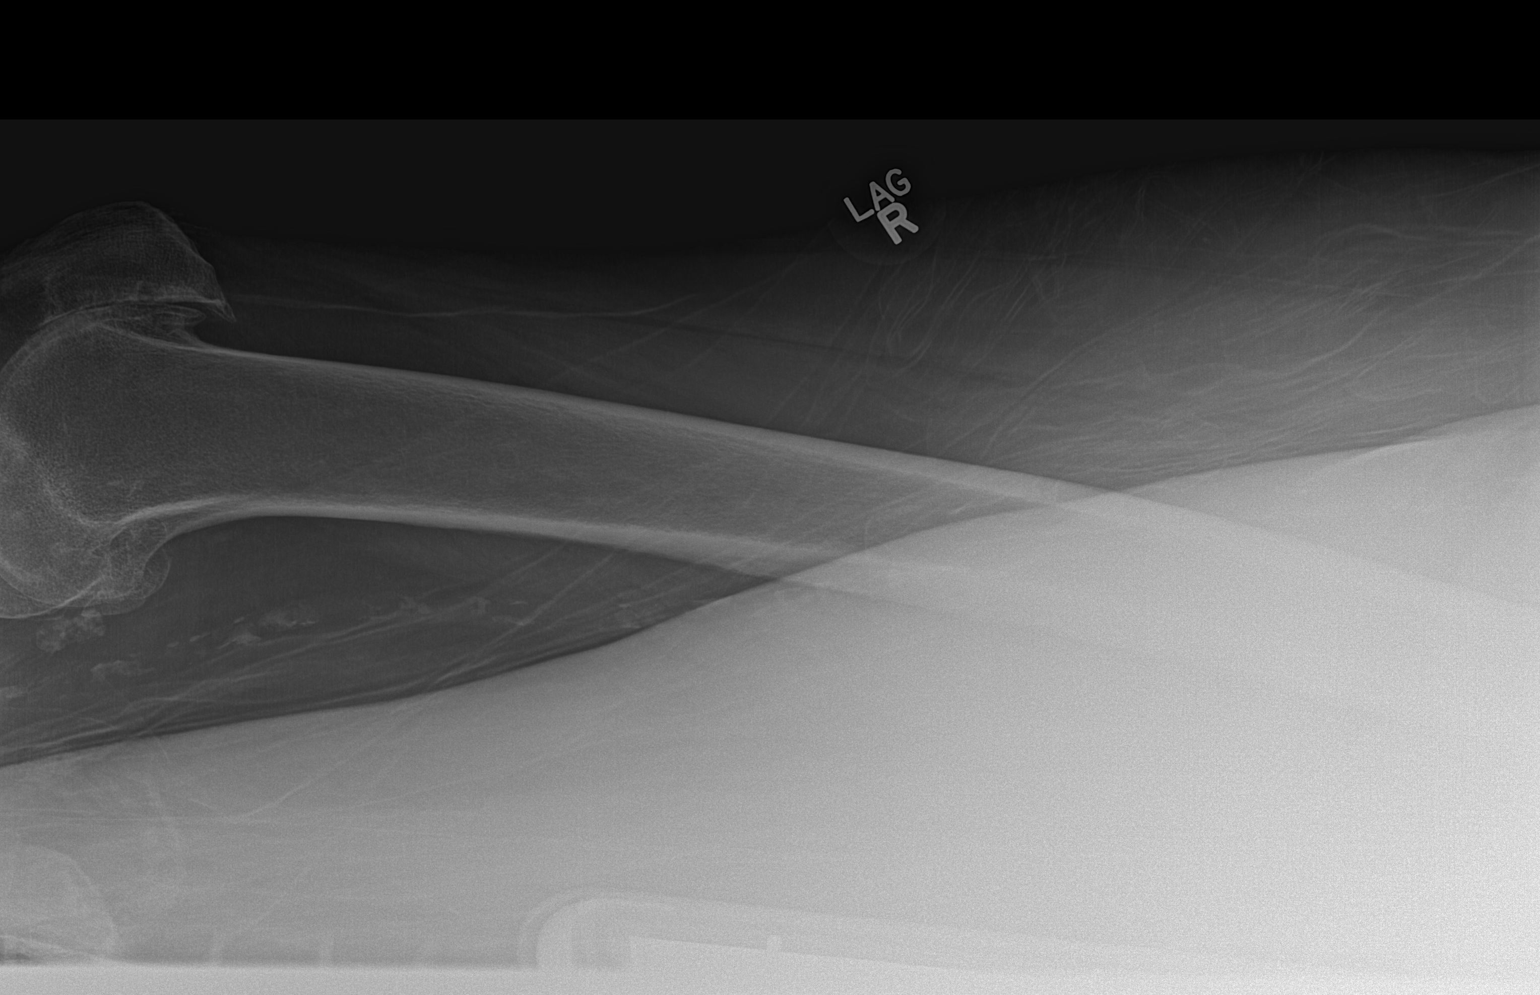

[w femur distal lat right (2 of 2)]
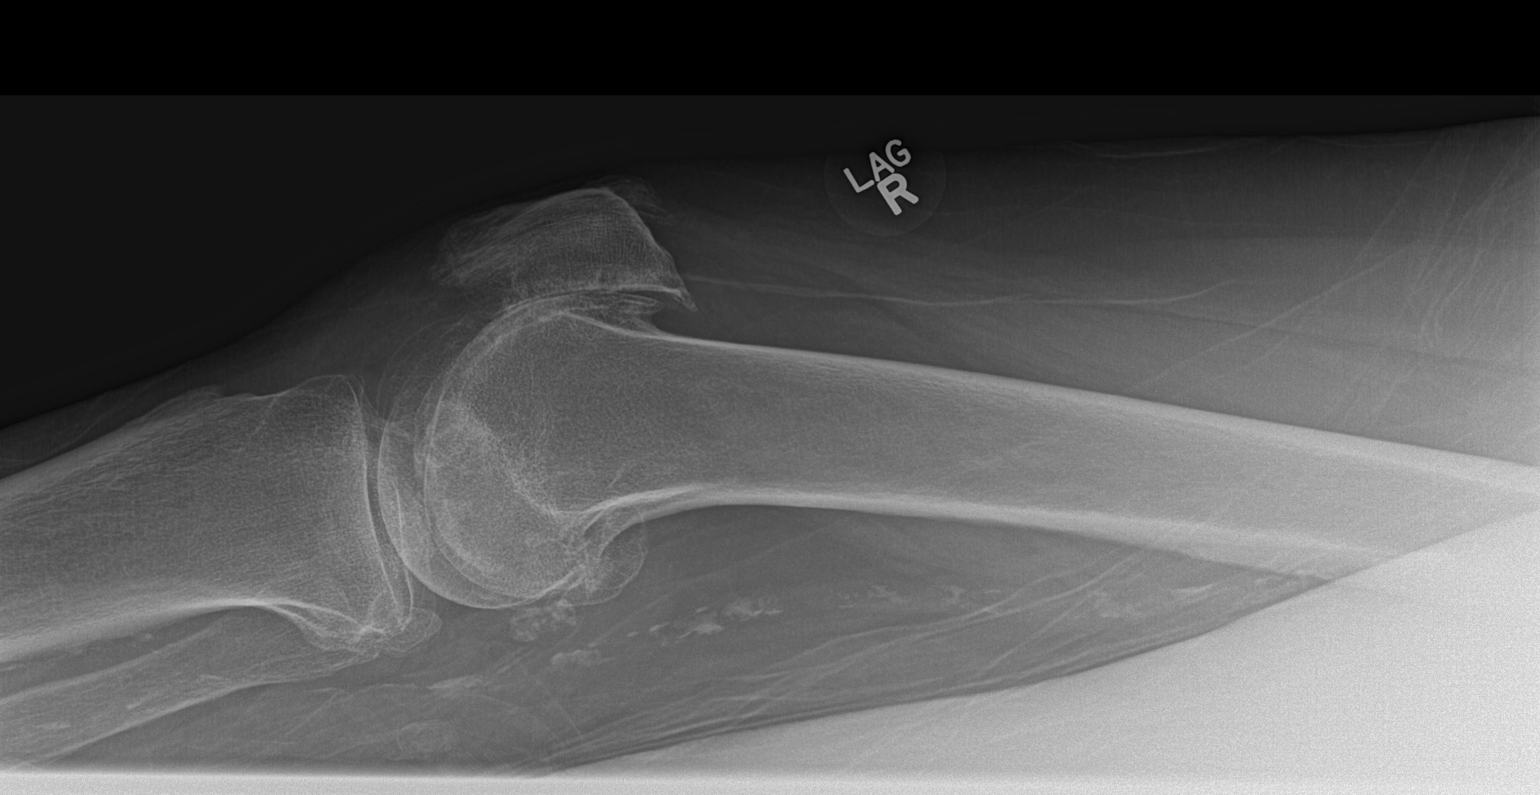

[4 of 4 positions shown; findings below may reference images not displayed]

FINDINGS: There is no acute fracture or dislocation. The bones are osteopenic.
There is severe arthritic changes of the right knee with
tricompartmental narrowing. Mild bilateral hip arthritic changes.
Degenerative changes of the lower lumbar spine. The soft tissues are
unremarkable. Vascular calcifications noted.
IMPRESSION: 1. No acute fracture or dislocation.
2. Osteopenia and arthritic changes.

## 2024-03-30 IMAGING — CR DG HIP (WITH OR WITHOUT PELVIS) 2-3V*R*
3 series · 3 of 3 positions shown · non-contrast
Comparison: None Available.

CLINICAL DATA: Fall and trauma to the right lower extremity.

EXAM:
DG HIP (WITH OR WITHOUT PELVIS) 2-3V RIGHT; RIGHT FEMUR 2 VIEWS

[t pelvis ap (1 of 2)]
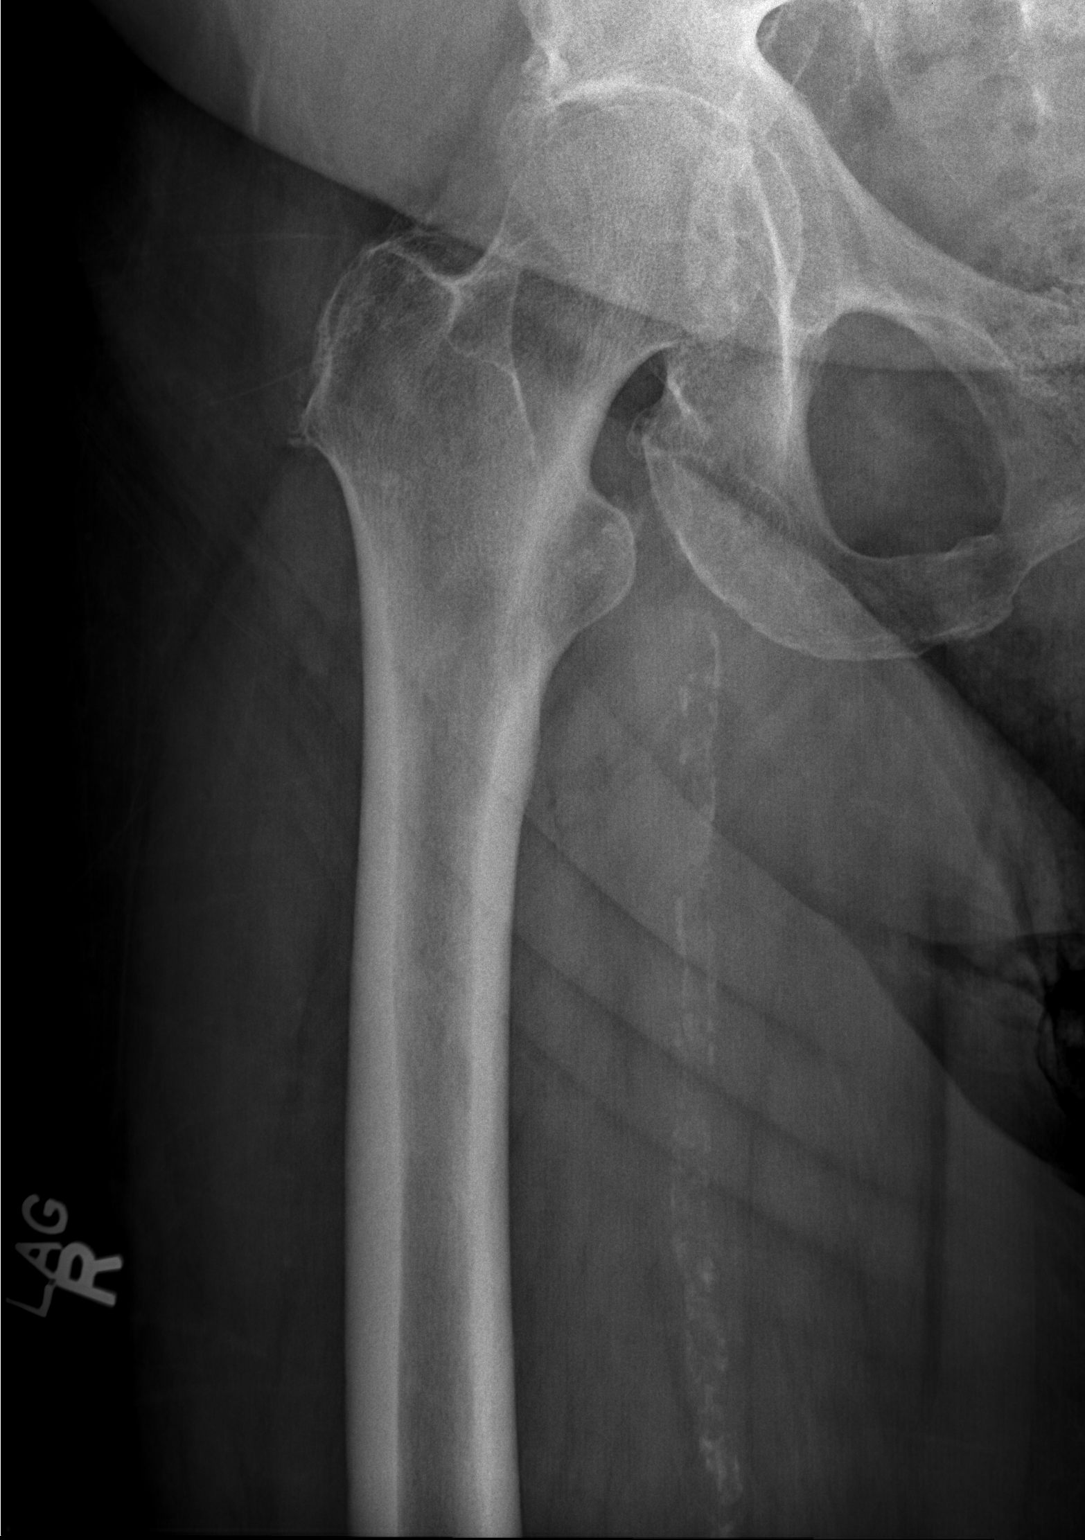

[t pelvis ap (2 of 2)]
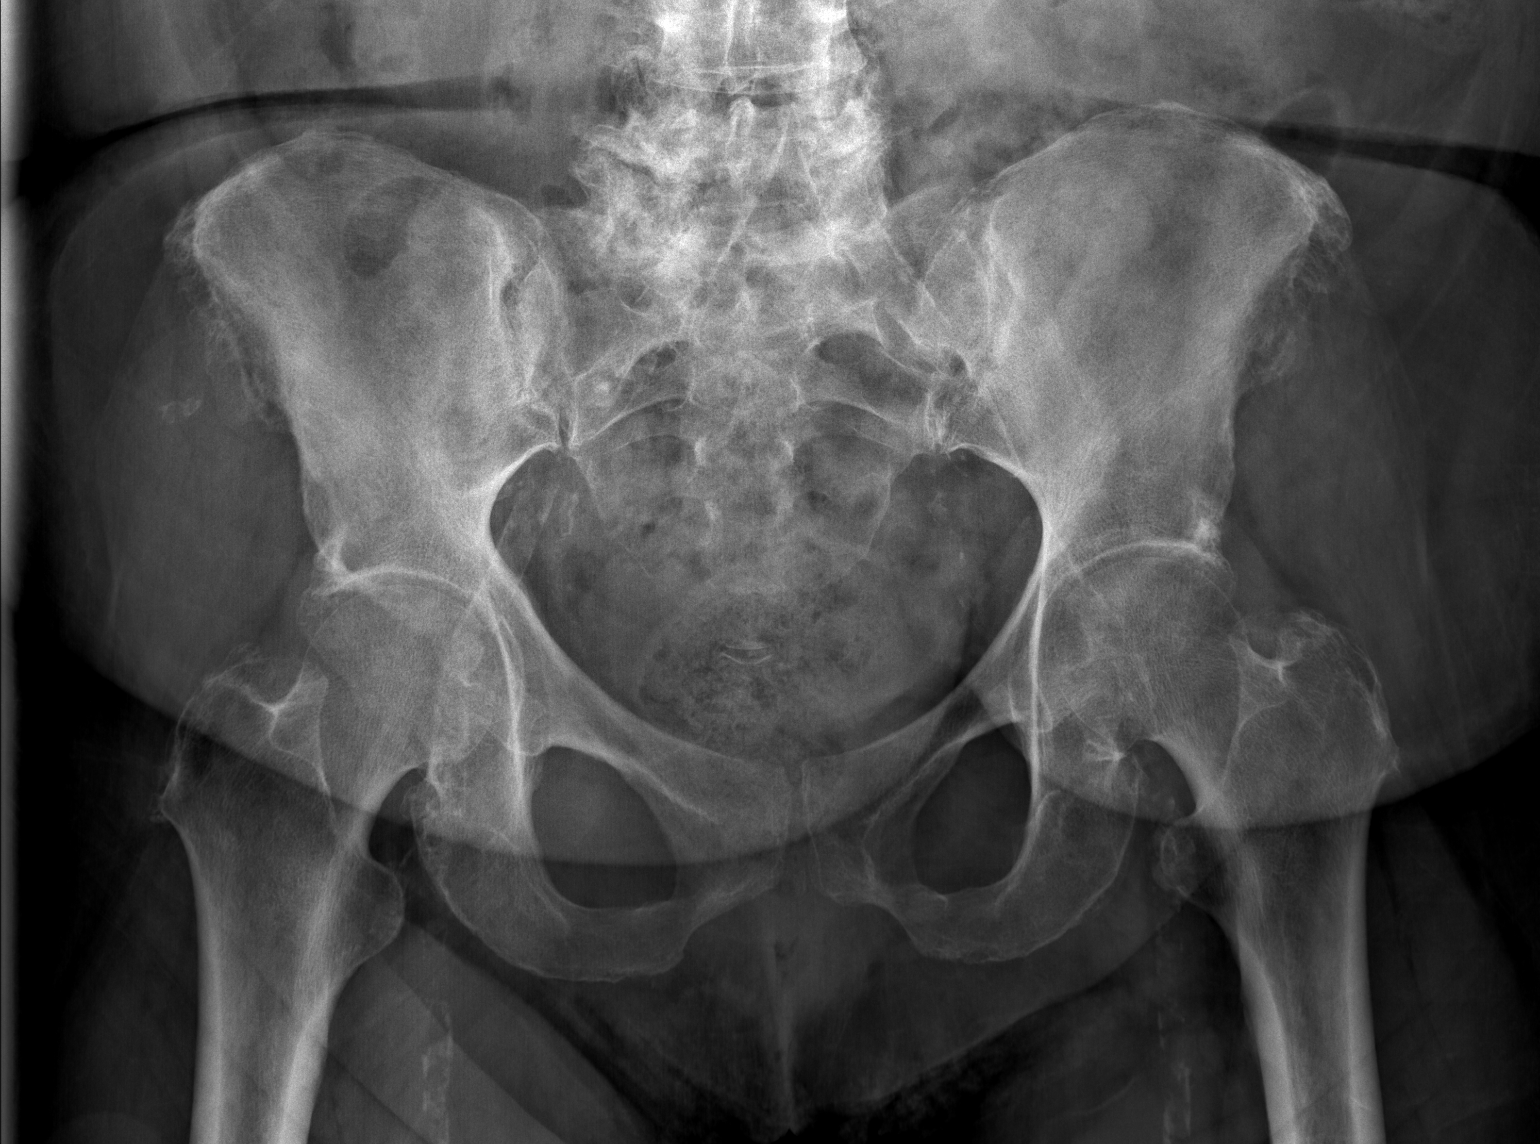

[w hip lat right]
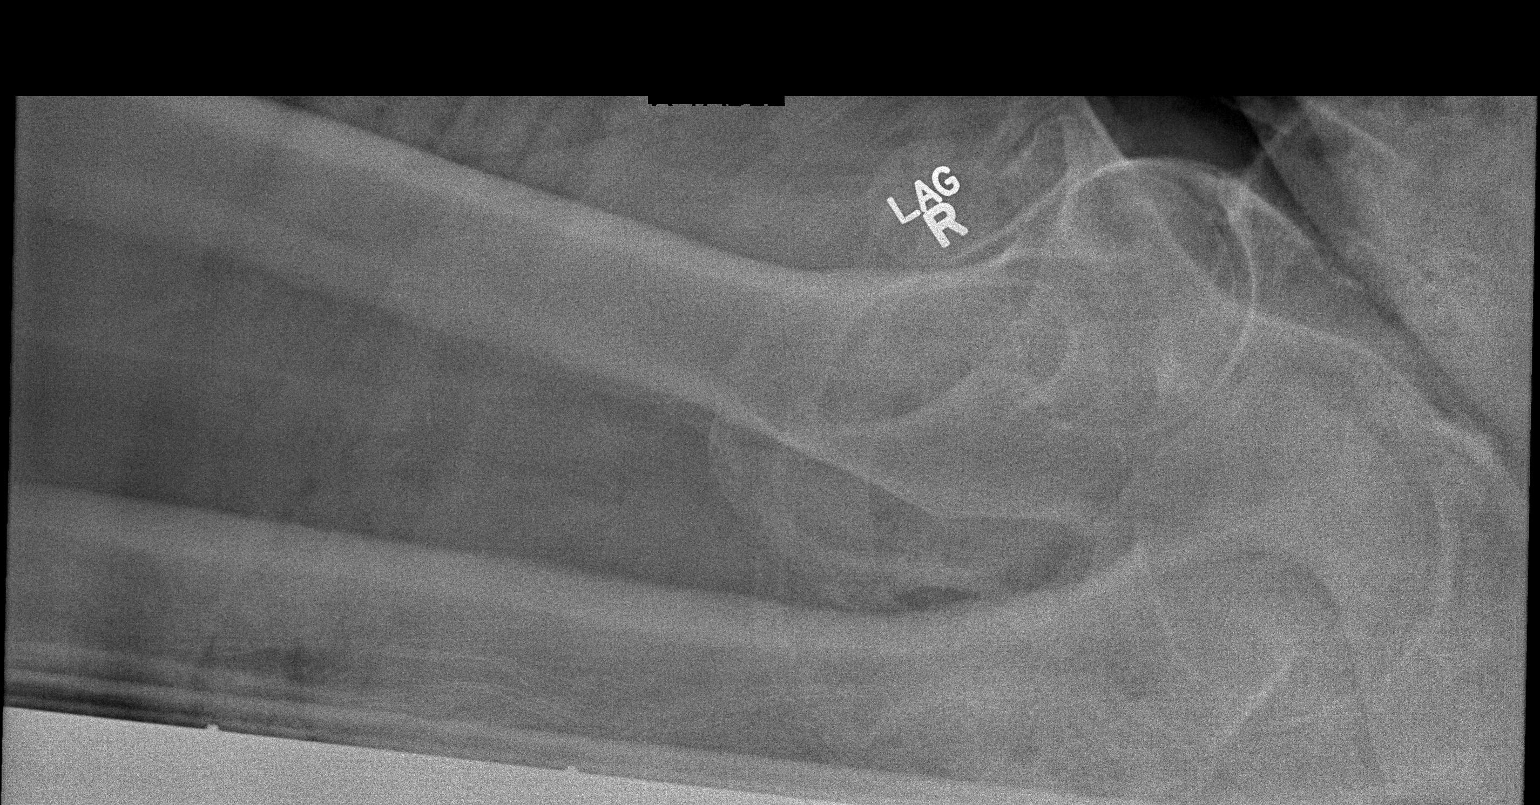

[3 of 3 positions shown; findings below may reference images not displayed]

FINDINGS: There is no acute fracture or dislocation. The bones are osteopenic.
There is severe arthritic changes of the right knee with
tricompartmental narrowing. Mild bilateral hip arthritic changes.
Degenerative changes of the lower lumbar spine. The soft tissues are
unremarkable. Vascular calcifications noted.
IMPRESSION: 1. No acute fracture or dislocation.
2. Osteopenia and arthritic changes.

## 2024-09-08 ENCOUNTER — Other Ambulatory Visit: Payer: Self-pay | Admitting: Nephrology

## 2024-09-08 DIAGNOSIS — N1832 Chronic kidney disease, stage 3b: Secondary | ICD-10-CM
